# Patient Record
Sex: Male | Born: 1955 | Race: White | Hispanic: No | Marital: Married | Smoking: Former smoker
Health system: Southern US, Community
[De-identification: ages and names within clinical notes are randomized; demographics above are authoritative.]

## PROBLEM LIST (undated history)

## (undated) DIAGNOSIS — J449 Chronic obstructive pulmonary disease, unspecified: Secondary | ICD-10-CM

## (undated) DIAGNOSIS — M797 Fibromyalgia: Secondary | ICD-10-CM

## (undated) HISTORY — DX: Chronic obstructive pulmonary disease, unspecified: J44.9

## (undated) HISTORY — DX: Fibromyalgia: M79.7

---

## 1998-01-03 ENCOUNTER — Encounter: Admission: RE | Admit: 1998-01-03 | Discharge: 1998-01-03 | Payer: Self-pay | Admitting: *Deleted

## 2015-08-01 ENCOUNTER — Ambulatory Visit
Admission: RE | Admit: 2015-08-01 | Discharge: 2015-08-01 | Disposition: A | Discharge status: Home / Self Care | Payer: BLUE CROSS/BLUE SHIELD | Source: Ambulatory Visit | Attending: Otolaryngology | Admitting: Otolaryngology

## 2015-08-01 ENCOUNTER — Other Ambulatory Visit: Payer: Self-pay | Admitting: Otolaryngology

## 2015-08-01 DIAGNOSIS — J069 Acute upper respiratory infection, unspecified: Secondary | ICD-10-CM

## 2015-08-01 IMAGING — CR DG CHEST 2V
2 series · 2 of 2 positions shown · non-contrast
Comparison: None in PACs

CLINICAL DATA: Chest skin chest chin and productive cough for the
past week ; also shortness of breath and pleuritic right-sided chest
pain for the past 3 days. Current smoker.

EXAM:
CHEST  2 VIEW

[w chest pa]
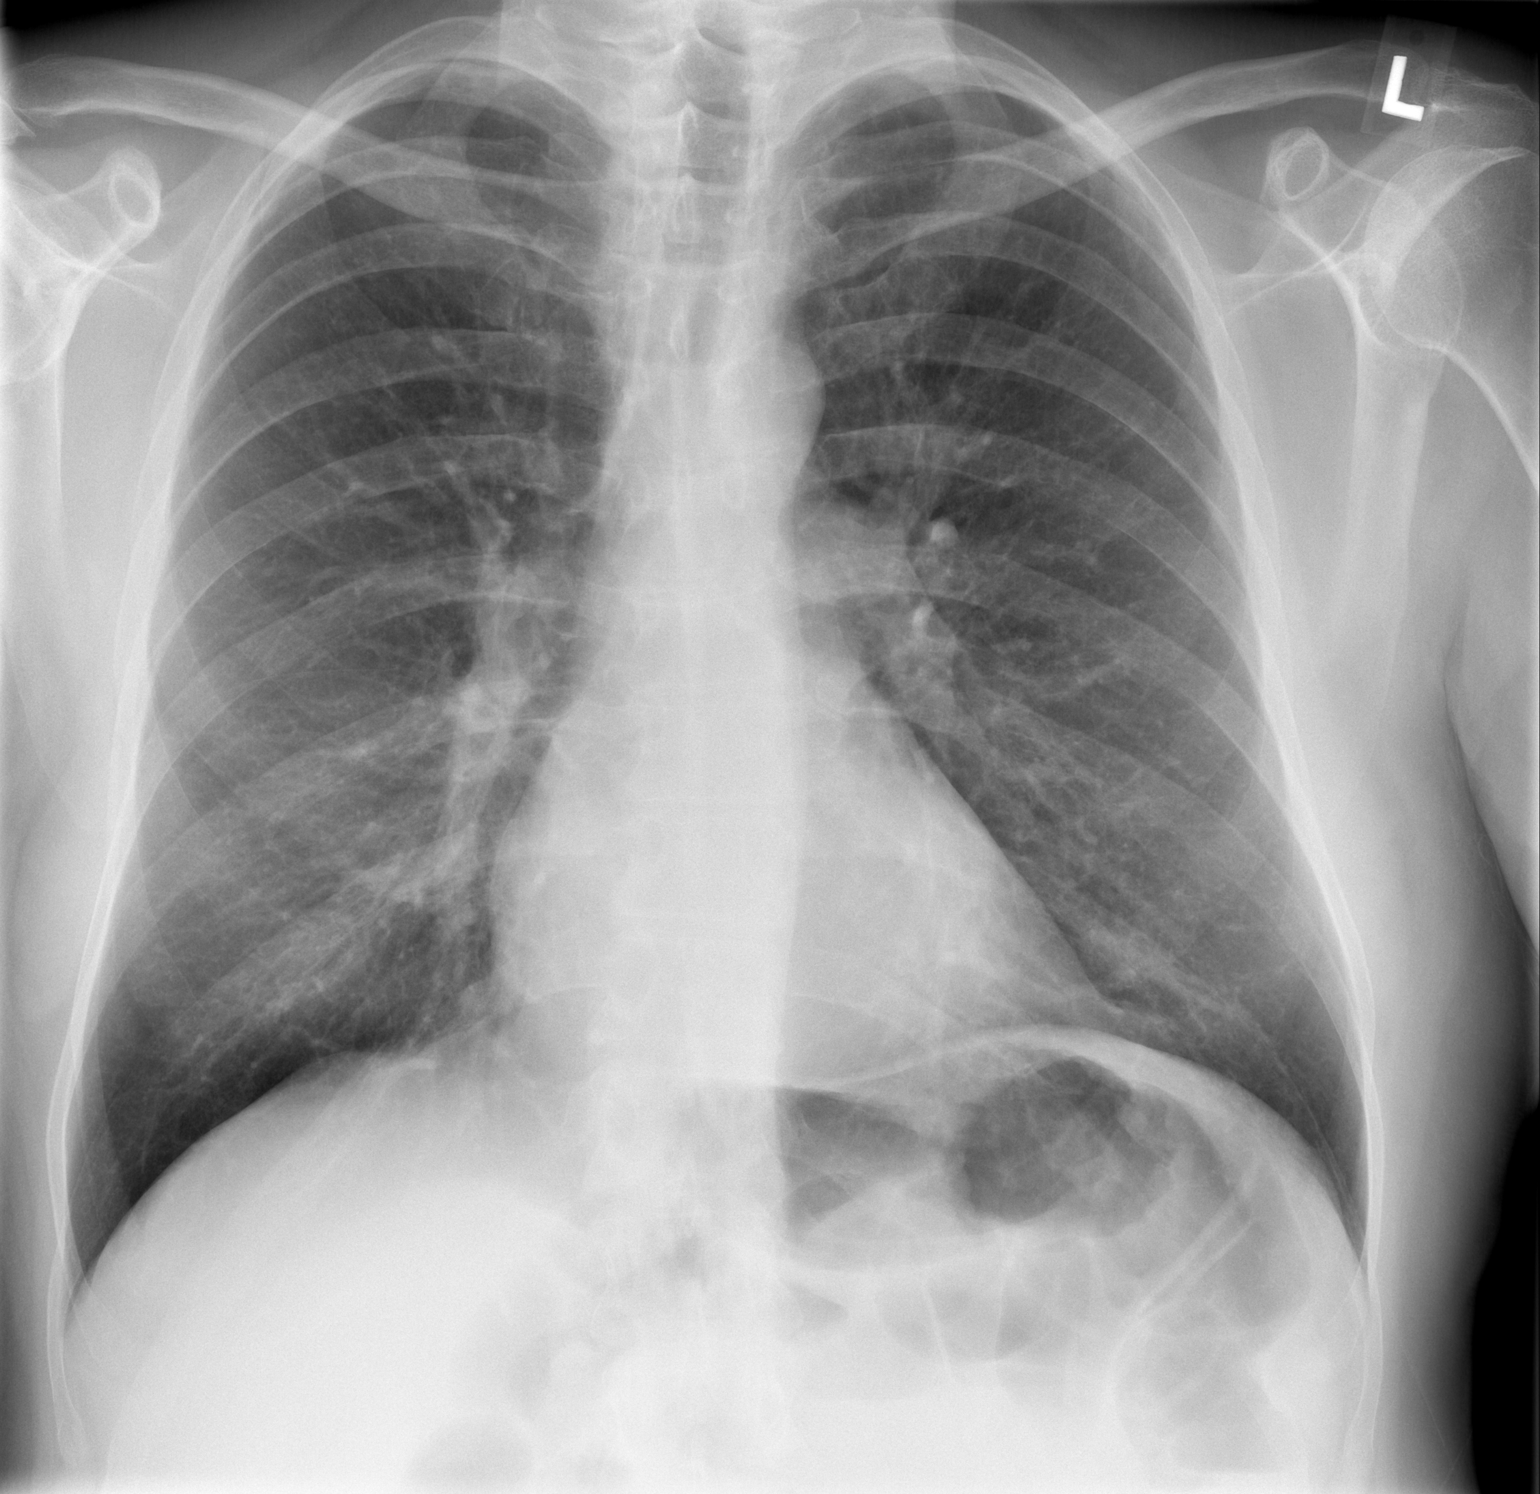

[w chest lat]
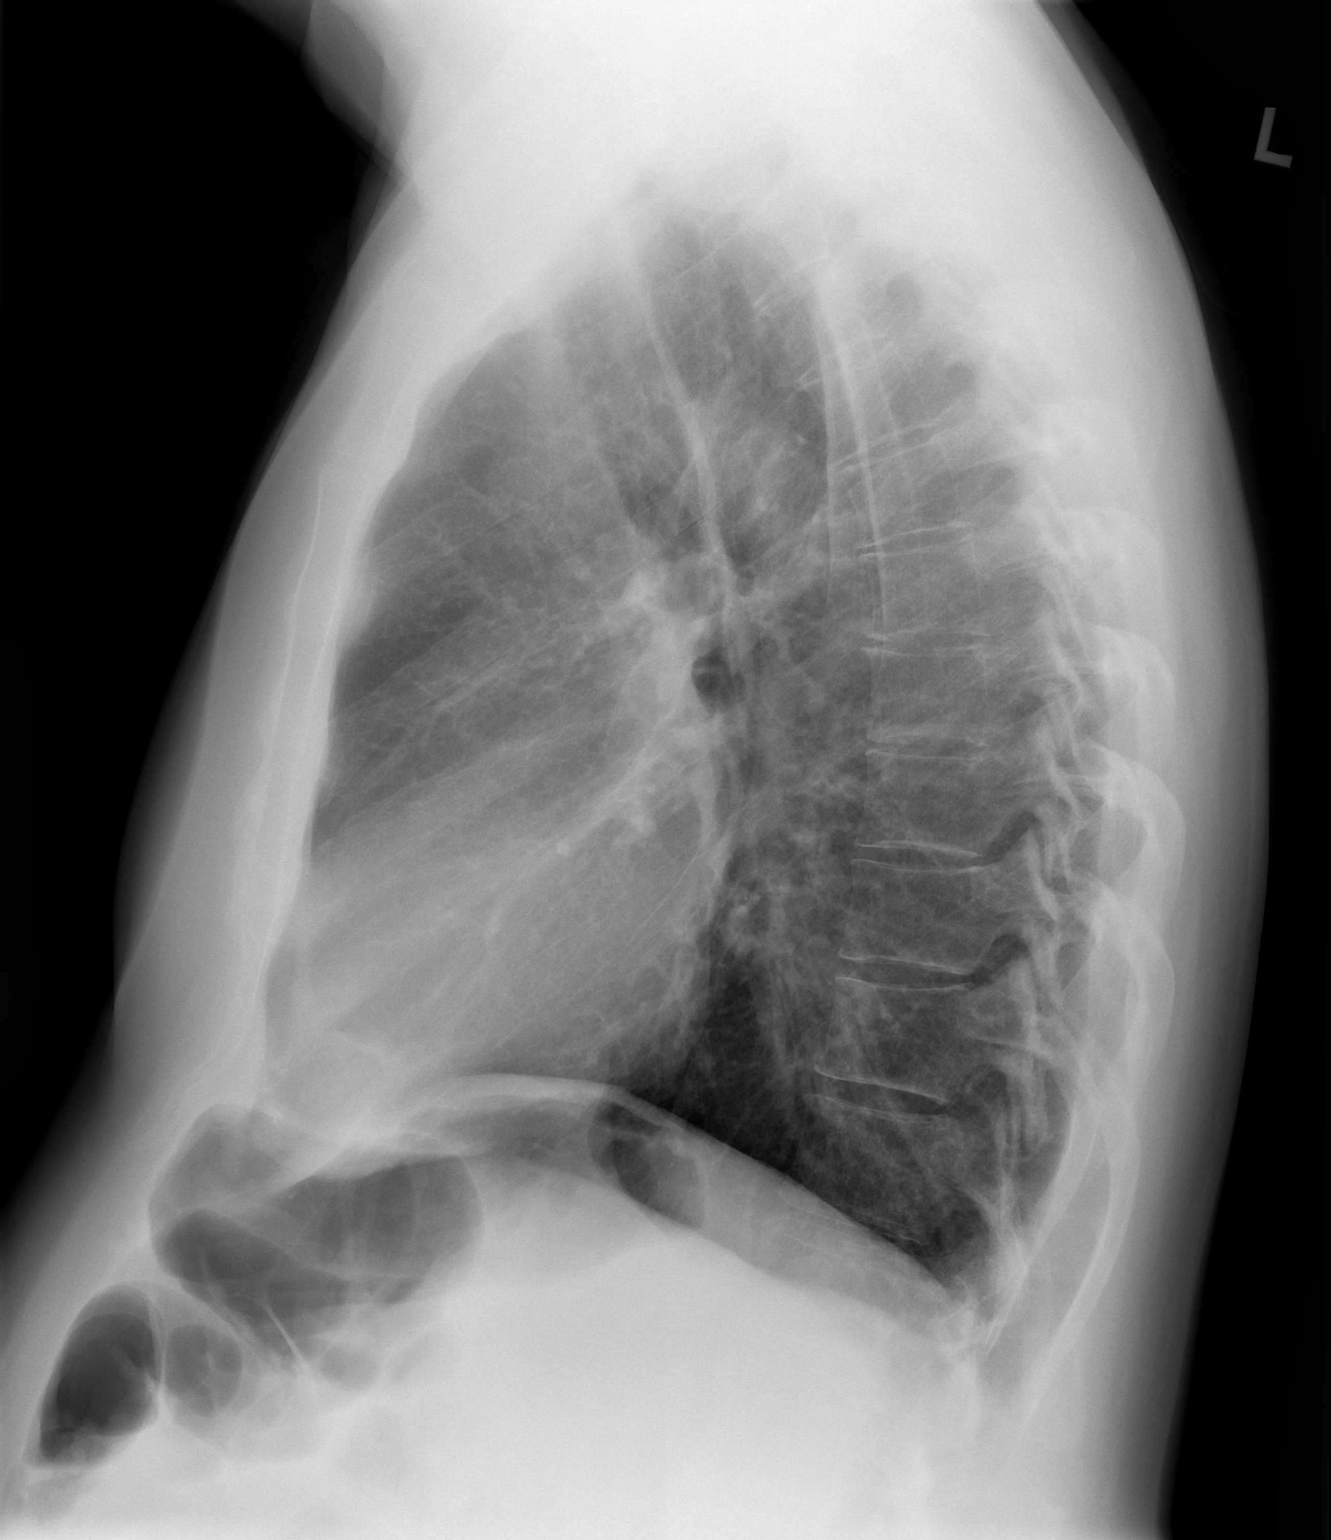

[2 of 2 positions shown; findings below may reference images not displayed]

FINDINGS: The lungs are well-expanded. There are coarse infrahilar lung
markings on the right and lingular density on the left. The heart
and pulmonary vascularity are normal. The mediastinum is normal in
width. There is no pleural effusion. The bony thorax exhibits no
acute abnormality.
IMPRESSION: Lingular and right infrahilar atelectasis or early pneumonia.
Probable underlying COPD.

Followup PA and lateral chest X-ray is recommended in 3-4 weeks
following trial of antibiotic therapy to ensure resolution and
exclude underlying malignancy.

## 2015-08-02 ENCOUNTER — Ambulatory Visit (INDEPENDENT_AMBULATORY_CARE_PROVIDER_SITE_OTHER): Payer: BLUE CROSS/BLUE SHIELD | Admitting: Internal Medicine

## 2015-08-02 ENCOUNTER — Encounter: Payer: Self-pay | Admitting: Internal Medicine

## 2015-08-02 VITALS — BP 114/80 | HR 104 | Ht 73.0 in | Wt 214.4 lb

## 2015-08-02 DIAGNOSIS — R058 Other specified cough: Secondary | ICD-10-CM

## 2015-08-02 DIAGNOSIS — R05 Cough: Secondary | ICD-10-CM

## 2015-08-02 DIAGNOSIS — J189 Pneumonia, unspecified organism: Secondary | ICD-10-CM

## 2015-08-02 DIAGNOSIS — J45901 Unspecified asthma with (acute) exacerbation: Secondary | ICD-10-CM

## 2015-08-02 DIAGNOSIS — F17210 Nicotine dependence, cigarettes, uncomplicated: Secondary | ICD-10-CM

## 2015-08-02 DIAGNOSIS — Z720 Tobacco use: Secondary | ICD-10-CM | POA: Diagnosis not present

## 2015-08-02 MED ORDER — MOMETASONE FURO-FORMOTEROL FUM 100-5 MCG/ACT IN AERO: INHALATION_SPRAY | RESPIRATORY_TRACT | Status: DC

## 2015-08-02 NOTE — Patient Instructions (Addendum)
Finish doxycycline as you plan   Dulera 100 Take 2 puffs first thing in am and then another 2 puffs about 12 hours later.   Work on inhaler technique:  relax and gently blow all the way out then take a nice smooth deep breath back in, triggering the inhaler at same time you start breathing in.  Hold for up to 5 seconds if you can. Blow out thru nose. Rinse and gargle with water when done   For cough > mucinex dm 1200 every 12 hours and rest your voice  The key is to stop smoking completely before smoking completely stops you!   If cough not better > Try prilosec otc 20mg   Take 30-60 min before first meal of the day and Pepcid ac (famotidine) 20 mg one @  bedtime until cough is completely gone for at least a week without the need for cough suppression   GERD (REFLUX)  is an extremely common cause of respiratory symptoms just like yours , many times with no obvious heartburn at all.    It can be treated with medication, but also with lifestyle changes including elevation of the head of your bed (ideally with 6 inch  bed blocks),  Smoking cessation, avoidance of late meals, excessive alcohol, and avoid fatty foods, chocolate, peppermint, colas, red wine, and acidic juices such as orange juice.  NO MINT OR MENTHOL PRODUCTS SO NO COUGH DROPS  USE SUGARLESS CANDY INSTEAD (Jolley ranchers or Stover's or Life Savers) or even ice chips will also do - the key is to swallow to prevent all throat clearing. NO OIL BASED VITAMINS - use powdered substitutes.   Please schedule a follow up office visit in 2  weeks, sooner if needed

## 2015-08-02 NOTE — Progress Notes (Signed)
Subjective:    Patient ID: Erik Chan, male    DOB: 1955/12/17  MRN: [id number]  HPI  63 yowm active smoker and basically healthy until around 2000 with persistent pattern since then of pnds/ sore throats s sob then around 2010 with more voice use noted onset of freqent intermittent "bronchitis" but then acutely worse since 07/23/15 bad cough rx zpak with slt yellow and second dose given by Dr  Haroldine Laws then seen 08/01/15 changed to doxy and referred to pulmonary clinic 08/02/2015 by Dr Haroldine Laws with ? pna/copd  08/02/2015 1st [facility] office visit/ Daylee Delahoz   Chief Complaint  Patient presents with  . Pulmonary Consult    Referred by Dr Hermelinda Medicus. Pt states he was dxed with PNA 08/01/15.  He c/o SOB for the past wk. He also c/o cough and chest congestion- mucinex has helped. Cough is occ prod with yellow to clear sputum.   symptoms of "constant daytime drainage" and throat irritation aggravated by voice use x years but never dx as copd or needed inhalers or really limited by breathing then acutely ill since 07/23/14 hacking cough/ worse hoarseness/ min yellow mucus production not much better with zpak  No obvious other patterns in day to day or daytime variabilty or assoc cp or chest tightness, subjective wheeze overt hb symptoms. No unusual exp hx or h/o childhood pna/ asthma or knowledge of premature birth.  Sleeping ok without nocturnal  or early am exacerbation  of respiratory  c/o's or need for noct saba. Also denies any obvious fluctuation of symptoms with weather or environmental changes or other aggravating or alleviating factors except as outlined above   Current Medications, Allergies, Complete Past Medical History, Past Surgical History, Family History, and Social History were reviewed in [employer] record.           Review of Systems  Constitutional: Negative for fever, chills, activity change, appetite change and unexpected weight change.    HENT: Positive for congestion and sneezing. Negative for dental problem, postnasal drip, rhinorrhea, sore throat, trouble swallowing and voice change.   Eyes: Negative for visual disturbance.  Respiratory: Positive for cough and shortness of breath. Negative for choking.   Cardiovascular: Negative for chest pain and leg swelling.  Gastrointestinal: Negative for nausea, vomiting and abdominal pain.  Genitourinary: Negative for difficulty urinating.  Musculoskeletal: Negative for arthralgias.  Skin: Negative for rash.  Psychiatric/Behavioral: Negative for behavioral problems and confusion.       Objective:   Physical Exam  amb wm nad very talkative and likes to use medical terms  Wt Readings from Last 3 Encounters:  08/02/15 214 lb 6.4 oz (97.251 kg)    Vital signs reviewed   HEENT: nl dentition, turbinates, and oropharynx. Nl external ear canals without cough reflex   NECK :  without JVD/Nodes/TM/ nl carotid upstrokes bilaterally   LUNGS: no acc muscle use,  Nl contour chest with late exp bilateral rhonchi/coughing fits  CV:  RRR  no s3 or murmur or increase in P2, no edema   ABD:  soft and nontender with nl inspiratory excursion in the supine position. No bruits or organomegaly, bowel sounds nl  MS:  Nl gait/ ext warm without deformities, calf tenderness, cyanosis or clubbing No obvious joint restrictions   SKIN: warm and dry without lesions    NEURO:  alert, approp, nl sensorium with  no motor deficits      I personally reviewed images and agree with radiology impression as follows:  CXR: 08/01/15 Lingular and right infrahilar atelectasis or early pneumonia. Probable underlying COPD.      Assessment & Plan:

## 2015-08-03 DIAGNOSIS — R058 Other specified cough: Secondary | ICD-10-CM | POA: Insufficient documentation

## 2015-08-03 DIAGNOSIS — J45901 Unspecified asthma with (acute) exacerbation: Secondary | ICD-10-CM | POA: Insufficient documentation

## 2015-08-03 DIAGNOSIS — J189 Pneumonia, unspecified organism: Secondary | ICD-10-CM | POA: Insufficient documentation

## 2015-08-03 DIAGNOSIS — R05 Cough: Secondary | ICD-10-CM | POA: Insufficient documentation

## 2015-08-03 DIAGNOSIS — F17210 Nicotine dependence, cigarettes, uncomplicated: Secondary | ICD-10-CM | POA: Insufficient documentation

## 2015-08-03 NOTE — Assessment & Plan Note (Signed)
Active smoker with recurrent "bronchitis" now with exp rhonchi on exam but really very little at this point to support sign copd > rec complete the doxy/ add low dose ics = dulera 100 2bid and regroup in 2 weeks  - The proper method of use, as well as anticipated side effects, of a metered-dose inhaler are discussed and demonstrated to the patient. Improved effectiveness after extensive coaching during this visit to a level of approximately 75 % from a baseline of 50 % \  Total time devoted to counseling  = 35/93m review case with pt/ discussion of options/alternatives/ personally creating written instructions  in presence of pt  then going over those specific  Instructions directly with the pt including how to use all of the meds but in particular covering each new medication in detail and the difference between the maintenance/automatic meds and the prns using an action plan format for the latter.

## 2015-08-03 NOTE — Assessment & Plan Note (Signed)

## 2015-08-03 NOTE — Assessment & Plan Note (Signed)
Onset 07/23/15  With RML/lingular linear densities only   This is not classic pna/ doxy should be fine with f/u cxr planned  Reviewed anatomic issues which limit rapid resolution of infiltrates in this setting

## 2015-08-03 NOTE — Assessment & Plan Note (Signed)
Classic Upper airway cough syndrome, so named because it's frequently impossible to sort out how much is  CR/sinusitis with freq throat clearing (which can be related to primary GERD)   vs  causing  secondary (" extra esophageal")  GERD from wide swings in gastric pressure that occur with throat clearing, often  promoting self use of mint and menthol lozenges that reduce the lower esophageal sphincter tone and exacerbate the problem further in a cyclical fashion.   These are the same pts (now being labeled as having "irritable larynx syndrome" by some cough centers) who not infrequently have a history of having failed to tolerate ace inhibitors,  dry powder inhalers or biphosphonates or report having atypical reflux symptoms that don't respond to standard doses of PPI , and are easily confused as having aecopd or asthma flares by even experienced allergists/ pulmonologists.  Add gerd rx/ acid suppression/ reviewed cyclical cough mechanics and need to suppress throat clearing/ rest voice

## 2015-08-16 ENCOUNTER — Ambulatory Visit: Payer: BLUE CROSS/BLUE SHIELD | Admitting: Internal Medicine

## 2016-03-07 ENCOUNTER — Telehealth: Payer: Self-pay | Admitting: Internal Medicine

## 2016-03-07 NOTE — Telephone Encounter (Signed)
Spoke with the pt  He states needing referral to rheum for fibromyalgia  I advised to call his PCP  He states that he does not have one  I advised he can try and call [place] rhe[facility] and see if they can make the appt himself  He verbalized understanding  Nothing further needed

## 2017-12-11 ENCOUNTER — Ambulatory Visit
Admission: RE | Admit: 2017-12-11 | Discharge: 2017-12-11 | Disposition: A | Discharge status: Home / Self Care | Payer: BLUE CROSS/BLUE SHIELD | Source: Ambulatory Visit | Attending: Family Medicine | Admitting: Family Medicine

## 2017-12-11 ENCOUNTER — Other Ambulatory Visit: Payer: Self-pay | Admitting: Family Medicine

## 2017-12-11 DIAGNOSIS — J209 Acute bronchitis, unspecified: Secondary | ICD-10-CM

## 2017-12-11 IMAGING — CR DG CHEST 2V
2 series · 2 of 2 positions shown · non-contrast
Comparison: [DATE]

CLINICAL DATA: Cough

EXAM:
CHEST - 2 VIEW

[w chest pa]
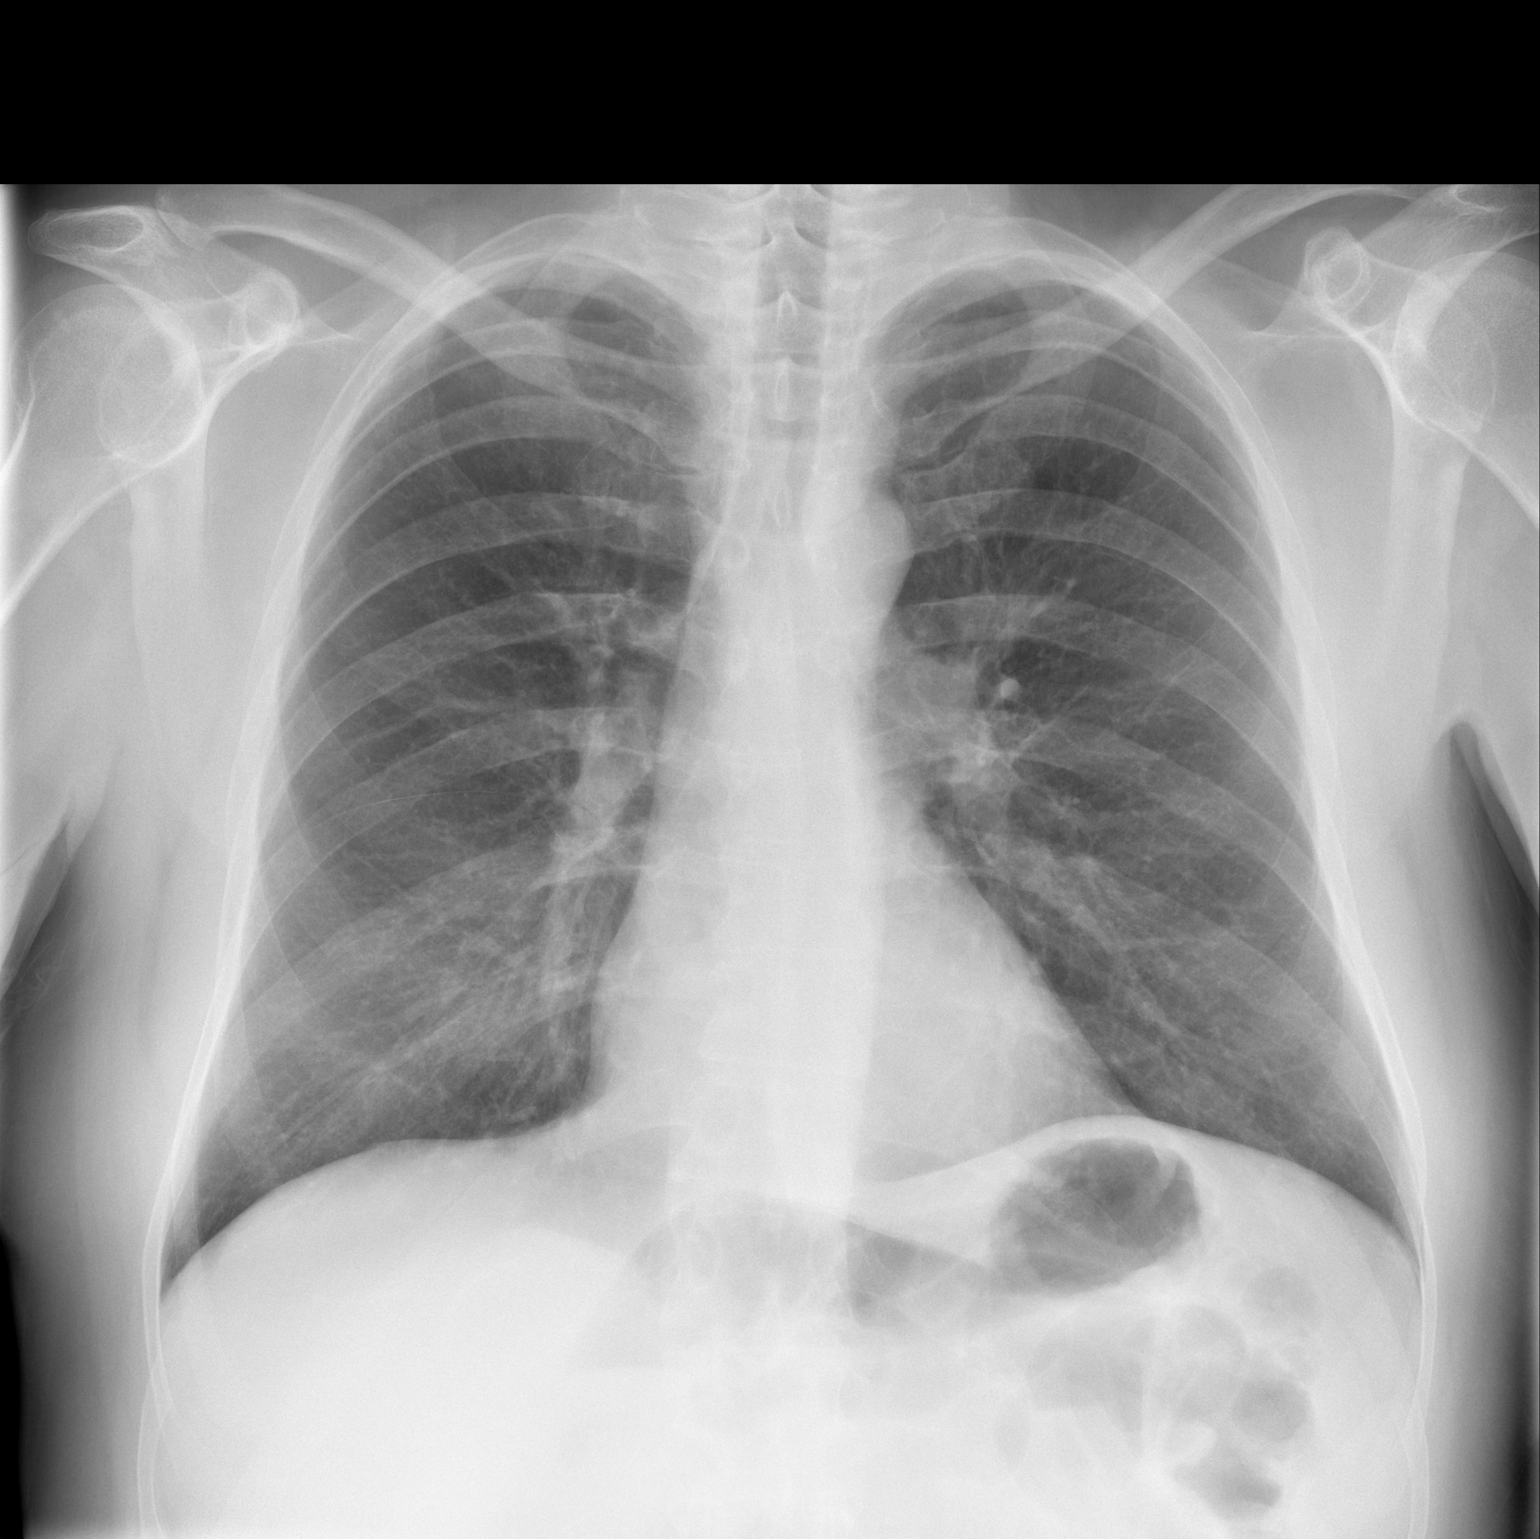

[w chest lat]
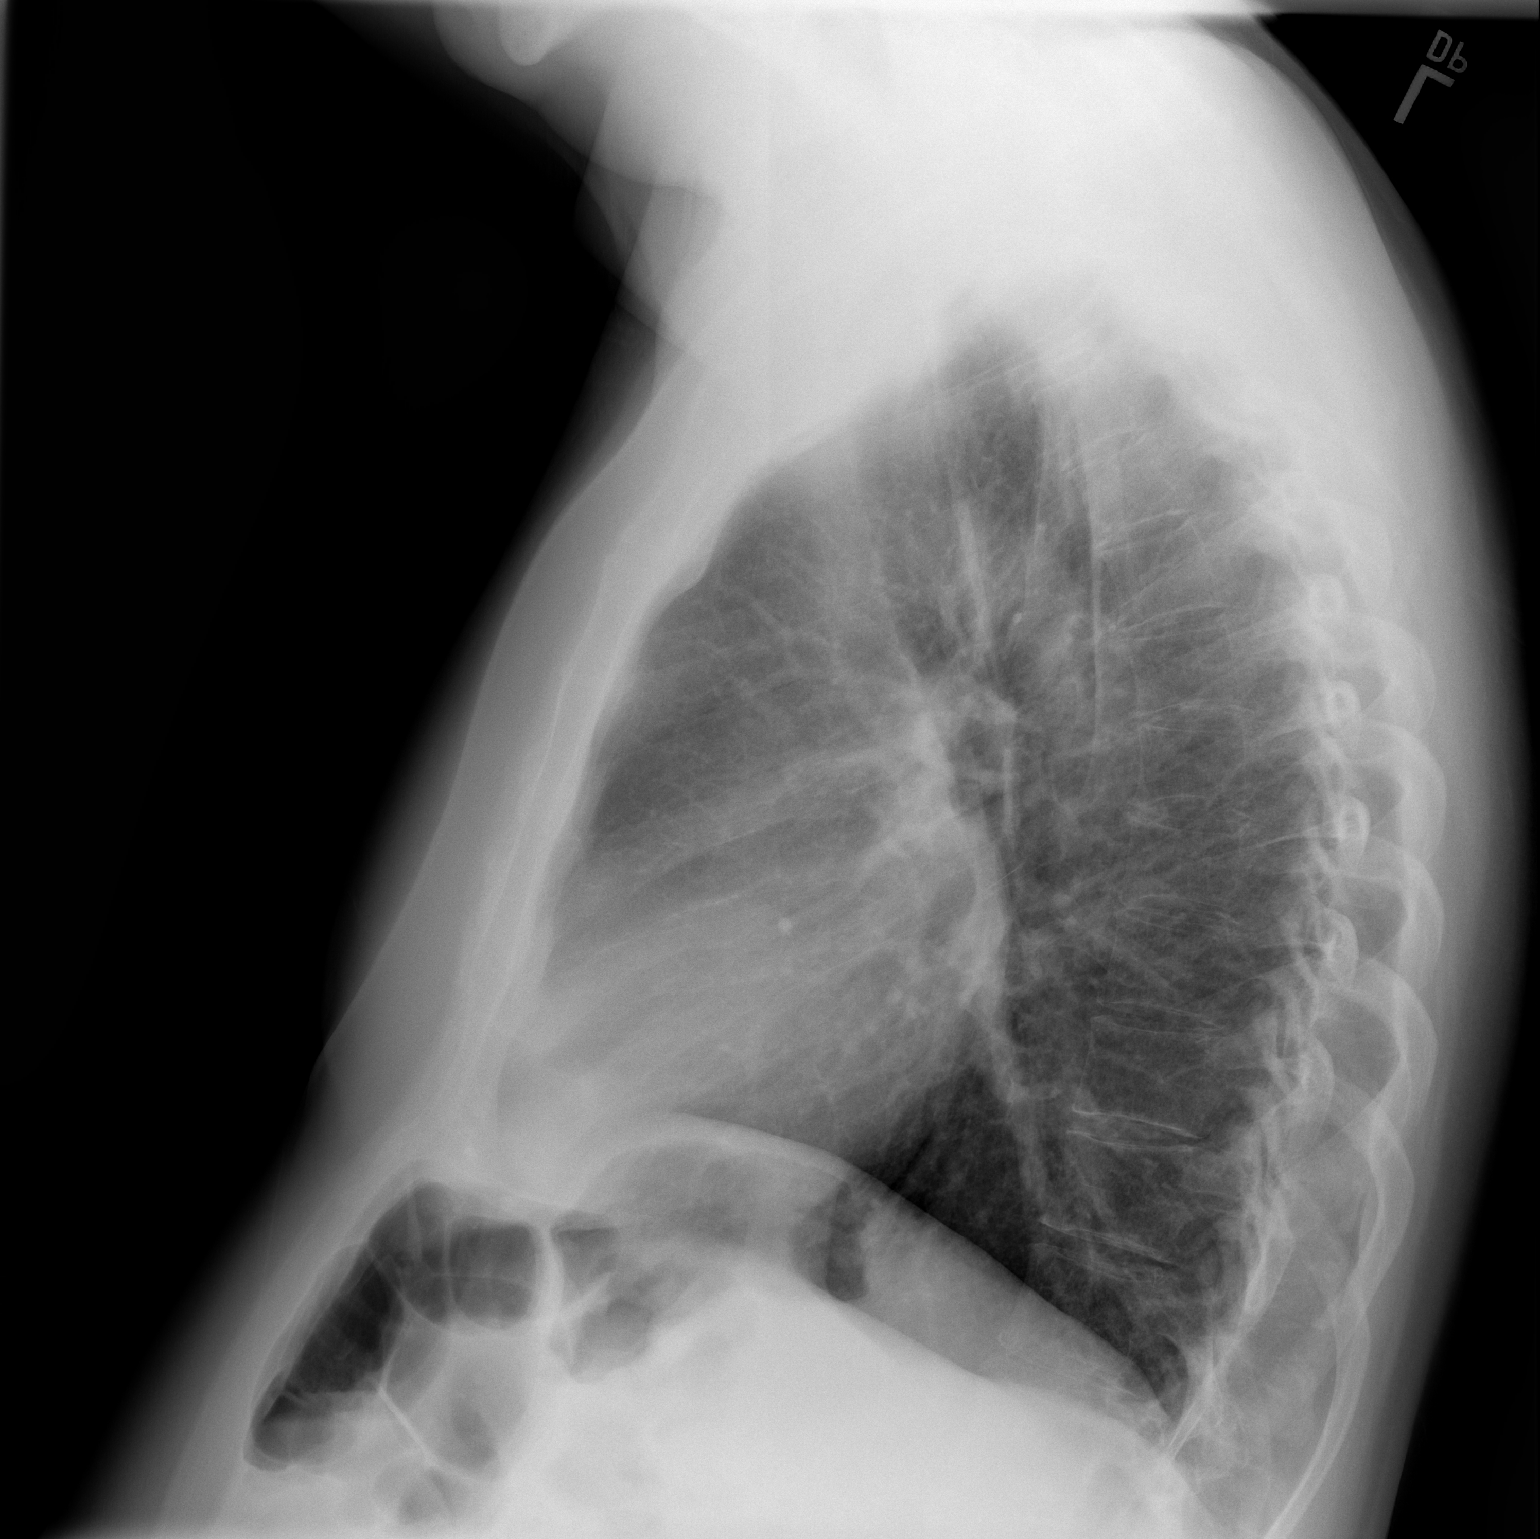

[2 of 2 positions shown; findings below may reference images not displayed]

FINDINGS: There is no edema or consolidation. The heart size and pulmonary
vascularity are normal. No adenopathy. No bone lesions.
IMPRESSION: No edema or consolidation.

## 2021-01-11 DIAGNOSIS — R0602 Shortness of breath: Secondary | ICD-10-CM | POA: Diagnosis not present

## 2021-01-11 DIAGNOSIS — Z8639 Personal history of other endocrine, nutritional and metabolic disease: Secondary | ICD-10-CM | POA: Diagnosis not present

## 2021-01-11 DIAGNOSIS — J449 Chronic obstructive pulmonary disease, unspecified: Secondary | ICD-10-CM | POA: Diagnosis not present

## 2021-01-11 DIAGNOSIS — R059 Cough, unspecified: Secondary | ICD-10-CM | POA: Diagnosis not present

## 2021-01-28 DIAGNOSIS — R609 Edema, unspecified: Secondary | ICD-10-CM | POA: Diagnosis not present

## 2021-01-28 DIAGNOSIS — R0602 Shortness of breath: Secondary | ICD-10-CM | POA: Diagnosis not present

## 2021-01-28 DIAGNOSIS — Z87891 Personal history of nicotine dependence: Secondary | ICD-10-CM | POA: Diagnosis not present

## 2021-01-29 ENCOUNTER — Other Ambulatory Visit: Payer: Self-pay | Admitting: Family Medicine

## 2021-01-29 DIAGNOSIS — R7989 Other specified abnormal findings of blood chemistry: Secondary | ICD-10-CM

## 2021-01-30 ENCOUNTER — Other Ambulatory Visit (HOSPITAL_COMMUNITY): Payer: Self-pay | Admitting: Family Medicine

## 2021-01-30 ENCOUNTER — Ambulatory Visit
Admission: RE | Admit: 2021-01-30 | Discharge: 2021-01-30 | Disposition: A | Discharge status: Home / Self Care | Payer: BLUE CROSS/BLUE SHIELD | Source: Ambulatory Visit | Attending: Family Medicine | Admitting: Family Medicine

## 2021-01-30 ENCOUNTER — Other Ambulatory Visit: Payer: Self-pay

## 2021-01-30 DIAGNOSIS — N5089 Other specified disorders of the male genital organs: Secondary | ICD-10-CM | POA: Diagnosis not present

## 2021-01-30 DIAGNOSIS — R7989 Other specified abnormal findings of blood chemistry: Secondary | ICD-10-CM

## 2021-01-30 DIAGNOSIS — R000 Tachycardia, unspecified: Secondary | ICD-10-CM | POA: Diagnosis not present

## 2021-01-30 DIAGNOSIS — R601 Generalized edema: Secondary | ICD-10-CM | POA: Diagnosis not present

## 2021-01-30 DIAGNOSIS — K8020 Calculus of gallbladder without cholecystitis without obstruction: Secondary | ICD-10-CM | POA: Diagnosis not present

## 2021-01-30 IMAGING — US US ABDOMEN COMPLETE
1 series · 13 of 25 positions shown · non-contrast
Comparison: None.

CLINICAL DATA: Elevated LFTs.  Edema, possible ascites.

EXAM:
ABDOMEN ULTRASOUND COMPLETE

[Series 1: us abdomen complete · 0.23mm/px · 13 of 96 slices shown]
[im 1/96]
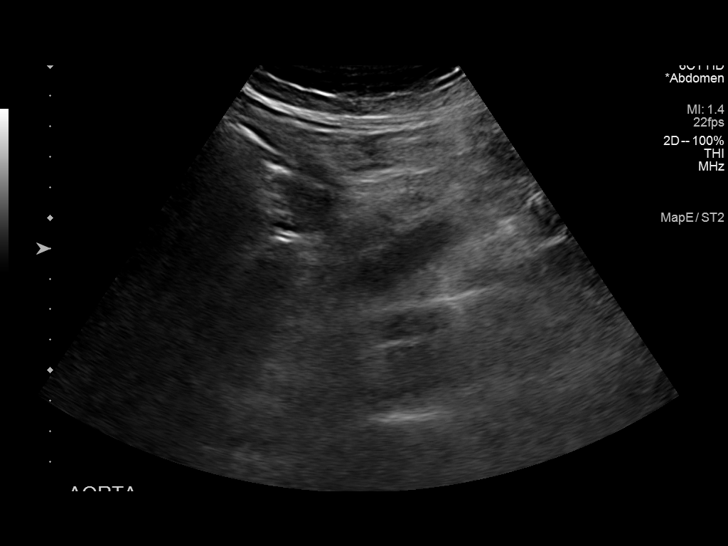
[im 8/96]
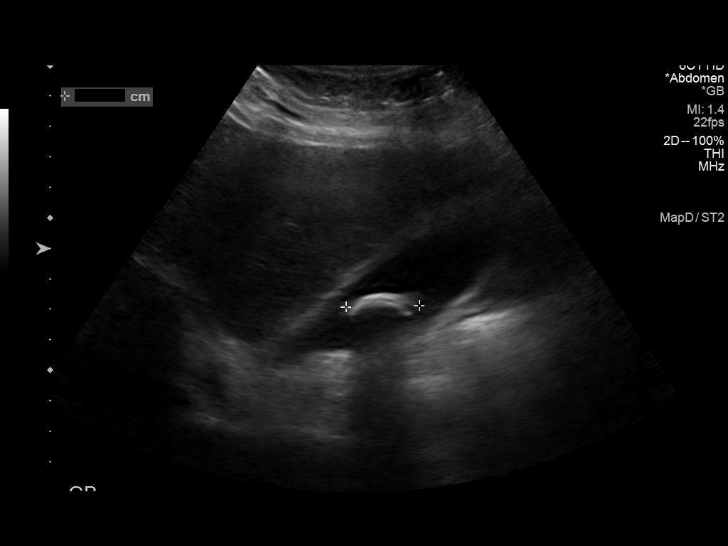
[im 16/96]
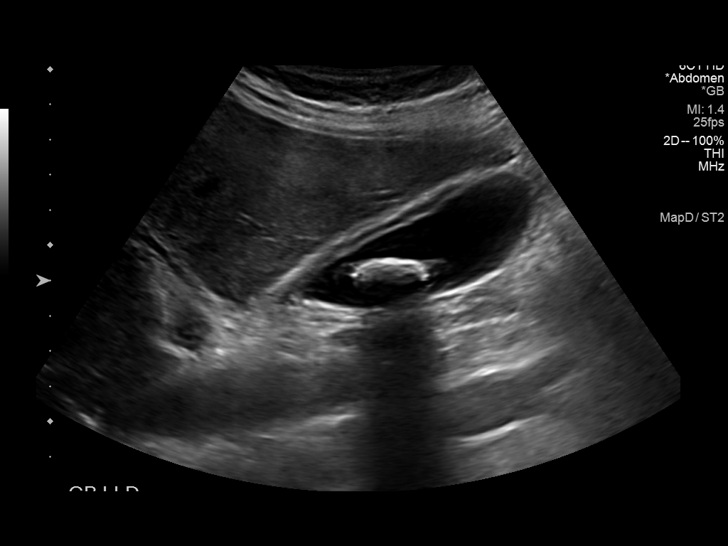
[im 24/96]
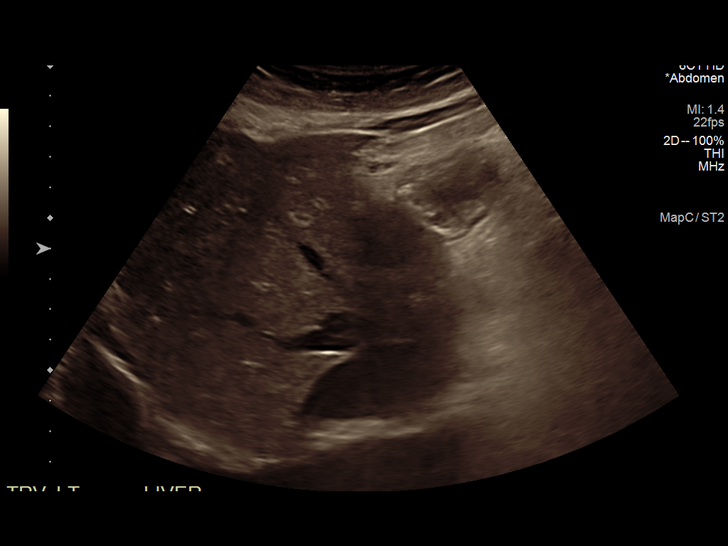
[im 32/96]
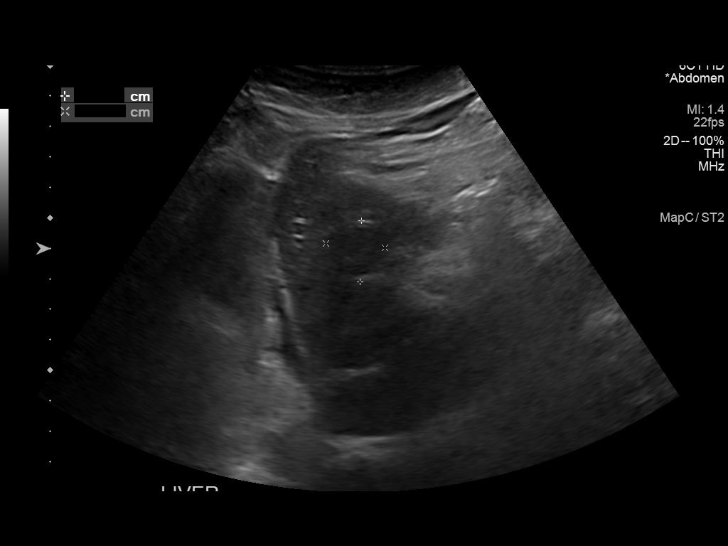
[im 40/96]
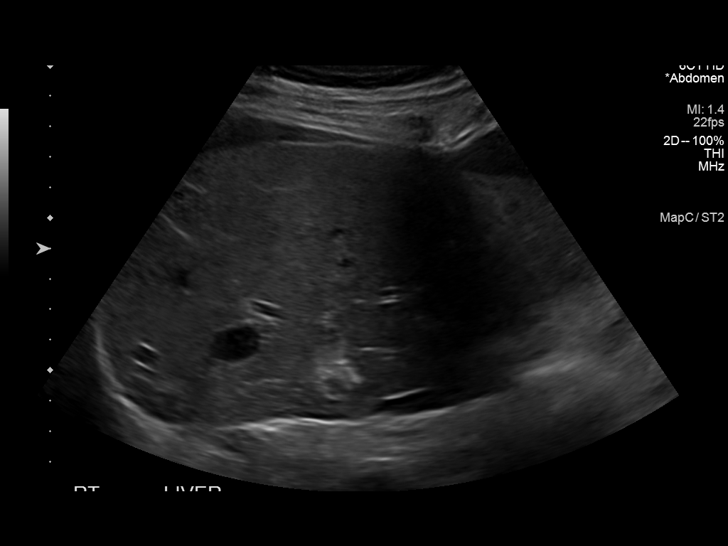
[im 48/96]
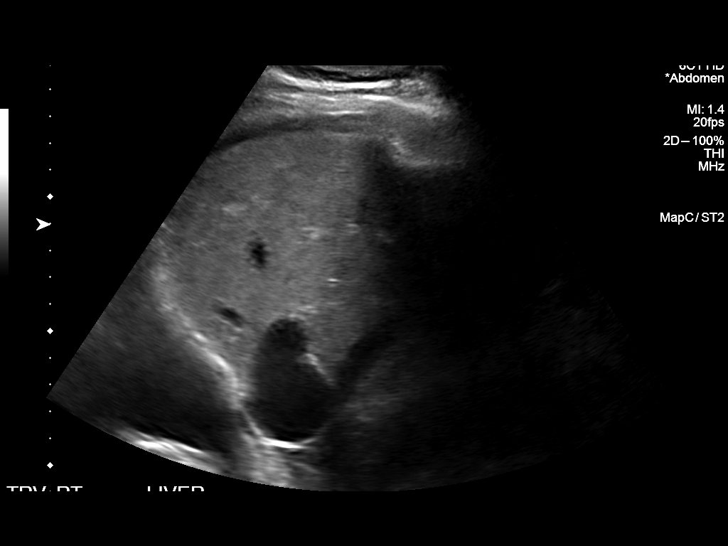
[im 56/96]
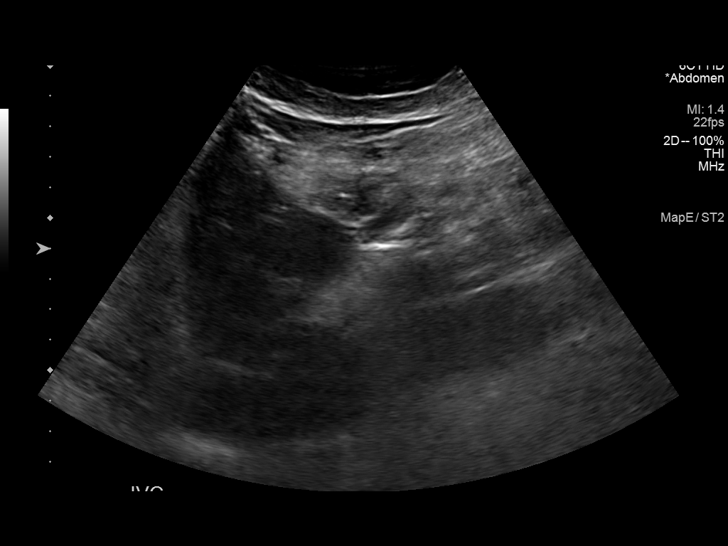
[im 64/96]
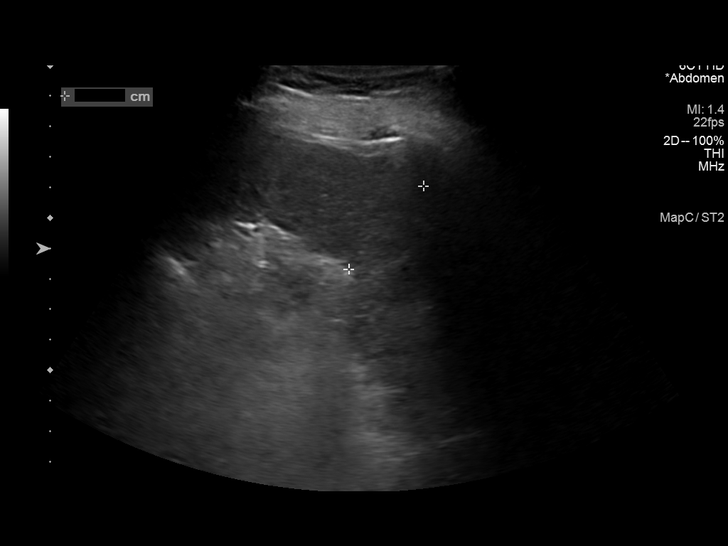
[im 72/96]
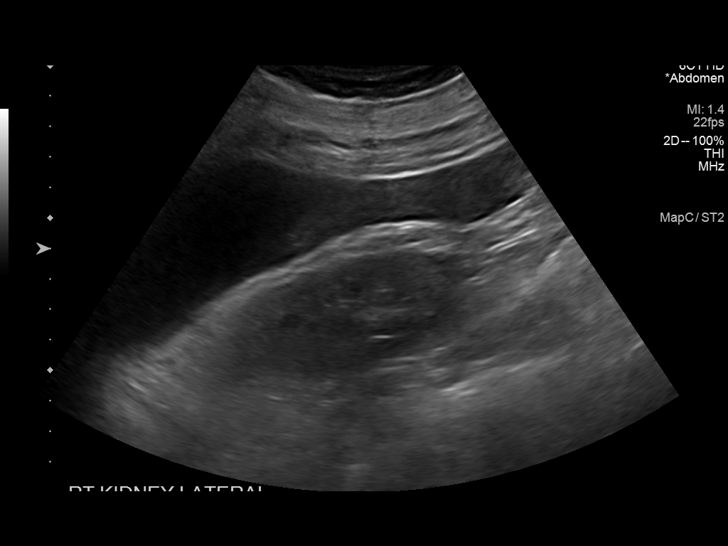
[im 80/96]
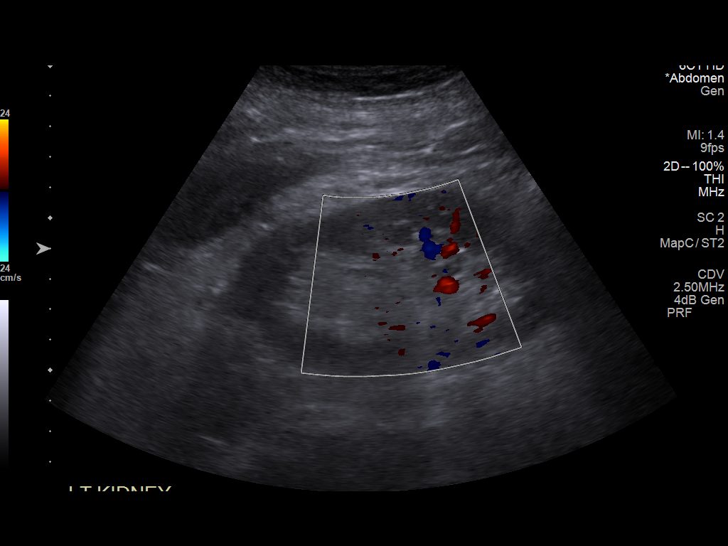
[im 88/96]
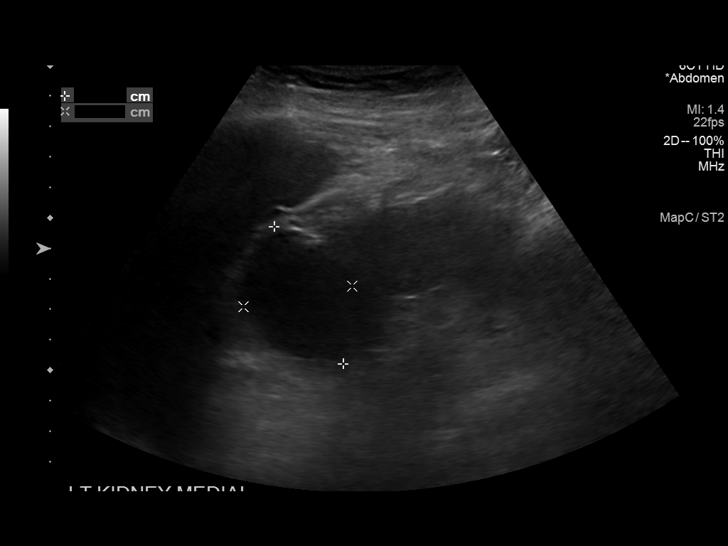
[im 96/96]
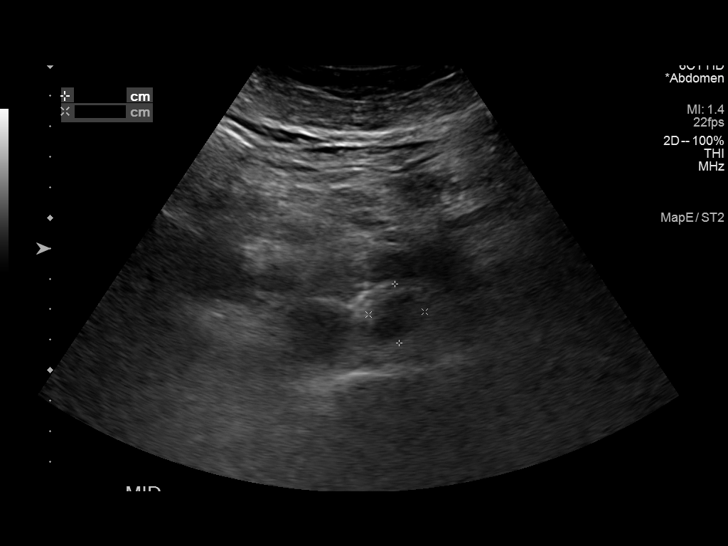

[13 of 25 positions shown; findings below may reference images not displayed]

FINDINGS: Gallbladder: 2.4 cm shadowing gallstone. Focal wall thickening
measuring up to 5 mm, no pericholecystic fluid, sonographic Murphy's
sign was not negative.

Common bile duct: Diameter: 4

Liver: Hypoechoic area measuring 2.0 x 1.9 cm. Within normal limits
in parenchymal echogenicity. Portal vein is patent on color Doppler
imaging with normal direction of blood flow towards the liver.

IVC: No abnormality visualized.

Pancreas: Visualized portion unremarkable.

Spleen: Size and appearance within normal limits.

Right Kidney: Length: 11.9. Echogenicity within normal limits. No
mass or hydronephrosis visualized.

Left Kidney: Length: 11.7. Echogenicity within normal limits.
Anechoic avascular cystic structure in the upper pole measuring up
to 5 cm. No mass or hydronephrosis visualized.

Abdominal aorta: No aneurysm visualized.

Other findings: Small amount perihepatic ascites.
IMPRESSION: 1. Hypoechoic structure in the left hepatic lobe measuring 2.0 x
cm, it may be focal fatty sparing or hepatic mass. Follow-up
examination with MR would be helpful.

2. Cholelithiasis with gallbladder wall thickening, nonspecific and
may represent chronic cholecystitis.

3.  Small ascites.

## 2021-02-01 ENCOUNTER — Other Ambulatory Visit: Payer: Self-pay

## 2021-02-01 ENCOUNTER — Encounter: Payer: Self-pay | Admitting: Cardiovascular Disease

## 2021-02-01 ENCOUNTER — Ambulatory Visit: Payer: Medicare Other | Admitting: Cardiovascular Disease

## 2021-02-01 VITALS — BP 110/60 | HR 99 | Ht 74.0 in | Wt 219.4 lb

## 2021-02-01 DIAGNOSIS — I5043 Acute on chronic combined systolic (congestive) and diastolic (congestive) heart failure: Secondary | ICD-10-CM | POA: Diagnosis not present

## 2021-02-01 DIAGNOSIS — R7989 Other specified abnormal findings of blood chemistry: Secondary | ICD-10-CM

## 2021-02-01 DIAGNOSIS — R600 Localized edema: Secondary | ICD-10-CM | POA: Diagnosis not present

## 2021-02-01 DIAGNOSIS — R0602 Shortness of breath: Secondary | ICD-10-CM | POA: Diagnosis not present

## 2021-02-01 DIAGNOSIS — K8020 Calculus of gallbladder without cholecystitis without obstruction: Secondary | ICD-10-CM

## 2021-02-01 DIAGNOSIS — R188 Other ascites: Secondary | ICD-10-CM

## 2021-02-01 DIAGNOSIS — Z720 Tobacco use: Secondary | ICD-10-CM

## 2021-02-01 MED ORDER — FUROSEMIDE 40 MG PO TABS: ORAL_TABLET | ORAL | 1 refills | Status: DC

## 2021-02-01 MED ORDER — METOPROLOL SUCCINATE ER 25 MG PO TB24: ORAL_TABLET | ORAL | 1 refills | Status: DC

## 2021-02-01 NOTE — Patient Instructions (Signed)
Medication Instructions:  Tomorrow- take 1.5 tablet (60 mg) in the AM, and 1 tablet (40 mg) in the PM. Then after that take 1 tablet (40 mg) in the AM, and 1 tablet (40 mg) in the PM.   Start Metoprolol Succinate 25 mg (take 0.5 tablet- 12.5 mg for the first 4 days, then increase to 1 tablet- 25 mg daily)   *If you need a refill on your cardiac medications before your next appointment, please call your pharmacy*   Lab Work: BNP, CMET, AMALAYSE, LIPID today  If you have labs (blood work) drawn today and your tests are completely normal, you will receive your results only by: MyChart Message (if you have MyChart) OR A paper copy in the mail If you have any lab test that is abnormal or we need to change your treatment, we will call you to review the results.   Testing/Procedures: Echocardiogram (STAT) - Your physician has requested that you have an echocardiogram. Echocardiography is a painless test that uses sound waves to create images of your heart. It provides your doctor with information about the size and shape of your heart and how well your hearts chambers and valves are working. This procedure takes approximately one hour. There are no restrictions for this procedure. This will be performed at either our [facility] location - [address], [address]  -or- [facility] location [facility] 2nd floor.   Chest xray - Your physician has requested that you have a chest xray, is a fast and painless imaging test that uses certain electromagnetic waves to create pictures of the structures in and around your chest. This test can help diagnose and monitor conditions such as pneumonia and other lung issues his will be done at [facility] [address], [place]. If you should need to call them their phone number is [phone].    Follow-Up: At [facility], you and your health needs are our priority.  As part of our continuing mission to provide you with  exceptional heart care, we have created designated Provider Care Teams.  These Care Teams include your primary Cardiologist (physician) and Advanced Practice Providers (APPs -  Physician Assistants and Nurse Practitioners) who all work together to provide you with the care you need, when you need it.  We recommend signing up for the patient portal called "MyChart".  Sign up information is provided on this After Visit Summary.  MyChart is used to connect with patients for Virtual Visits (Telemedicine).  Patients are able to view lab/test results, encounter notes, upcoming appointments, etc.  Non-urgent messages can be sent to your provider as well.   To learn more about what you can do with MyChart, go to [email or web address].    Your next appointment:   3 week(s)  The format for your next appointment:   In Person  Provider:   Marjie Skiff, PA-C or Azalee Course, PA-C    Then, None will plan to see you again in 6 week(s).    Other Instructions How to Use Compression Stockings Compression stockings are elastic socks that help increase blood flow (circulation) to the legs, decrease swelling in the legs, and reduce the chance of developing blood clots in the lower legs. Compression stockings squeeze or apply pressure to the legs. The stockings are graduated, meaning the highest amount of pressure occurs at the toes and it decreases going toward the upper part of the leg. This helps ensure proper circulation through the veins. Compression stockings are  often used by people who: Are recovering from surgery. The stockings help prevent blood clots after surgery. Have poor circulation or swelling in their legs because of a medical condition, such as chronic venous insufficiency, venous stasis, or lymphedema. Have a history of getting blood clots in their legs. Have bulging (varicose) veins. Sit or stay in bed for long periods of time (immobilization). Stand for long periods of time and experience leg  pain or fatigue. Follow instructions from your health care provider about how and when to wear your compression stockings. What are the risks? Generally, compression stockings are safe to wear. However, problems may occur for some people, such as: The stockings being ineffective at increasing the circulation to the legs, decreasing swelling in the legs, or reducing the chance of developing blood clots in the lower legs. Skin complications, including breaks in the skin, open wounds, blisters, or dermatitis. How to wear compression stockings Before you put on your compression stockings: Make sure that they are the correct size and degree of compression. If you do not know your size or required grade of compression, ask your health care provider and follow the manufacturer's instructions that come with the stockings. Be sure they are the appropriate length for your medical needs. Compression stockings come in different lengths, including knee high, thigh high, and even up to the waist. Make sure that the stockings are clean, dry, and in good condition. Check the stockings for rips and tears. Do not put them on if they are ripped or torn. Put your stockings on first thing in the morning, before you get out of bed. Keep them on for as long as your health care provider advises. Most people are told to remove their compression stockings at the end of the day before bed. When you are wearing your stockings: Keep them as smooth as possible. Do not allow them to bunch up. It is especially important to prevent the stockings from bunching up around your toes or behind your knees. Make sure that the toe holes are underneath the toes and the heel patches are positioned at the heels. Do not roll the stockings downward and leave them rolled down. This can decrease blood flow to your legs. Change the stockings right away if they become wet or dirty or if they have a bad smell. If you have chronic leg wounds, make  sure the wounds are properly covered or dressed before putting on your compression stockings. When you take off your stockings, check your legs and feet for: Open sores. Red spots or other areas of discoloration. Swelling. General tips Do not stop wearing compression stockings. Talk to your health care provider if your stockings feel too tight. Wash your stockings often with mild detergent in cold or warm water. Also wash them whenever they get dirty or have a bad smell. Do not use bleach. Air-dry your stockings or dry them in a clothes dryer on low heat. It may be helpful to have two pairs so that you have a pair to wear while the other is being washed. Replace your stockings every 3-6 months. If skin moisturizing is part of your treatment plan, apply lotion or cream at night so that your skin will be dry when you put on the stockings in the morning. It is harder to put the stockings on when you have lotion on your legs or feet. Wear nonskid shoes or slip-resistant socks when walking while wearing compression stockings. If you have difficulty putting on or taking  off the compression stockings, ask your health care provider about devices that may help make this easier. Contact a health care provider and remove your stockings if: You have a prickling or tingling feeling in your feet or legs. You have new open sores, red spots, or other skin changes on your feet or legs. You have swelling or pain that gets worse. Get help right away if: You have shortness of breath or chest pain. Your heartbeat is fast or irregular. You have new swelling, pain, or warmth in your leg. You have numbness or tingling in your lower legs that does not get better after you take the stockings off. Your toes or feet are unusually cold or turn a bluish color. You feel light-headed or dizzy. These symptoms may represent a serious problem that is an emergency. Do not wait to see if the symptoms will go away. Get medical  help right away. Call your local emergency services (911 in the [place] Do not drive yourself to the hospital. Summary Compression stockings are elastic socks that are worn to treat a variety of symptoms and medical conditions such as venous insufficiency, venous stasis, or lymphedema. Compression stockings help increase blood flow (circulation) to the legs, decrease swelling in the legs, and reduce the chance of developing blood clots in the lower legs. Follow instructions from your health care provider about how and when to wear your compression stockings. Do not stop wearing your compression stockings without talking to your health care provider first. This information is not intended to replace advice given to you by your health care provider. Make sure you discuss any questions you have with your health care provider. Document Revised: 07/19/2020 Document Reviewed: 07/19/2020 Elsevier Patient Education  [employer].

## 2021-02-01 NOTE — Progress Notes (Incomplete)
Cardiology Office Note    Date:  02/01/2021   ID:  Erik Chan, DOB 1955-03-16, MRN [id number]  PCP:  Kathyrn Lass, MD  Cardiologist:  Shelva Majestic, MD   No chief complaint on file.   History of Present Illness:  Erik Chan is a 65 y.o. male ***    No past medical history on file.  *** The histories are not reviewed yet. Please review them in the "History" navigator section and refresh this [place].  Current Medications: Outpatient Medications Prior to Visit  Medication Sig Dispense Refill   furosemide (LASIX) 40 MG tablet Take 40 mg by mouth daily.     albuterol (VENTOLIN HFA) 108 (90 Base) MCG/ACT inhaler Inhale into the lungs.     doxycycline (VIBRA-TABS) 100 MG tablet Take 100 mg by mouth 2 (two) times daily. (Patient not taking: Reported on 02/01/2021)     guaiFENesin (MUCINEX) 600 MG 12 hr tablet Take 600 mg by mouth 2 (two) times daily as needed. (Patient not taking: Reported on 02/01/2021)     Lactobacillus (LACTINEX PO) Take 1 tablet by mouth daily. (Patient not taking: Reported on 02/01/2021)     mometasone-formoterol (DULERA) 100-5 MCG/ACT AERO Take 2 puffs first thing in am and then another 2 puffs about 12 hours later. (Patient not taking: Reported on 02/01/2021)     No facility-administered medications prior to visit.     Allergies:   Shellfish-derived products   Social History   Socioeconomic History   Marital status: Married    Spouse name: Not on file   Number of children: Not on file   Years of education: Not on file   Highest education level: Not on file  Occupational History   Occupation: Banker   Tobacco Use   Smoking status: Every Day    Packs/day: 1.00    Years: 30.00    Pack years: 30.00    Types: Pipe, Cigarettes   Smokeless tobacco: Never  Substance and Sexual Activity   Alcohol use: No    Alcohol/week: 0.0 standard drinks   Drug use: No   Sexual activity: Not on file  Other Topics Concern   Not on file  Social  History Narrative   Not on file   Social Determinants of Health   Financial Resource Strain: Not on file  Food Insecurity: Not on file  Transportation Needs: Not on file  Physical Activity: Not on file  Stress: Not on file  Social Connections: Not on file     Family History:  The patient's ***family history includes Allergies in his mother; Asthma in his mother.   ROS General: Negative; No fevers, chills, or night sweats;  HEENT: Negative; No changes in vision or hearing, sinus congestion, difficulty swallowing Pulmonary: Negative; No cough, wheezing, shortness of breath, hemoptysis Cardiovascular: Negative; No chest pain, presyncope, syncope, palpitations GI: Negative; No nausea, vomiting, diarrhea, or abdominal pain GU: Negative; No dysuria, hematuria, or difficulty voiding Musculoskeletal: Negative; no myalgias, joint pain, or weakness Hematologic/Oncology: Negative; no easy bruising, bleeding Endocrine: Negative; no heat/cold intolerance; no diabetes Neuro: Negative; no changes in balance, headaches Skin: Negative; No rashes or skin lesions Psychiatric: Negative; No behavioral problems, depression Sleep: Negative; No snoring, daytime sleepiness, hypersomnolence, bruxism, restless legs, hypnogognic hallucinations, no cataplexy Other comprehensive 14 point system review is negative.   PHYSICAL EXAM:   VS:  BP 110/60    Pulse 99    Ht 6\' 2"  (1.88 m)    Wt 219 lb 6.4 oz (99.5 kg)  SpO2 97%    BMI 28.17 kg/m    Wt Readings from Last 3 Encounters:  02/01/21 219 lb 6.4 oz (99.5 kg)  08/02/15 214 lb 6.4 oz (97.3 kg)    General: Alert, oriented, no distress.  Skin: normal turgor, no rashes, warm and dry HEENT: Normocephalic, atraumatic. Pupils equal round and reactive to light; sclera anicteric; extraocular muscles intact; Fundi ** Nose without nasal septal hypertrophy Mouth/Parynx benign; Mallinpatti scale Neck: No JVD, no carotid bruits; normal carotid upstroke Lungs:  clear to ausculatation and percussion; no wheezing or rales Chest wall: without tenderness to palpitation Heart: PMI not displaced, RRR, s1 s2 normal, 1/6 systolic murmur, no diastolic murmur, no rubs, gallops, thrills, or heaves Abdomen: soft, nontender; no hepatosplenomehaly, BS+; abdominal aorta nontender and not dilated by palpation. Back: no CVA tenderness Pulses 2+ Musculoskeletal: full range of motion, normal strength, no joint deformities Extremities: no clubbing cyanosis or edema, Homan's sign negative  Neurologic: grossly nonfocal; Cranial nerves grossly wnl Psychologic: Normal mood and affect   Studies/Labs Reviewed:   EKG:  EKG is*** ordered today.  The ekg ordered today demonstrates ***  Recent Labs: No flowsheet data found.   No flowsheet data found.  No flowsheet data found. No results found for: MCV No results found for: TSH No results found for: HGBA1C   BNP No results found for: BNP  ProBNP No results found for: PROBNP   Lipid Panel  No results found for: CHOL, TRIG, HDL, CHOLHDL, VLDL, LDLCALC, LDLDIRECT, LABVLDL   RADIOLOGY: US Abdomen Complete  Result Date: 01/30/2021 CLINICAL DATA:  Elevated LFTs.  Edema, possible ascites. EXAM: ABDOMEN ULTRASOUND COMPLETE COMPARISON:  None. FINDINGS: Gallbladder: 2.4 cm shadowing gallstone. Focal wall thickening measuring up to 5 mm, no pericholecystic fluid, sonographic Murphy's sign was not negative. Common bile duct: Diameter: 4 Liver: Hypoechoic area measuring 2.0 x 1.9 cm. Within normal limits in parenchymal echogenicity. Portal vein is patent on color Doppler imaging with normal direction of blood flow towards the liver. IVC: No abnormality visualized. Pancreas: Visualized portion unremarkable. Spleen: Size and appearance within normal limits. Right Kidney: Length: 11.9. Echogenicity within normal limits. No mass or hydronephrosis visualized. Left Kidney: Length: 11.7. Echogenicity within normal limits. Anechoic  avascular cystic structure in the upper pole measuring up to 5 cm. No mass or hydronephrosis visualized. Abdominal aorta: No aneurysm visualized. Other findings: Small amount perihepatic ascites. IMPRESSION: 1. Hypoechoic structure in the left hepatic lobe measuring 2.0 x 1.9 cm, it may be focal fatty sparing or hepatic mass. Follow-up examination with MR would be helpful. 2. Cholelithiasis with gallbladder wall thickening, nonspecific and may represent chronic cholecystitis. 3.  Small ascites. Electronically Signed   By: Larose Hires D.O.   On: 01/30/2021 09:48     Additional studies/ records that were reviewed today include:  ***    ASSESSMENT:    No diagnosis found.   PLAN:  ***   Medication Adjustments/Labs and Tests Ordered: Current medicines are reviewed at length with the patient today.  Concerns regarding medicines are outlined above.  Medication changes, Labs and Tests ordered today are listed in the Patient Instructions below. There are no Patient Instructions on file for this visit.   Signed, Nicki Guadalajara, MD  02/01/2021 1:37 PM    [facility] [address], [place], [place]  [address] Phone: [phone]

## 2021-02-01 NOTE — Progress Notes (Signed)
Cardiology Office Note    Date:  02/04/2021   ID:  Erik Chan, DOB 03-Dec-1955, MRN [id number]  PCP:  Kathyrn Lass, MD  Cardiologist:  Shelva Majestic, MD   Urgent referral by Kathyrn Lass, MD of [facility] for new onset CHF.   History of Present Illness:  Erik Chan is a 65 y.o. male who is followed by Dr. Kathyrn Lass.  Erik Chan has a history of COPD and is recently developed significant swelling over the past 2 to 4 weeks associated with increasing shortness of breath.  Erik Chan had recently had negative testing for COVID RSV and flu.  Erik Chan was recently started on Lasix 40 mg following laboratory which showed a BNP at 2000.  On January 21, 2021 hemoglobin A1c was 6.2 and on December 19 hemoglobin was 14.2, creatinine 1.01, potassium 4.1, TSH 1.17 and ALT was increased to 291.  Erik Chan had undergone an abdominal ultrasound on January 30, 2021 which showed a 2.4 cm shadowing gallstone with focal wall thickening measuring up to 5 mm.  In the liver was a hypoechoic structure in the left hepatic lobe measuring 2.0 x 1.9 cm for which future evaluation was recommended.  There was evidence for small amount of perihepatic ascites.  Our office was contacted and Erik Chan was added onto my schedule to be seen today for initial evaluation.  Patient denies any history of known coronary artery disease chest tightness.  Erik Chan had smoked cigarettes for over 45 years but Erik Chan quit in November 2022.  Erik Chan admits to allergies to shellfish.  Erik Chan denied any other prior medical history with the exception of "some COPD before I quit smoking."   Current Medications: Outpatient Medications Prior to Visit  Medication Sig Dispense Refill   furosemide (LASIX) 40 MG tablet Take 40 mg by mouth daily.     albuterol (VENTOLIN HFA) 108 (90 Base) MCG/ACT inhaler Inhale into the lungs.     doxycycline (VIBRA-TABS) 100 MG tablet Take 100 mg by mouth 2 (two) times daily. (Patient not taking: Reported on 02/01/2021)     guaiFENesin (MUCINEX) 600 MG 12 hr  tablet Take 600 mg by mouth 2 (two) times daily as needed. (Patient not taking: Reported on 02/01/2021)     Lactobacillus (LACTINEX PO) Take 1 tablet by mouth daily. (Patient not taking: Reported on 02/01/2021)     mometasone-formoterol (DULERA) 100-5 MCG/ACT AERO Take 2 puffs first thing in am and then another 2 puffs about 12 hours later. (Patient not taking: Reported on 02/01/2021)     No facility-administered medications prior to visit.     Allergies:   Shellfish-derived products   Social History   Socioeconomic History   Marital status: Married    Spouse name: Not on file   Number of children: Not on file   Years of education: Not on file   Highest education level: Not on file  Occupational History   Occupation: Banker   Tobacco Use   Smoking status: Every Day    Packs/day: 1.00    Years: 30.00    Pack years: 30.00    Types: Pipe, Cigarettes   Smokeless tobacco: Never  Substance and Sexual Activity   Alcohol use: No    Alcohol/week: 0.0 standard drinks   Drug use: No   Sexual activity: Not on file  Other Topics Concern   Not on file  Social History Narrative   Not on file   Social Determinants of Health   Financial Resource Strain: Not on file  Food Insecurity:  Not on file  Transportation Needs: Not on file  Physical Activity: Not on file  Stress: Not on file  Social Connections: Not on file    Erik Chan was born in [place].  Additional social history is notable that Erik Chan is married for 20 years.  Erik Chan has 1 child and 2 grandchildren.  Erik Chan completed 2 years of college and is retired from [name].  Erik Chan had been walking up to about a month ago of 2-3 blocks but over the past month has not been walking.  Erik Chan had smoked for 45 years, starting at age 12 and quit in November 2022  Family History:  The patient's family history includes Allergies in his mother; Asthma in his mother.  His mother is living at age 15.  Father died at age 68 and had intestinal cancer  and COVID complications.  Erik Chan has no siblings.  ROS General: Negative; No fevers, chills, or night sweats;  HEENT: Negative; No changes in vision or hearing, sinus congestion, difficulty swallowing Pulmonary: COPD Cardiovascular: See HPI GI: Negative; No nausea, vomiting, diarrhea, or abdominal pain GU: Negative; No dysuria, hematuria, or difficulty voiding Musculoskeletal: Negative; no myalgias, joint pain, or weakness Hematologic/Oncology: Negative; no easy bruising, bleeding Endocrine: Negative; no heat/cold intolerance; no diabetes Neuro: Negative; no changes in balance, headaches Skin: Negative; No rashes or skin lesions Psychiatric: Negative; No behavioral problems, depression Sleep: Negative; No snoring, daytime sleepiness, hypersomnolence, bruxism, restless legs, hypnogognic hallucinations, no cataplexy Other comprehensive 14 point system review is negative.   PHYSICAL EXAM:   VS:  BP 110/60    Pulse 99    Ht _0  (1.88 m)    Wt 219 lb 6.4 oz (99.5 kg)    SpO2 97%    BMI 28.17 kg/m     Repeat blood pressure by me was 108/60 supine, and 102/60 sitting.  Heart rate approximately 100  Wt Readings from Last 3 Encounters:  02/01/21 219 lb 6.4 oz (99.5 kg)  08/02/15 214 lb 6.4 oz (97.3 kg)    General: Alert, oriented, no distress.  Skin: normal turgor, no rashes, warm and dry HEENT: Normocephalic, atraumatic. Pupils equal round and reactive to light; sclera anicteric; extraocular muscles intact;  Nose without nasal septal hypertrophy Mouth/Parynx benign; Mallinpatti scale 3 Neck: No JVD, no carotid bruits; normal carotid upstroke Lungs: clear to ausculatation and percussion; no wheezing or rales Chest wall: without tenderness to palpitation Heart: PMI not displaced, RRR, s1 s2 normal, 1/6 systolic murmur, no diastolic murmur, no rubs, gallops, thrills, or heaves Abdomen: Mild abdominal swelling.  Nontender right upper quadrant.  Soft, nontender; no hepatosplenomehaly, BS+;  abdominal aorta nontender and not dilated by palpation. Back: no CVA tenderness Pulses 2+ Musculoskeletal: full range of motion, normal strength, no joint deformities Extremities: 2+ tense bilateral lower extremity edema to his knees ; no clubbing cyanosis, Homan's sign negative  Neurologic: grossly nonfocal; Cranial nerves grossly wnl Psychologic: Normal mood and affect   Studies/Labs Reviewed:   ECG (independently read by me): Sinus rhythm at 99 bpm with occasional PACs and isolated PVC.  Nonspecific T wave abnormality.  Recent Labs: BMP Latest Ref Rng & Units 02/01/2021  Glucose 70 - 99 mg/dL 104(H)  BUN 8 - 27 mg/dL 13  Creatinine 0.76 - 1.27 mg/dL 1.09  BUN/Creat Ratio 10 - 24 12  Sodium 134 - 144 mmol/L 135  Potassium 3.5 - 5.2 mmol/L 3.8  Chloride 96 - 106 mmol/L 91(L)  CO2 20 - 29 mmol/L 26  Calcium 8.6 - 10.2 mg/dL 8.9     Hepatic Function Latest Ref Rng & Units 02/01/2021  Total Protein 6.0 - 8.5 g/dL 6.6  Albumin 3.8 - 4.8 g/dL 4.0  AST 0 - 40 IU/L 55(H)  ALT 0 - 44 IU/L 145(H)  Alk Phosphatase 44 - 121 IU/L 141(H)  Total Bilirubin 0.0 - 1.2 mg/dL 2.0(H)    No flowsheet data found. No results found for: MCV No results found for: TSH No results found for: HGBA1C   BNP    Component Value Date/Time   BNP 2,411.9 (H) 02/01/2021 1522    ProBNP No results found for: PROBNP   Lipid Panel     Component Value Date/Time   CHOL 133 02/01/2021 1655   TRIG 115 02/01/2021 1655   HDL 35 (L) 02/01/2021 1655   CHOLHDL 3.8 02/01/2021 1655   LDLCALC 77 02/01/2021 1655   LABVLDL 21 02/01/2021 1655     RADIOLOGY: US Abdomen Complete  Result Date: 01/30/2021 CLINICAL DATA:  Elevated LFTs.  Edema, possible ascites. EXAM: ABDOMEN ULTRASOUND COMPLETE COMPARISON:  None. FINDINGS: Gallbladder: 2.4 cm shadowing gallstone. Focal wall thickening measuring up to 5 mm, no pericholecystic fluid, sonographic Murphy's sign was not negative. Common bile duct: Diameter: 4  Liver: Hypoechoic area measuring 2.0 x 1.9 cm. Within normal limits in parenchymal echogenicity. Portal vein is patent on color Doppler imaging with normal direction of blood flow towards the liver. IVC: No abnormality visualized. Pancreas: Visualized portion unremarkable. Spleen: Size and appearance within normal limits. Right Kidney: Length: 11.9. Echogenicity within normal limits. No mass or hydronephrosis visualized. Left Kidney: Length: 11.7. Echogenicity within normal limits. Anechoic avascular cystic structure in the upper pole measuring up to 5 cm. No mass or hydronephrosis visualized. Abdominal aorta: No aneurysm visualized. Other findings: Small amount perihepatic ascites. IMPRESSION: 1. Hypoechoic structure in the left hepatic lobe measuring 2.0 x 1.9 cm, it may be focal fatty sparing or hepatic mass. Follow-up examination with MR would be helpful. 2. Cholelithiasis with gallbladder wall thickening, nonspecific and may represent chronic cholecystitis. 3.  Small ascites. Electronically Signed   By: Keane Police D.O.   On: 01/30/2021 09:48     Additional studies/ records that were reviewed today include:  Abdominal ultrasound was reviewed.    ASSESSMENT:    1. Shortness of breath   2. Bilateral lower extremity edema   3. Acute on chronic combined systolic and diastolic CHF (congestive heart failure) ([place])   4. LFT elevation   5. Gallstones   6. Other ascites   7. Tobacco abuse: 45 years, quit November 2022     PLAN:  Erik Chan is a 65 year old gentleman who started smoking at age 61 and recently quit smoking last month.  Erik Chan denies any history of chest pain or palpitations.  Over the past 2 to 4 weeks Erik Chan has noticed lower extremity swelling and increasing shortness of breath.  Erik Chan apparently had negative test for COVID, RSV and flu.  His swelling has persisted.  Erik Chan had an abdominal ultrasound which showed cholelithiasis with gallbladder wall thickening which was nonspecific and may  represent chronic cholecystitis.  Erik Chan also was found to have an hypoechoic structure in the left hepatic lobe which may be focal fatty sparing or hepatic mass and further evaluation was recommended.  There was mild perihepatic ascites.  Presently, his blood pressure is stable but low without significant orthostatic change.  His resting pulse is around 100 bpm.  Erik Chan was recently found to have  ALT elevation at 291.  Presently I am recommending follow-up laboratory with a comprehensive metabolic panel, BN P, amylase, fasting lipid studies and I am scheduling him to undergo a PA and lateral chest x-ray as well as 2D echo Doppler evaluation.  I have recommended for now Erik Chan increase Lasix and take 60 mg in the morning and 40 mg in the evening tomorrow and then 40 mg twice a day.  I am initiating metoprolol succinate at 12.5 mg for 4 days and we will then increase this to 25 mg daily.  I have recommended support stockings to assist with his lower extremity edema.  I have recommended that Erik Chan be reevaluated in 3 weeks by one of our APP's or pharmacists and then I see him in 6 weeks for reevaluation.   Medication Adjustments/Labs and Tests Ordered: Current medicines are reviewed at length with the patient today.  Concerns regarding medicines are outlined above.  Medication changes, Labs and Tests ordered today are listed in the Patient Instructions below. Patient Instructions  Medication Instructions:  Tomorrow- take 1.5 tablet (60 mg) in the AM, and 1 tablet (40 mg) in the PM. Then after that take 1 tablet (40 mg) in the AM, and 1 tablet (40 mg) in the PM.   Start Metoprolol Succinate 25 mg (take 0.5 tablet- 12.5 mg for the first 4 days, then increase to 1 tablet- 25 mg daily)   *If you need a refill on your cardiac medications before your next appointment, please call your pharmacy*   Lab Work: BNP, CMET, AMALAYSE, LIPID today  If you have labs (blood work) drawn today and your tests are completely normal,  you will receive your results only by: [place] (if you have MyChart) OR A paper copy in the mail If you have any lab test that is abnormal or we need to change your treatment, we will call you to review the results.   Testing/Procedures: Echocardiogram (STAT) - Your physician has requested that you have an echocardiogram. Echocardiography is a painless test that uses sound waves to create images of your heart. It provides your doctor with information about the size and shape of your heart and how well your hearts chambers and valves are working. This procedure takes approximately one hour. There are no restrictions for this procedure. This will be performed at either our [facility] location - [address] [place] location [facility] 2nd floor.   Chest xray - Your physician has requested that you have a chest xray, is a fast and painless imaging test that uses certain electromagnetic waves to create pictures of the structures in and around your chest. This test can help diagnose and monitor conditions such as pneumonia and other lung issues his will be done at [facility], [place]. If you should need to call them their phone number is [phone].    Follow-Up: At [facility], you and your health needs are our priority.  As part of our continuing mission to provide you with exceptional heart care, we have created designated Provider Care Teams.  These Care Teams include your primary Cardiologist (physician) and Advanced Practice Providers (APPs -  Physician Assistants and Nurse Practitioners) who all work together to provide you with the care you need, when you need it.  We recommend signing up for the patient portal called "MyChart".  Sign up information is provided on this After Visit Summary.  MyChart is used to connect  with patients for Virtual Visits (Telemedicine).  Patients are able to view lab/test results, encounter  notes, upcoming appointments, etc.  Non-urgent messages can be sent to your provider as well.   To learn more about what you can do with MyChart, go to [email or web address].    Your next appointment:   3 week(s)  The format for your next appointment:   In Person  Provider:   Sande Rives, PA-C or Almyra Deforest, PA-C    Then, None will plan to see you again in 6 week(s).    Other Instructions How to Use Compression Stockings Compression stockings are elastic socks that help increase blood flow (circulation) to the legs, decrease swelling in the legs, and reduce the chance of developing blood clots in the lower legs. Compression stockings squeeze or apply pressure to the legs. The stockings are graduated, meaning the highest amount of pressure occurs at the toes and it decreases going toward the upper part of the leg. This helps ensure proper circulation through the veins. Compression stockings are often used by people who: Are recovering from surgery. The stockings help prevent blood clots after surgery. Have poor circulation or swelling in their legs because of a medical condition, such as chronic venous insufficiency, venous stasis, or lymphedema. Have a history of getting blood clots in their legs. Have bulging (varicose) veins. Sit or stay in bed for long periods of time (immobilization). Stand for long periods of time and experience leg pain or fatigue. Follow instructions from your health care provider about how and when to wear your compression stockings. What are the risks? Generally, compression stockings are safe to wear. However, problems may occur for some people, such as: The stockings being ineffective at increasing the circulation to the legs, decreasing swelling in the legs, or reducing the chance of developing blood clots in the lower legs. Skin complications, including breaks in the skin, open wounds, blisters, or dermatitis. How to wear compression stockings Before you  put on your compression stockings: Make sure that they are the correct size and degree of compression. If you do not know your size or required grade of compression, ask your health care provider and follow the manufacturer's instructions that come with the stockings. Be sure they are the appropriate length for your medical needs. Compression stockings come in different lengths, including knee high, thigh high, and even up to the waist. Make sure that the stockings are clean, dry, and in good condition. Check the stockings for rips and tears. Do not put them on if they are ripped or torn. Put your stockings on first thing in the morning, before you get out of bed. Keep them on for as long as your health care provider advises. Most people are told to remove their compression stockings at the end of the day before bed. When you are wearing your stockings: Keep them as smooth as possible. Do not allow them to bunch up. It is especially important to prevent the stockings from bunching up around your toes or behind your knees. Make sure that the toe holes are underneath the toes and the heel patches are positioned at the heels. Do not roll the stockings downward and leave them rolled down. This can decrease blood flow to your legs. Change the stockings right away if they become wet or dirty or if they have a bad smell. If you have chronic leg wounds, make sure the wounds are properly covered or dressed before putting on your compression stockings. When you  take off your stockings, check your legs and feet for: Open sores. Red spots or other areas of discoloration. Swelling. General tips Do not stop wearing compression stockings. Talk to your health care provider if your stockings feel too tight. Wash your stockings often with mild detergent in cold or warm water. Also wash them whenever they get dirty or have a bad smell. Do not use bleach. Air-dry your stockings or dry them in a clothes dryer on low  heat. It may be helpful to have two pairs so that you have a pair to wear while the other is being washed. Replace your stockings every 3-6 months. If skin moisturizing is part of your treatment plan, apply lotion or cream at night so that your skin will be dry when you put on the stockings in the morning. It is harder to put the stockings on when you have lotion on your legs or feet. Wear nonskid shoes or slip-resistant socks when walking while wearing compression stockings. If you have difficulty putting on or taking off the compression stockings, ask your health care provider about devices that may help make this easier. Contact a health care provider and remove your stockings if: You have a prickling or tingling feeling in your feet or legs. You have new open sores, red spots, or other skin changes on your feet or legs. You have swelling or pain that gets worse. Get help right away if: You have shortness of breath or chest pain. Your heartbeat is fast or irregular. You have new swelling, pain, or warmth in your leg. You have numbness or tingling in your lower legs that does not get better after you take the stockings off. Your toes or feet are unusually cold or turn a bluish color. You feel light-headed or dizzy. These symptoms may represent a serious problem that is an emergency. Do not wait to see if the symptoms will go away. Get medical help right away. Call your local emergency services (911 in the [place] Do not drive yourself to the hospital. Summary Compression stockings are elastic socks that are worn to treat a variety of symptoms and medical conditions such as venous insufficiency, venous stasis, or lymphedema. Compression stockings help increase blood flow (circulation) to the legs, decrease swelling in the legs, and reduce the chance of developing blood clots in the lower legs. Follow instructions from your health care provider about how and when to wear your compression  stockings. Do not stop wearing your compression stockings without talking to your health care provider first. This information is not intended to replace advice given to you by your health care provider. Make sure you discuss any questions you have with your health care provider. Document Revised: 07/19/2020 Document Reviewed: 07/19/2020 Elsevier Patient Education  2022 [place], Shelva Majestic, MD  02/04/2021 4:13 PM    [facility] [address], [place], [place], [place]  [address] Phone: [phone]

## 2021-02-02 LAB — COMPREHENSIVE METABOLIC PANEL
ALT: 145 IU/L — ABNORMAL HIGH (ref 0–44)
AST: 55 IU/L — ABNORMAL HIGH (ref 0–40)
Albumin/Globulin Ratio: 1.5 (ref 1.2–2.2)
Albumin: 4 g/dL (ref 3.8–4.8)
Alkaline Phosphatase: 141 IU/L — ABNORMAL HIGH (ref 44–121)
BUN/Creatinine Ratio: 12 (ref 10–24)
BUN: 13 mg/dL (ref 8–27)
Bilirubin Total: 2 mg/dL — ABNORMAL HIGH (ref 0.0–1.2)
CO2: 26 mmol/L (ref 20–29)
Calcium: 8.9 mg/dL (ref 8.6–10.2)
Chloride: 91 mmol/L — ABNORMAL LOW (ref 96–106)
Creatinine, Ser: 1.09 mg/dL (ref 0.76–1.27)
Globulin, Total: 2.6 g/dL (ref 1.5–4.5)
Glucose: 104 mg/dL — ABNORMAL HIGH (ref 70–99)
Potassium: 3.8 mmol/L (ref 3.5–5.2)
Sodium: 135 mmol/L (ref 134–144)
Total Protein: 6.6 g/dL (ref 6.0–8.5)
eGFR: 75 mL/min/{1.73_m2} (ref >59)

## 2021-02-02 LAB — LIPID PANEL
Chol/HDL Ratio: 3.8 ratio (ref 0.0–5.0)
Cholesterol, Total: 133 mg/dL (ref 100–199)
HDL: 35 mg/dL — ABNORMAL LOW (ref >39)
LDL Chol Calc (NIH): 77 mg/dL (ref 0–99)
Triglycerides: 115 mg/dL (ref 0–149)
VLDL Cholesterol Cal: 21 mg/dL (ref 5–40)

## 2021-02-02 LAB — AMYLASE: Amylase: 45 U/L (ref 31–110)

## 2021-02-02 LAB — BRAIN NATRIURETIC PEPTIDE: BNP: 2411.9 pg/mL — ABNORMAL HIGH (ref 0.0–100.0)

## 2021-02-04 ENCOUNTER — Encounter: Payer: Self-pay | Admitting: Cardiovascular Disease

## 2021-02-05 ENCOUNTER — Other Ambulatory Visit: Payer: Self-pay

## 2021-02-05 ENCOUNTER — Other Ambulatory Visit: Payer: Self-pay | Admitting: Family Medicine

## 2021-02-05 ENCOUNTER — Telehealth: Payer: Self-pay | Admitting: Cardiovascular Disease

## 2021-02-05 DIAGNOSIS — R9389 Abnormal findings on diagnostic imaging of other specified body structures: Secondary | ICD-10-CM

## 2021-02-05 MED ORDER — LOSARTAN POTASSIUM 25 MG PO TABS: 25.0000 mg | ORAL_TABLET | Freq: Every day | ORAL | 3 refills | Status: DC

## 2021-02-05 NOTE — Telephone Encounter (Signed)
Returned call to pt he states that he had abdominal US with Dr Hyacinth Meeker and would like to have Dr Tresa Endo "take a look at it and let him know what he thinks" informed pt that Dr Tresa Endo is out of town and it would probably not be this week. Verbalized understanding.

## 2021-02-05 NOTE — Telephone Encounter (Signed)
Patient states he was supposed to have a test on his liver, but has not heard anything. He says he thinks it may be an MRI. He also says if the office needs to contact his PCP Erik Chan to get this done that is fine. Phone: [phone] if he does not answer try: [phone]

## 2021-02-06 ENCOUNTER — Telehealth: Payer: Self-pay | Admitting: Cardiovascular Disease

## 2021-02-06 ENCOUNTER — Ambulatory Visit
Admission: RE | Admit: 2021-02-06 | Discharge: 2021-02-06 | Disposition: A | Discharge status: Home / Self Care | Payer: Medicare Other | Source: Ambulatory Visit | Attending: Cardiovascular Disease | Admitting: Cardiovascular Disease

## 2021-02-06 ENCOUNTER — Other Ambulatory Visit (HOSPITAL_COMMUNITY): Payer: Medicare Other

## 2021-02-06 DIAGNOSIS — R0602 Shortness of breath: Secondary | ICD-10-CM | POA: Diagnosis not present

## 2021-02-06 IMAGING — CR DG CHEST 2V
2 series · 2 of 2 positions shown · non-contrast
Comparison: [DATE]

CLINICAL DATA: Shortness of breath congestion

EXAM:
CHEST - 2 VIEW

[w chest pa]
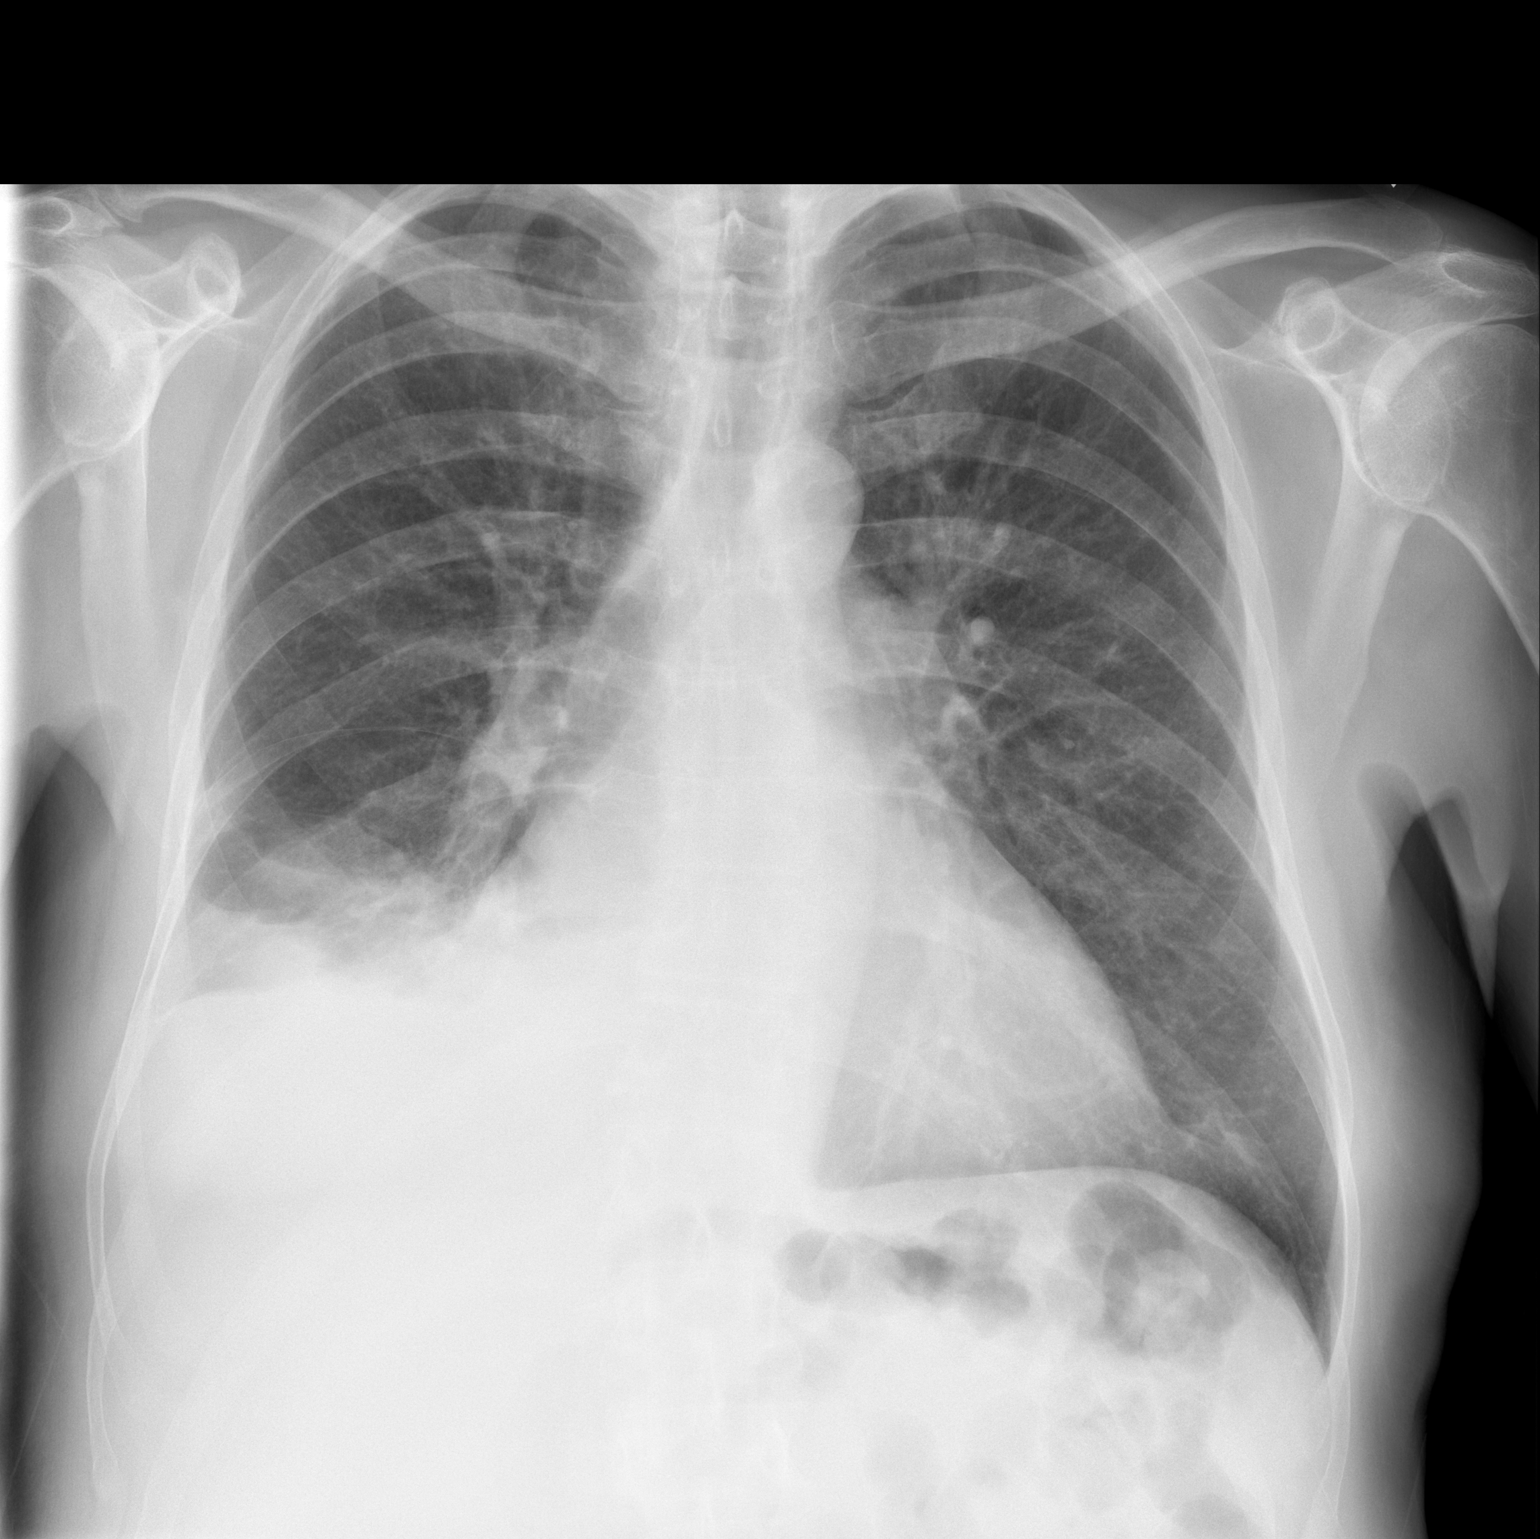

[w chest lat]
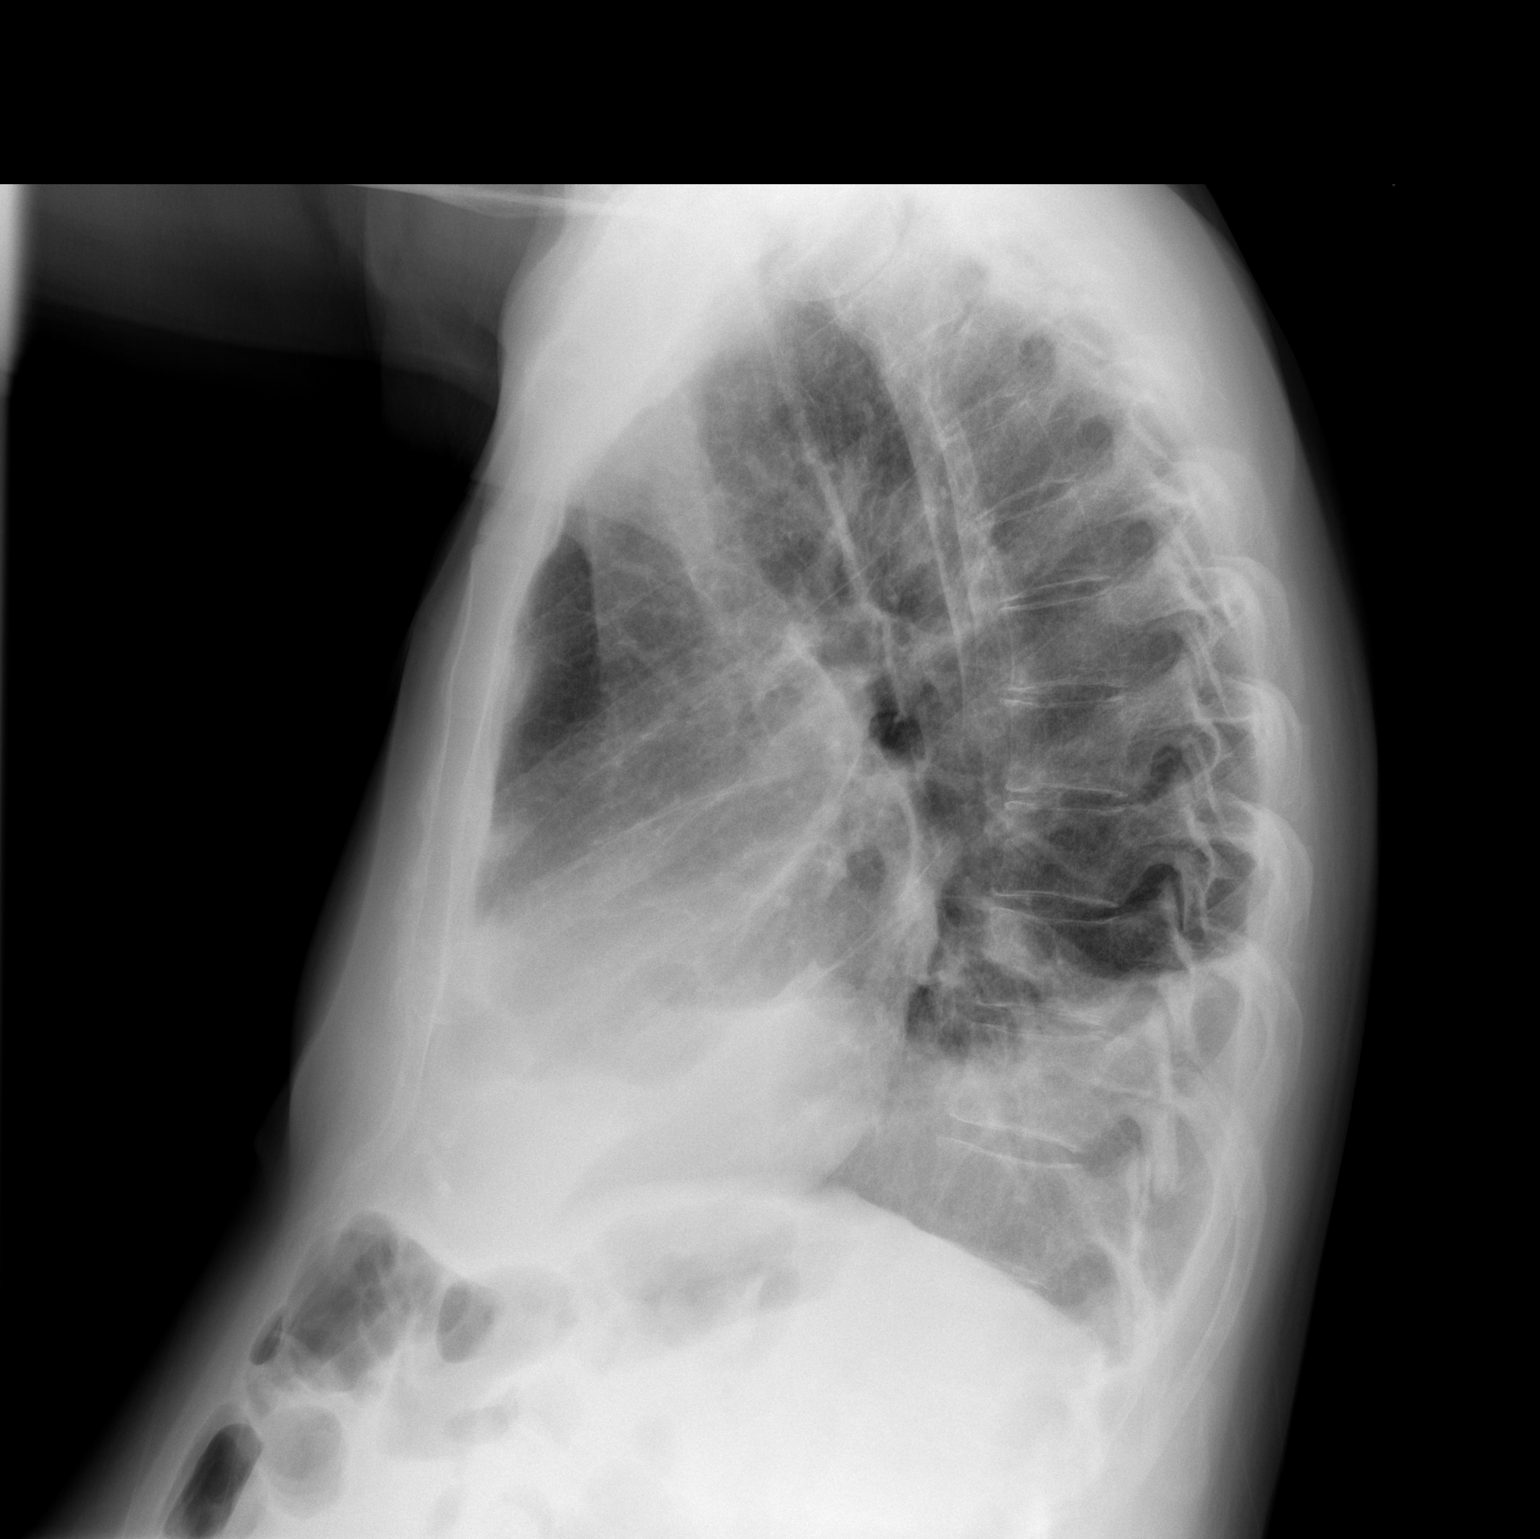

[2 of 2 positions shown; findings below may reference images not displayed]

FINDINGS: The left lung is clear. There is a moderate right-sided pleural
effusion. Airspace disease at the right middle lobe and right base.
Normal cardiac size. No pneumothorax.
IMPRESSION: Moderate right pleural effusion with atelectasis or pneumonia at the
right middle lobe and right base. Radiographic follow-up to
resolution is recommended.

## 2021-02-06 NOTE — Telephone Encounter (Signed)
Spoke with [name] she states that she has a critical to report for pt. Discussed with Dr Rennis Golden. He states that he does not think that pt has pneumonia he thinks that pt has pleural effusion. Make sure pt does not have fever and inform pt that if SOB increases he should go to ER may need thoracentesis to draw out the fluid there. Returned call to pt he will go to the ER  if SOB increases or fever. Forwarded to Pcp as requested by pt.

## 2021-02-06 NOTE — Telephone Encounter (Signed)
[name] from [facility] calling with a call report.

## 2021-02-13 ENCOUNTER — Ambulatory Visit (INDEPENDENT_AMBULATORY_CARE_PROVIDER_SITE_OTHER): Payer: Medicare Other

## 2021-02-13 ENCOUNTER — Emergency Department (HOSPITAL_BASED_OUTPATIENT_CLINIC_OR_DEPARTMENT_OTHER): Payer: Medicare Other | Admitting: Radiology

## 2021-02-13 ENCOUNTER — Inpatient Hospital Stay (HOSPITAL_BASED_OUTPATIENT_CLINIC_OR_DEPARTMENT_OTHER)
Admission: EM | Admit: 2021-02-13 | Discharge: 2021-02-26 | DRG: 287 | Disposition: A | Discharge status: Home Health | Payer: Medicare Other | Source: Ambulatory Visit | Attending: Internal Medicine | Admitting: Internal Medicine

## 2021-02-13 ENCOUNTER — Ambulatory Visit (INDEPENDENT_AMBULATORY_CARE_PROVIDER_SITE_OTHER): Payer: Medicare Other | Admitting: Internal Medicine

## 2021-02-13 ENCOUNTER — Encounter (HOSPITAL_BASED_OUTPATIENT_CLINIC_OR_DEPARTMENT_OTHER): Payer: Self-pay

## 2021-02-13 ENCOUNTER — Other Ambulatory Visit: Payer: Self-pay

## 2021-02-13 ENCOUNTER — Encounter (HOSPITAL_BASED_OUTPATIENT_CLINIC_OR_DEPARTMENT_OTHER): Payer: Self-pay | Admitting: Internal Medicine

## 2021-02-13 VITALS — BP 130/72 | HR 86 | Ht 74.0 in | Wt 219.0 lb

## 2021-02-13 DIAGNOSIS — R600 Localized edema: Secondary | ICD-10-CM | POA: Diagnosis not present

## 2021-02-13 DIAGNOSIS — I428 Other cardiomyopathies: Secondary | ICD-10-CM | POA: Diagnosis not present

## 2021-02-13 DIAGNOSIS — R0602 Shortness of breath: Secondary | ICD-10-CM | POA: Diagnosis not present

## 2021-02-13 DIAGNOSIS — J449 Chronic obstructive pulmonary disease, unspecified: Secondary | ICD-10-CM

## 2021-02-13 DIAGNOSIS — I5022 Chronic systolic (congestive) heart failure: Secondary | ICD-10-CM

## 2021-02-13 DIAGNOSIS — E876 Hypokalemia: Secondary | ICD-10-CM | POA: Diagnosis present

## 2021-02-13 DIAGNOSIS — I472 Ventricular tachycardia, unspecified: Secondary | ICD-10-CM | POA: Diagnosis not present

## 2021-02-13 DIAGNOSIS — Z825 Family history of asthma and other chronic lower respiratory diseases: Secondary | ICD-10-CM | POA: Diagnosis not present

## 2021-02-13 DIAGNOSIS — R531 Weakness: Secondary | ICD-10-CM | POA: Diagnosis not present

## 2021-02-13 DIAGNOSIS — I110 Hypertensive heart disease with heart failure: Secondary | ICD-10-CM | POA: Diagnosis not present

## 2021-02-13 DIAGNOSIS — Z20822 Contact with and (suspected) exposure to covid-19: Secondary | ICD-10-CM | POA: Diagnosis not present

## 2021-02-13 DIAGNOSIS — Z87891 Personal history of nicotine dependence: Secondary | ICD-10-CM

## 2021-02-13 DIAGNOSIS — I5043 Acute on chronic combined systolic (congestive) and diastolic (congestive) heart failure: Secondary | ICD-10-CM | POA: Diagnosis not present

## 2021-02-13 DIAGNOSIS — I5023 Acute on chronic systolic (congestive) heart failure: Principal | ICD-10-CM | POA: Diagnosis present

## 2021-02-13 DIAGNOSIS — I509 Heart failure, unspecified: Secondary | ICD-10-CM | POA: Diagnosis not present

## 2021-02-13 DIAGNOSIS — Z7984 Long term (current) use of oral hypoglycemic drugs: Secondary | ICD-10-CM

## 2021-02-13 DIAGNOSIS — Z9889 Other specified postprocedural states: Secondary | ICD-10-CM

## 2021-02-13 DIAGNOSIS — Z91013 Allergy to seafood: Secondary | ICD-10-CM

## 2021-02-13 DIAGNOSIS — M797 Fibromyalgia: Secondary | ICD-10-CM | POA: Diagnosis not present

## 2021-02-13 DIAGNOSIS — J90 Pleural effusion, not elsewhere classified: Secondary | ICD-10-CM

## 2021-02-13 DIAGNOSIS — R188 Other ascites: Secondary | ICD-10-CM

## 2021-02-13 DIAGNOSIS — I5030 Unspecified diastolic (congestive) heart failure: Secondary | ICD-10-CM

## 2021-02-13 DIAGNOSIS — I5021 Acute systolic (congestive) heart failure: Secondary | ICD-10-CM

## 2021-02-13 DIAGNOSIS — I2510 Atherosclerotic heart disease of native coronary artery without angina pectoris: Secondary | ICD-10-CM | POA: Diagnosis present

## 2021-02-13 DIAGNOSIS — J439 Emphysema, unspecified: Secondary | ICD-10-CM | POA: Diagnosis present

## 2021-02-13 DIAGNOSIS — R911 Solitary pulmonary nodule: Secondary | ICD-10-CM | POA: Diagnosis not present

## 2021-02-13 DIAGNOSIS — R091 Pleurisy: Secondary | ICD-10-CM | POA: Diagnosis not present

## 2021-02-13 DIAGNOSIS — J918 Pleural effusion in other conditions classified elsewhere: Secondary | ICD-10-CM | POA: Diagnosis not present

## 2021-02-13 DIAGNOSIS — Z79899 Other long term (current) drug therapy: Secondary | ICD-10-CM

## 2021-02-13 DIAGNOSIS — Z95828 Presence of other vascular implants and grafts: Secondary | ICD-10-CM

## 2021-02-13 DIAGNOSIS — E119 Type 2 diabetes mellitus without complications: Secondary | ICD-10-CM | POA: Diagnosis present

## 2021-02-13 DIAGNOSIS — I5082 Biventricular heart failure: Secondary | ICD-10-CM | POA: Diagnosis not present

## 2021-02-13 DIAGNOSIS — I3139 Other pericardial effusion (noninflammatory): Secondary | ICD-10-CM | POA: Diagnosis not present

## 2021-02-13 DIAGNOSIS — I493 Ventricular premature depolarization: Secondary | ICD-10-CM | POA: Diagnosis present

## 2021-02-13 DIAGNOSIS — I517 Cardiomegaly: Secondary | ICD-10-CM | POA: Diagnosis not present

## 2021-02-13 DIAGNOSIS — J9811 Atelectasis: Secondary | ICD-10-CM | POA: Diagnosis not present

## 2021-02-13 DIAGNOSIS — E871 Hypo-osmolality and hyponatremia: Secondary | ICD-10-CM | POA: Diagnosis present

## 2021-02-13 DIAGNOSIS — R918 Other nonspecific abnormal finding of lung field: Secondary | ICD-10-CM | POA: Diagnosis not present

## 2021-02-13 HISTORY — DX: Heart failure, unspecified: I50.9

## 2021-02-13 LAB — CBC
HCT: 40.4 % (ref 39.0–52.0)
Hemoglobin: 13.1 g/dL (ref 13.0–17.0)
MCH: 29 pg (ref 26.0–34.0)
MCHC: 32.4 g/dL (ref 30.0–36.0)
MCV: 89.4 fL (ref 80.0–100.0)
Platelets: 133 10*3/uL — ABNORMAL LOW (ref 150–400)
RBC: 4.52 MIL/uL (ref 4.22–5.81)
RDW: 16 % — ABNORMAL HIGH (ref 11.5–15.5)
WBC: 7.9 10*3/uL (ref 4.0–10.5)
nRBC: 0 % (ref 0.0–0.2)

## 2021-02-13 LAB — ECHOCARDIOGRAM COMPLETE
AR max vel: 1.56 cm2
AV Area VTI: 1.71 cm2
AV Area mean vel: 1.43 cm2
AV Mean grad: 2 mmHg
AV Peak grad: 4.2 mmHg
AV Vena cont: 0.29 cm
Ao pk vel: 1.03 m/s
Area-P 1/2: 3.39 cm2
MV M vel: 3.72 m/s
MV Peak grad: 55.4 mmHg
P 1/2 time: 447 msec
Radius: 1.1 cm
S' Lateral: 6.12 cm

## 2021-02-13 LAB — RESP PANEL BY RT-PCR (FLU A&B, COVID) ARPGX2
Influenza A by PCR: NEGATIVE
Influenza B by PCR: NEGATIVE
SARS Coronavirus 2 by RT PCR: NEGATIVE

## 2021-02-13 LAB — BASIC METABOLIC PANEL
Anion gap: 10 (ref 5–15)
BUN: 18 mg/dL (ref 8–23)
CO2: 30 mmol/L (ref 22–32)
Calcium: 9 mg/dL (ref 8.9–10.3)
Chloride: 94 mmol/L — ABNORMAL LOW (ref 98–111)
Creatinine, Ser: 0.93 mg/dL (ref 0.61–1.24)
GFR, Estimated: >60 mL/min (ref >60)
Glucose, Bld: 104 mg/dL — ABNORMAL HIGH (ref 70–99)
Potassium: 3.5 mmol/L (ref 3.5–5.1)
Sodium: 134 mmol/L — ABNORMAL LOW (ref 135–145)

## 2021-02-13 LAB — BRAIN NATRIURETIC PEPTIDE: B Natriuretic Peptide: 1831.1 pg/mL — ABNORMAL HIGH (ref 0.0–100.0)

## 2021-02-13 IMAGING — DX DG CHEST 2V
2 series · 2 of 2 positions shown · non-contrast
Comparison: [DATE].

CLINICAL DATA: CHF

EXAM:
CHEST - 2 VIEW

[chest pa]
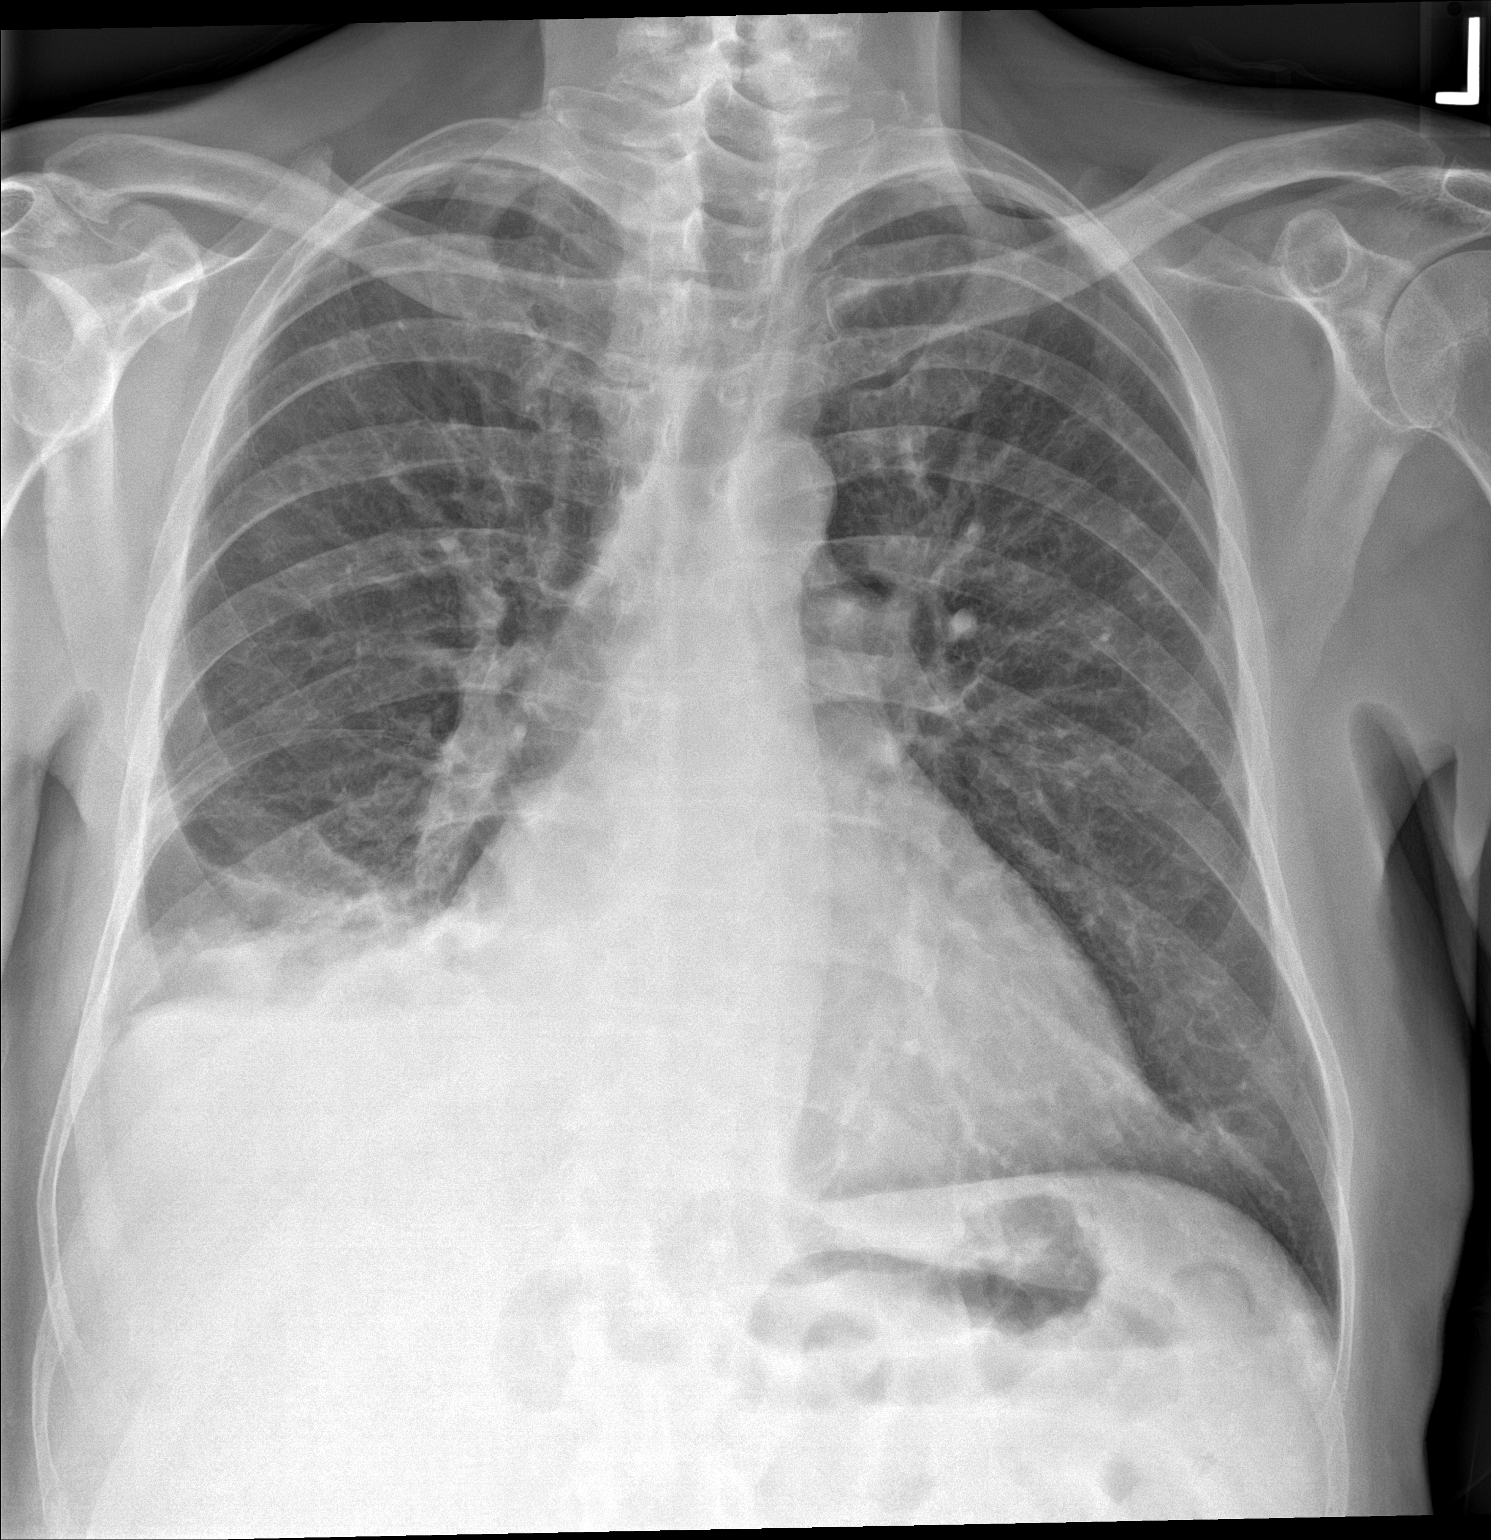

[chest lat]
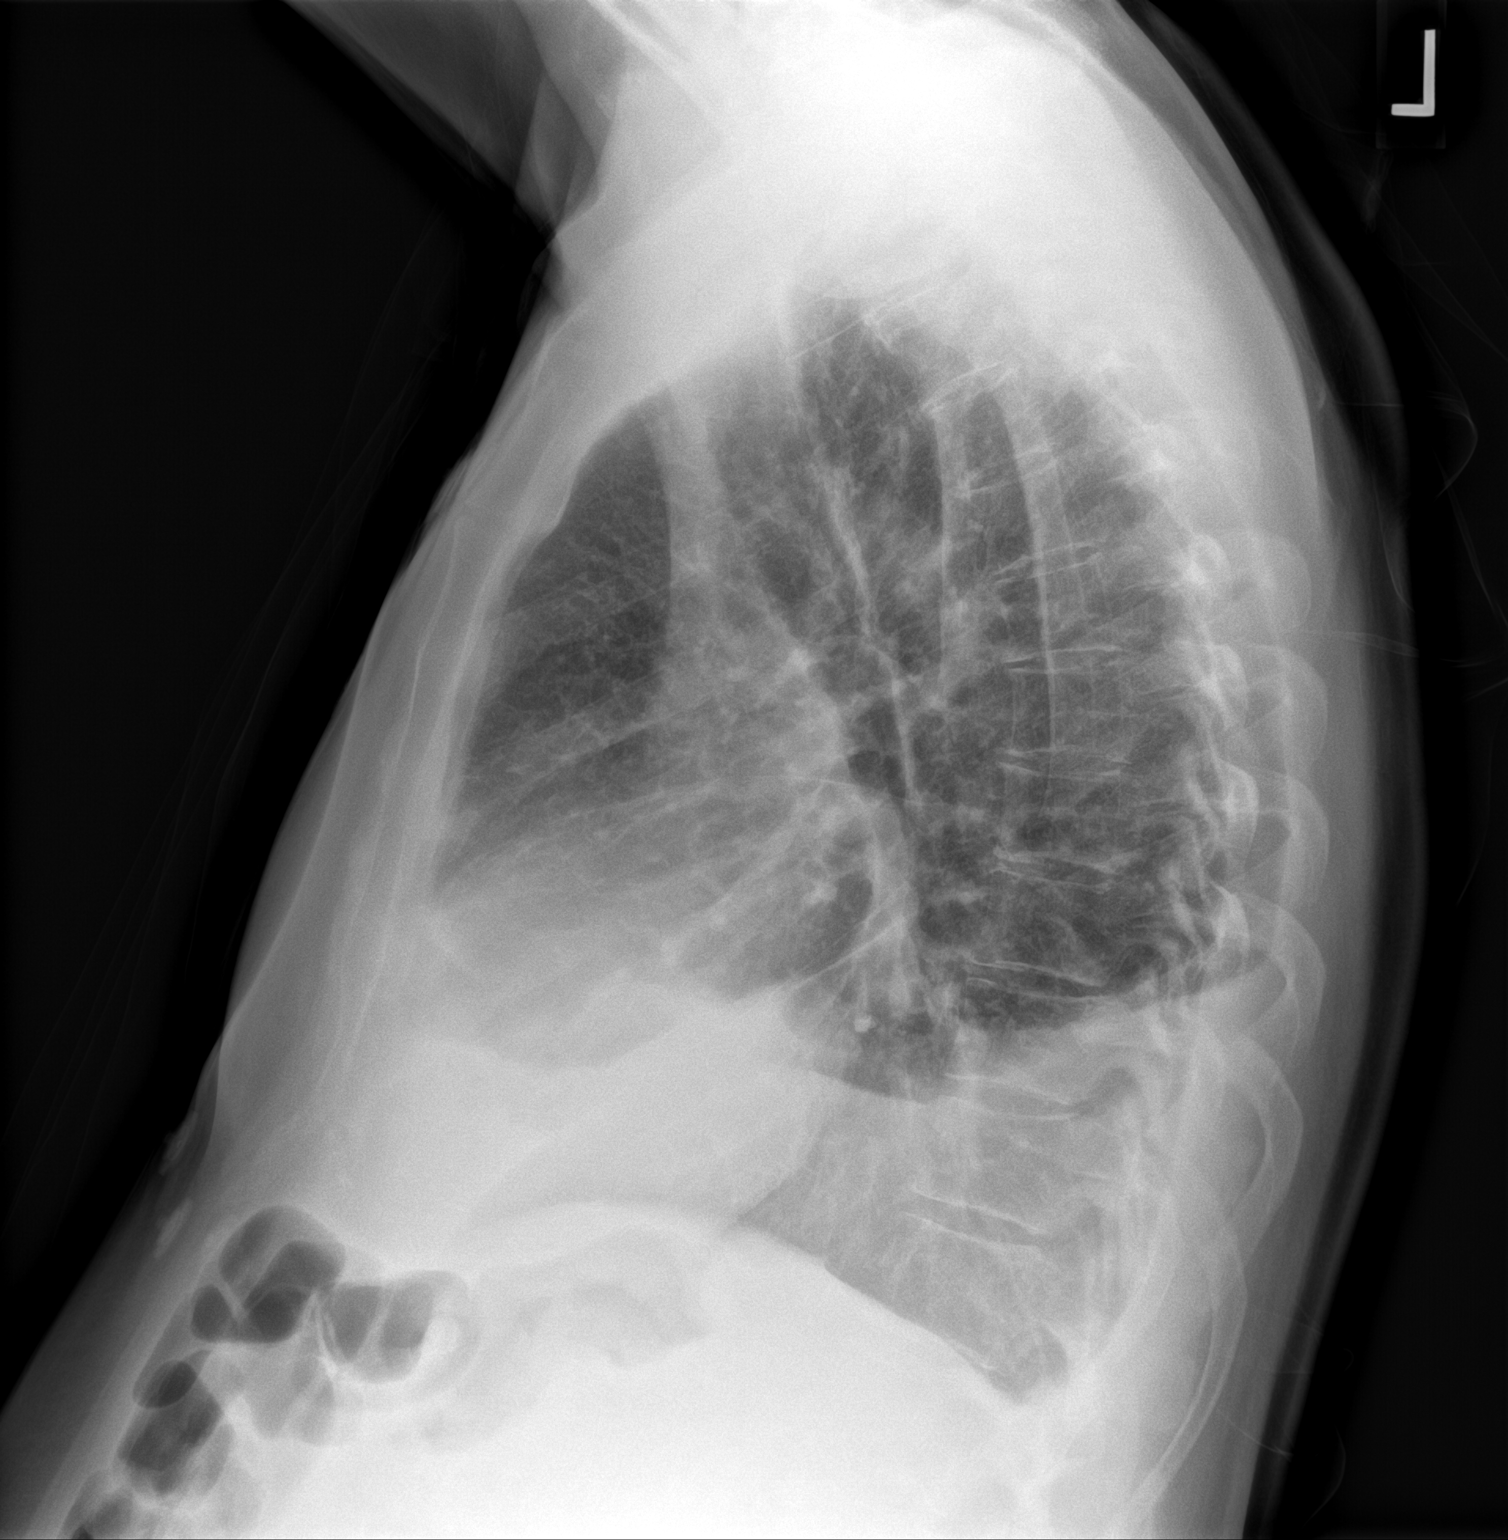

[2 of 2 positions shown; findings below may reference images not displayed]

FINDINGS: Similar versus slightly increased moderate right pleural effusion.
Similar overlying right basilar opacities. No visible pneumothorax.
Similar cardiomediastinal silhouette.
IMPRESSION: 1. Similar versus slightly increased moderate right pleural
effusion.
2. Similar overlying right basilar opacities, which could represent
atelectasis and/or pneumonia.

## 2021-02-13 MED ORDER — PERFLUTREN LIPID MICROSPHERE
1.0000 mL | INTRAVENOUS | Status: AC | PRN
  Administered 2021-02-13: 2 mL via INTRAVENOUS

## 2021-02-13 MED ORDER — FUROSEMIDE 10 MG/ML IJ SOLN
80.0000 mg | Freq: Once | INTRAMUSCULAR | Status: AC
Start: 2021-02-13 — End: 2021-02-13
  Administered 2021-02-13: 80 mg via INTRAVENOUS
  Filled 2021-02-13: qty 8

## 2021-02-13 NOTE — ED Provider Notes (Signed)
MEDC[facility] EMERGENCY DEPARTMENT Provider Note   CSN: [id number] Arrival date & time: 02/13/21 1628  History Chief Complaint  Patient presents with   Leg Swelling    Erik Chan is a 66 y.o. male with history of COPD was recently referred from PCP to Cardiology for leg swelling, DOE and SOB. Outpatient xray showed pleural effusion and abd US showed mild ascites. He was back to the Cardiology clinic today for an echo and was noted to have EF of <20%. He was sent from the Cardiology clinic to the ED for admission. He reports swelling of his legs up to his groin and lower abdomen. He has been on Lasix with minimal improvement.    Home Medications Prior to Admission medications   Medication Sig Start Date End Date Taking? Authorizing Provider  albuterol (VENTOLIN HFA) 108 (90 Base) MCG/ACT inhaler Inhale into the lungs. 01/28/21   [provider]  furosemide (LASIX) 40 MG tablet Take 1 tablet (40 mg) in the AM, and 1 tablet (40 mg) in the PM 02/01/21   Lennette Bihari, MD  losartan (COZAAR) 25 MG tablet Take 1 tablet (25 mg total) by mouth daily. 02/05/21 05/06/21  Lennette Bihari, MD  metoprolol succinate (TOPROL XL) 25 MG 24 hr tablet Take 0.5 tablet (12.5 mg) for 4 days, then increase to 1 tablet (25 mg) daily 02/01/21   Lennette Bihari, MD     Allergies    Shellfish-derived products   Review of Systems   Review of Systems Please see HPI for pertinent positives and negatives  Physical Exam BP (!) 150/119 (BP Location: Left Arm)    Pulse 99    Temp 98.6 F (37 C)    Resp 17    Ht 6\' 2"  (1.88 m)    Wt 102.7 kg    SpO2 98%    BMI 29.06 kg/m   Physical Exam Vitals and nursing note reviewed.  Constitutional:      Appearance: Normal appearance.  HENT:     Head: Normocephalic and atraumatic.     Nose: Nose normal.     Mouth/Throat:     Mouth: Mucous membranes are moist.  Eyes:     Extraocular Movements: Extraocular movements intact.      Conjunctiva/sclera: Conjunctivae normal.  Cardiovascular:     Rate and Rhythm: Normal rate.  Pulmonary:     Effort: Pulmonary effort is normal.     Comments: Diminished breath sounds in R lower lung field Abdominal:     General: Abdomen is flat.     Palpations: Abdomen is soft.     Tenderness: There is no abdominal tenderness.  Musculoskeletal:        General: No swelling. Normal range of motion.     Cervical back: Neck supple.     Right lower leg: Edema present.     Left lower leg: Edema present.     Comments: BLE edema to the umbilicus, including scrotal edema  Skin:    General: Skin is warm and dry.  Neurological:     General: No focal deficit present.     Mental Status: He is alert.  Psychiatric:        Mood and Affect: Mood normal.    ED Results / Procedures / Treatments   EKG EKG Interpretation  Date/Time:  Wednesday February 13 2021 16:48:49 EST Ventricular Rate:  88 PR Interval:  182 QRS Duration: 98 QT Interval:  402 QTC Calculation: 486 R Axis:   99  Text Interpretation: Sinus rhythm with Premature atrial complexes Rightward axis Cannot rule out Anterior infarct Abnormal ECG No old tracing to compare Confirmed by Susy Frizzle [phone]) on 02/13/2021 5:15:39 PM  Procedures Procedures  Medications Ordered in the ED Medications  furosemide (LASIX) injection 80 mg (80 mg Intravenous Given 02/13/21 2209)    Initial Impression and Plan  Patient with new diagnosis of acute CHF with low EF in Cards clinic today, sent here for evaluation. Labs done in triage show normal CBC and BMP. BNP is elevated. CXR shows pleural effusion. Will page Cardiology for admission. Lasix ordered to begin diuresis.   ED Course   Clinical Course as of 02/13/21 2213  Wed Feb 13, 2021  2212 Spoke with Dr. Burt Knack, Cardiology, who will accept for admission.  [CS]    Clinical Course User Index [CS] Pollyann Savoy, MD     MDM Rules/Calculators/A&P Medical Decision Making Problems  Addressed: Acute systolic congestive heart failure [facility]): acute illness or injury that poses a threat to life or bodily functions  Amount and/or Complexity of Data Reviewed External Data Reviewed: radiology and notes.    Details: Cardiology Labs:  Decision-making details documented in ED Course. Radiology: ordered and independent interpretation performed. Decision-making details documented in ED Course. ECG/medicine tests: ordered and independent interpretation performed.  Risk Decision regarding hospitalization.    Final Clinical Impression(s) / ED Diagnoses Final diagnoses:  Acute systolic congestive heart failure [facility])    Rx / DC Orders ED Discharge Orders     None        Pollyann Savoy, MD 02/13/21 2213

## 2021-02-13 NOTE — ED Triage Notes (Signed)
Pt had and echocardiogram today and following the results was told to go to the [facility] ED for admission. Pt's paperwork stated decompensated HF with an LVEF less than 20%. Pt reports ShOB with exertional dyspnea. Denies CP.

## 2021-02-13 NOTE — Progress Notes (Signed)
OFFICE NOTE  Chief Complaint:  Shortness of breath  Primary Care Physician: Erik Hazel, MD  HPI:  Erik Chan is a 66 y.o. male patient of Erik Chan with a past medial history significant for tobacco abuse and COPD, recently seen by Dr. Tresa Chan for several weeks of shortness of breath.  He had been tested for COVID-19, RSV and flu all of which were negative and started on Lasix.  Laboratory work showed a high BNP of 2000.  In early December he had an abdominal ultrasound which showed a small amount of perihepatic ascites.  He was seen by Dr. Tresa Chan and thought to have heart failure.  He recommended increasing his Lasix to 60 mg in the morning and 40 mg in the evening and starting metoprolol.  An echocardiogram was ordered.  Erik Chan reports since starting the increased dose of Lasix his breathing is improved somewhat although minimally.  He is still persistently swollen, has difficulty laying flat, has to sleep upright in a recliner at an angle and gets short of breath with minimal exertion.  An echocardiogram was performed today at the [facility] office and I was asked to see him as the doctor of the day.  Brief review of the echo demonstrates LVEF less than 20% with probable global hypokinesis.  Definity study was performed which showed trabeculation and false tendon a at the apex but no definitive thrombus.  PMHx:  Past Medical History:  Diagnosis Date   COPD (chronic obstructive pulmonary disease) (HCC)     No past surgical history on file.  FAMHx:  Family History  Problem Relation Age of Onset   Asthma Mother    Allergies Mother     SOCHx:   reports that he has quit smoking. His smoking use included pipe and cigarettes. He has a 30.00 pack-year smoking history. He has never used smokeless tobacco. He reports that he does not drink alcohol and does not use drugs.  ALLERGIES:  Allergies  Allergen Reactions   Shellfish-Derived Products Hives    ROS: Pertinent items  noted in HPI and remainder of comprehensive ROS otherwise negative.  HOME MEDS: Current Outpatient Medications on File Prior to Visit  Medication Sig Dispense Refill   albuterol (VENTOLIN HFA) 108 (90 Base) MCG/ACT inhaler Inhale into the lungs.     furosemide (LASIX) 40 MG tablet Take 1 tablet (40 mg) in the AM, and 1 tablet (40 mg) in the PM 180 tablet 1   losartan (COZAAR) 25 MG tablet Take 1 tablet (25 mg total) by mouth daily. 90 tablet 3   metoprolol succinate (TOPROL XL) 25 MG 24 hr tablet Take 0.5 tablet (12.5 mg) for 4 days, then increase to 1 tablet (25 mg) daily 90 tablet 1   Current Facility-Administered Medications on File Prior to Visit  Medication Dose Route Frequency Provider Last Rate Last Admin   perflutren lipid microspheres (DEFINITY) IV suspension  1-10 mL Intravenous PRN Erik Bihari, MD   2 mL at 02/13/21 1552    LABS/IMAGING: No results found for this or any previous visit (from the past 48 hour(s)). No results found.  LIPID PANEL:    Component Value Date/Time   CHOL 133 02/01/2021 1655   TRIG 115 02/01/2021 1655   HDL 35 (L) 02/01/2021 1655   CHOLHDL 3.8 02/01/2021 1655   LDLCALC 77 02/01/2021 1655     WEIGHTS: Wt Readings from Last 3 Encounters:  02/13/21 219 lb (99.3 kg)  02/01/21 219 lb 6.4 oz (99.5  kg)  08/02/15 214 lb 6.4 oz (97.3 kg)    VITALS: BP 130/72    Pulse 86    Ht 6\' 2"  (1.88 m)    Wt 219 lb (99.3 kg)    SpO2 97%    BMI 28.12 kg/m   EXAM: General appearance: alert, no distress, and pale Neck: JVD - several cm above sternal notch, no carotid bruit, supple, symmetrical, trachea midline, and thyroid not enlarged, symmetric, no tenderness/mass/nodules Lungs: diminished breath sounds bilaterally, dullness to percussion RLL and RML, and rales bibasilar Heart: regular rate and rhythm, S1, S2 normal, S3 present, and diastolic murmur: early diastolic 2/6, blowing at apex Abdomen: Protuberant, mild fluid wave, no rebound or  guarding Extremities: edema 2+ bilateral pitting lower extremity Pulses: 2+ and symmetric Skin: Pale, cool, dry, acrocyanosis Neurologic: Grossly normal Psych: Pleasant  EKG: N/A  ASSESSMENT: Acute systolic congestive heart failure NYHA class IV symptoms, LVEF less than 20% Right pleural effusion Ascites, elevated LFT's COPD - recent tobacco cessation  PLAN: 1.   Mr. has acute systolic congestive heart failure and is found to have a very low EF less than 20% with NYHA class IV symptoms at rest.  I was contacted with an outpatient chest x-ray which showed a moderate right pleural effusion on December 28.  He also had recent ultrasound of the abdomen which showed some ascites.  Today on exam he has dullness to percussion of the right midlung.  He is short of breath and has difficulty laying flat.  Despite diuresis, he has not improved significantly and is significantly volume overloaded.  I think he would benefit from hospitalization for IV diuresis and possible right thoracentesis.  He will need additional work-up for the etiology of his heart failure and likely cardiac catheterization.  We attempted to directly admit him to [facility] hospital however there were no beds available and therefore I would recommend he get more urgent care in the [facility] emergency department where he can be diuresed and ultimately transferred to [facility] when a bed becomes available to the cardiology service.  I have notified the hospital cardiologists of his pending admission.  [name], MD, [facility], FACP  [place]   [facility]  Medical Director of the Advanced Lipid Disorders &  Cardiovascular Risk Reduction Clinic Diplomate of the American Board of Clinical Lipidology Attending Cardiologist  Direct Dial: [phone]   Fax: [phone]  Website:  [email or web address] Donita Newland 02/13/2021, 4:32 PM

## 2021-02-14 ENCOUNTER — Inpatient Hospital Stay: Payer: Self-pay

## 2021-02-14 DIAGNOSIS — J90 Pleural effusion, not elsewhere classified: Secondary | ICD-10-CM | POA: Diagnosis not present

## 2021-02-14 DIAGNOSIS — I5082 Biventricular heart failure: Secondary | ICD-10-CM | POA: Diagnosis present

## 2021-02-14 DIAGNOSIS — J439 Emphysema, unspecified: Secondary | ICD-10-CM | POA: Diagnosis present

## 2021-02-14 DIAGNOSIS — I509 Heart failure, unspecified: Secondary | ICD-10-CM | POA: Diagnosis present

## 2021-02-14 DIAGNOSIS — I428 Other cardiomyopathies: Secondary | ICD-10-CM | POA: Diagnosis present

## 2021-02-14 DIAGNOSIS — I5021 Acute systolic (congestive) heart failure: Secondary | ICD-10-CM | POA: Diagnosis not present

## 2021-02-14 DIAGNOSIS — Z87891 Personal history of nicotine dependence: Secondary | ICD-10-CM | POA: Diagnosis not present

## 2021-02-14 DIAGNOSIS — I5022 Chronic systolic (congestive) heart failure: Secondary | ICD-10-CM

## 2021-02-14 DIAGNOSIS — E871 Hypo-osmolality and hyponatremia: Secondary | ICD-10-CM | POA: Diagnosis present

## 2021-02-14 DIAGNOSIS — Z91013 Allergy to seafood: Secondary | ICD-10-CM | POA: Diagnosis not present

## 2021-02-14 DIAGNOSIS — Z79899 Other long term (current) drug therapy: Secondary | ICD-10-CM | POA: Diagnosis not present

## 2021-02-14 DIAGNOSIS — Z20822 Contact with and (suspected) exposure to covid-19: Secondary | ICD-10-CM | POA: Diagnosis present

## 2021-02-14 DIAGNOSIS — I2510 Atherosclerotic heart disease of native coronary artery without angina pectoris: Secondary | ICD-10-CM | POA: Diagnosis present

## 2021-02-14 DIAGNOSIS — I472 Ventricular tachycardia, unspecified: Secondary | ICD-10-CM | POA: Diagnosis present

## 2021-02-14 DIAGNOSIS — R188 Other ascites: Secondary | ICD-10-CM | POA: Diagnosis present

## 2021-02-14 DIAGNOSIS — Z9889 Other specified postprocedural states: Secondary | ICD-10-CM | POA: Diagnosis not present

## 2021-02-14 DIAGNOSIS — J918 Pleural effusion in other conditions classified elsewhere: Secondary | ICD-10-CM | POA: Diagnosis present

## 2021-02-14 DIAGNOSIS — I3139 Other pericardial effusion (noninflammatory): Secondary | ICD-10-CM | POA: Diagnosis not present

## 2021-02-14 DIAGNOSIS — I5043 Acute on chronic combined systolic (congestive) and diastolic (congestive) heart failure: Secondary | ICD-10-CM | POA: Diagnosis not present

## 2021-02-14 DIAGNOSIS — E876 Hypokalemia: Secondary | ICD-10-CM | POA: Diagnosis present

## 2021-02-14 DIAGNOSIS — I493 Ventricular premature depolarization: Secondary | ICD-10-CM | POA: Diagnosis present

## 2021-02-14 DIAGNOSIS — M797 Fibromyalgia: Secondary | ICD-10-CM | POA: Diagnosis present

## 2021-02-14 DIAGNOSIS — I5023 Acute on chronic systolic (congestive) heart failure: Secondary | ICD-10-CM | POA: Diagnosis present

## 2021-02-14 DIAGNOSIS — Z825 Family history of asthma and other chronic lower respiratory diseases: Secondary | ICD-10-CM | POA: Diagnosis not present

## 2021-02-14 DIAGNOSIS — E119 Type 2 diabetes mellitus without complications: Secondary | ICD-10-CM | POA: Diagnosis present

## 2021-02-14 DIAGNOSIS — Z7984 Long term (current) use of oral hypoglycemic drugs: Secondary | ICD-10-CM | POA: Diagnosis not present

## 2021-02-14 LAB — COOXEMETRY PANEL
Carboxyhemoglobin: 1.1 % (ref 0.5–1.5)
Methemoglobin: 0.8 % (ref 0.0–1.5)
O2 Saturation: 51.5 %
Total hemoglobin: 13 g/dL (ref 12.0–16.0)

## 2021-02-14 LAB — BASIC METABOLIC PANEL
Anion gap: 8 (ref 5–15)
BUN: 13 mg/dL (ref 8–23)
CO2: 27 mmol/L (ref 22–32)
Calcium: 8 mg/dL — ABNORMAL LOW (ref 8.9–10.3)
Chloride: 97 mmol/L — ABNORMAL LOW (ref 98–111)
Creatinine, Ser: 0.96 mg/dL (ref 0.61–1.24)
GFR, Estimated: >60 mL/min (ref >60)
Glucose, Bld: 181 mg/dL — ABNORMAL HIGH (ref 70–99)
Potassium: 3 mmol/L — ABNORMAL LOW (ref 3.5–5.1)
Sodium: 132 mmol/L — ABNORMAL LOW (ref 135–145)

## 2021-02-14 LAB — TROPONIN I (HIGH SENSITIVITY)
Troponin I (High Sensitivity): 28 ng/L — ABNORMAL HIGH (ref <18)
Troponin I (High Sensitivity): 25 ng/L — ABNORMAL HIGH (ref <18)

## 2021-02-14 LAB — HIV ANTIBODY (ROUTINE TESTING W REFLEX): HIV Screen 4th Generation wRfx: NONREACTIVE

## 2021-02-14 MED ORDER — AMIODARONE HCL IN DEXTROSE 360-4.14 MG/200ML-% IV SOLN
60.0000 mg/h | INTRAVENOUS | Status: AC
  Administered 2021-02-14 (×2): 60 mg/h via INTRAVENOUS
  Filled 2021-02-14: qty 200

## 2021-02-14 MED ORDER — SODIUM CHLORIDE 0.9% FLUSH: 10.0000 mL | INTRAVENOUS | Status: DC | PRN

## 2021-02-14 MED ORDER — POTASSIUM CHLORIDE CRYS ER 20 MEQ PO TBCR: 60.0000 meq | EXTENDED_RELEASE_TABLET | Freq: Once | ORAL | Status: DC

## 2021-02-14 MED ORDER — PNEUMOCOCCAL VAC POLYVALENT 25 MCG/0.5ML IJ INJ
0.5000 mL | INJECTION | INTRAMUSCULAR | Status: DC
  Filled 2021-02-14: qty 0.5

## 2021-02-14 MED ORDER — METOPROLOL SUCCINATE ER 25 MG PO TB24
25.0000 mg | ORAL_TABLET | Freq: Every day | ORAL | Status: DC
  Filled 2021-02-14: qty 1

## 2021-02-14 MED ORDER — FUROSEMIDE 10 MG/ML IJ SOLN: 80.0000 mg | Freq: Every day | INTRAMUSCULAR | Status: DC

## 2021-02-14 MED ORDER — ONDANSETRON HCL 4 MG/2ML IJ SOLN: 4.0000 mg | Freq: Four times a day (QID) | INTRAMUSCULAR | Status: DC | PRN

## 2021-02-14 MED ORDER — POTASSIUM CHLORIDE CRYS ER 20 MEQ PO TBCR: 40.0000 meq | EXTENDED_RELEASE_TABLET | Freq: Once | ORAL | Status: DC

## 2021-02-14 MED ORDER — POTASSIUM CHLORIDE CRYS ER 10 MEQ PO TBCR
20.0000 meq | EXTENDED_RELEASE_TABLET | Freq: Two times a day (BID) | ORAL | Status: DC
  Administered 2021-02-15 – 2021-02-17 (×6): 20 meq via ORAL
  Filled 2021-02-14 (×9): qty 2

## 2021-02-14 MED ORDER — SODIUM CHLORIDE 0.9 % IV SOLN: 250.0000 mL | INTRAVENOUS | Status: DC | PRN

## 2021-02-14 MED ORDER — SPIRONOLACTONE 12.5 MG HALF TABLET
12.5000 mg | ORAL_TABLET | Freq: Every day | ORAL | Status: DC
  Administered 2021-02-15 – 2021-02-17 (×3): 12.5 mg via ORAL
  Filled 2021-02-14 (×3): qty 1

## 2021-02-14 MED ORDER — AMIODARONE HCL 200 MG PO TABS
200.0000 mg | ORAL_TABLET | Freq: Two times a day (BID) | ORAL | Status: DC
  Administered 2021-02-14: 200 mg via ORAL
  Filled 2021-02-14: qty 1

## 2021-02-14 MED ORDER — ALBUTEROL SULFATE (2.5 MG/3ML) 0.083% IN NEBU: 3.0000 mL | INHALATION_SOLUTION | Freq: Four times a day (QID) | RESPIRATORY_TRACT | Status: DC | PRN

## 2021-02-14 MED ORDER — FUROSEMIDE 10 MG/ML IJ SOLN
80.0000 mg | Freq: Two times a day (BID) | INTRAMUSCULAR | Status: DC
  Administered 2021-02-14 – 2021-02-16 (×5): 80 mg via INTRAVENOUS
  Filled 2021-02-14 (×5): qty 8

## 2021-02-14 MED ORDER — MILRINONE LACTATE IN DEXTROSE 20-5 MG/100ML-% IV SOLN
0.2500 ug/kg/min | INTRAVENOUS | Status: DC
Start: 2021-02-14 — End: 2021-02-22
  Administered 2021-02-14 – 2021-02-15 (×2): 0.125 ug/kg/min via INTRAVENOUS
  Administered 2021-02-16 – 2021-02-22 (×12): 0.25 ug/kg/min via INTRAVENOUS
  Filled 2021-02-14 (×15): qty 100

## 2021-02-14 MED ORDER — ACETAMINOPHEN 325 MG PO TABS: 650.0000 mg | ORAL_TABLET | ORAL | Status: DC | PRN

## 2021-02-14 MED ORDER — SODIUM CHLORIDE 0.9% FLUSH
10.0000 mL | Freq: Two times a day (BID) | INTRAVENOUS | Status: DC
  Administered 2021-02-14 – 2021-02-15 (×3): 10 mL

## 2021-02-14 MED ORDER — CHLORHEXIDINE GLUCONATE CLOTH 2 % EX PADS
6.0000 | MEDICATED_PAD | Freq: Every day | CUTANEOUS | Status: DC
  Administered 2021-02-14 – 2021-02-26 (×13): 6 via TOPICAL

## 2021-02-14 MED ORDER — SODIUM CHLORIDE 0.9% FLUSH
3.0000 mL | Freq: Two times a day (BID) | INTRAVENOUS | Status: DC
  Administered 2021-02-14 (×2): 3 mL via INTRAVENOUS

## 2021-02-14 MED ORDER — SODIUM CHLORIDE 0.9% FLUSH: 3.0000 mL | INTRAVENOUS | Status: DC | PRN

## 2021-02-14 MED ORDER — ENOXAPARIN SODIUM 40 MG/0.4ML IJ SOSY
40.0000 mg | PREFILLED_SYRINGE | INTRAMUSCULAR | Status: DC
  Administered 2021-02-14 – 2021-02-19 (×6): 40 mg via SUBCUTANEOUS
  Filled 2021-02-14 (×6): qty 0.4

## 2021-02-14 MED ORDER — POTASSIUM CHLORIDE CRYS ER 20 MEQ PO TBCR
60.0000 meq | EXTENDED_RELEASE_TABLET | ORAL | Status: AC
  Administered 2021-02-14 (×2): 60 meq via ORAL
  Filled 2021-02-14 (×2): qty 3

## 2021-02-14 MED ORDER — AMIODARONE HCL IN DEXTROSE 360-4.14 MG/200ML-% IV SOLN
30.0000 mg/h | INTRAVENOUS | Status: DC
  Administered 2021-02-14 – 2021-02-24 (×21): 30 mg/h via INTRAVENOUS
  Filled 2021-02-14 (×21): qty 200

## 2021-02-14 MED ORDER — LOSARTAN POTASSIUM 25 MG PO TABS
25.0000 mg | ORAL_TABLET | Freq: Every day | ORAL | Status: DC
  Filled 2021-02-14: qty 1

## 2021-02-14 NOTE — Progress Notes (Signed)
Orthopedic Tech Progress Note Patient Details:  Erik Chan 08-21-1955 [id number]  Fresh unna boots applied bilaterally.  Ortho Devices Type of Ortho Device: Radio broadcast assistant Ortho Device/Splint Location: BLE Ortho Device/Splint Interventions: Ordered, Application   Post Interventions Patient Tolerated: Well Instructions Provided: Care of device  Erik Chan [name] 02/14/2021, 6:03 PM

## 2021-02-14 NOTE — TOC Initial Note (Signed)
Transition of Care [facility]) - Initial/Assessment Note    Patient Details  Name: Erik Chan MRN: [id number] Date of Birth: 1955/06/25  Transition of Care [facility]) CM/SW Contact:    Lockie Pares, RN Phone Number: 02/14/2021, 2:48 PM  Clinical Narrative:                  66 yo male admitted with shob and leg swelling x 3 months as a direct admit from office. ECHO revealed EF<20%, patient is obtaining a PICC line today to look at CVP and co-ox, may need milrenone, inotopic agents. Will need F/U at HF clinic. PT ordered to assess patient for recommendations. If he does start/need inotriic support IV at home will need [facility] RN and set up with infusion services.   CM to follow for needs, recommendations, and transitions. Expected Discharge Plan: Home w Home Health Services Barriers to Discharge: Continued Medical Work up   Patient Goals and CMS Choice        Expected Discharge Plan and Services Expected Discharge Plan: Home w Home Health Services   Discharge Planning Services: CM Consult, HF Clinic   Living arrangements for the past 2 months: Apartment                                      Prior Living Arrangements/Services Living arrangements for the past 2 months: Apartment Lives with:: Spouse Patient language and need for interpreter reviewed:: Yes        Need for Family Participation in Patient Care: Yes (Comment) Care giver support system in place?: Yes (comment)   Criminal Activity/Legal Involvement Pertinent to Current Situation/Hospitalization: No - Comment as needed  Activities of Daily Living Home Assistive Devices/Equipment: None ADL Screening (condition at time of admission) Patient's cognitive ability adequate to safely complete daily activities?: Yes Is the patient deaf or have difficulty hearing?: No Does the patient have difficulty seeing, even when wearing glasses/contacts?: No Does the patient have difficulty concentrating, remembering, or making  decisions?: No Patient able to express need for assistance with ADLs?: Yes Does the patient have difficulty dressing or bathing?: No Independently performs ADLs?: Yes (appropriate for developmental age) Does the patient have difficulty walking or climbing stairs?: No Weakness of Legs: Both Weakness of Arms/Hands: None  Permission Sought/Granted                  Emotional Assessment       Orientation: : Oriented to Self, Oriented to Place, Oriented to  Time, Oriented to Situation Alcohol / Substance Use: Not Applicable (prev smoker quit 5 weeks ago) Psych Involvement: No (comment)  Admission diagnosis:  Acute CHF (HCC) [I50.9] Acute systolic congestive heart failure (HCC) [I50.21] Heart failure (HCC) [I50.9] Patient Active Problem List   Diagnosis Date Noted   Heart failure (HCC) 02/14/2021   Acute CHF (HCC) 02/13/2021   Asthmatic bronchitis with acute exacerbation 08/03/2015   Upper airway cough syndrome 08/03/2015   CAP (community acquired pneumonia) 08/03/2015   Cigarette smoker 08/03/2015   PCP:  [name], MD Pharmacy:   [facility] [id number] - [address], [place] - [address] [place] [address] Phone: [phone] Fax: [phone]     Social Determinants of Health (SDOH) Interventions    Readmission Risk Interventions No flowsheet data found.

## 2021-02-14 NOTE — Progress Notes (Addendum)
Advanced Heart Failure Rounding Note  PCP-Cardiologist: Dr Tresa Endo   Subjective:   Recently established with Dr Tresa Endo. Has had worsening lower extremity edema despite increased diuretics at home. Echo set up.   Yesterday he saw Dr Rennis Golden in the office. Echo Ef < 20% , RV severely reduced. LA/RA severely dilated. He was sent for hospital admit with additional work up.   Admitted earlier and started on 80 IV lasix twice a day. BNP 1831. Creatinine 0.9   Denies chest pain. Having ongoing leg edema.   Objective:   Weight Range: 99.2 kg Body mass index is 28.07 kg/m.   Vital Signs:   Temp:  [97.3 F (36.3 C)-98.6 F (37 C)] 97.8 F (36.6 C) (01/05 0757) Pulse Rate:  [82-99] 87 (01/05 0757) Resp:  [15-30] 21 (01/05 0757) BP: (93-150)/(63-119) 102/72 (01/05 0757) SpO2:  [94 %-100 %] 94 % (01/05 0757) Weight:  [99.2 kg-102.7 kg] 99.2 kg (01/05 0150) Last BM Date: 02/13/21  Weight change: Filed Weights   02/13/21 1657 02/14/21 0150  Weight: 102.7 kg 99.2 kg    Intake/Output:   Intake/Output Summary (Last 24 hours) at 02/14/2021 0853 Last data filed at 02/14/2021 0752 Gross per 24 hour  Intake 60 ml  Output 575 ml  Net -515 ml      Physical Exam    General:  No resp difficulty HEENT: Normal Neck: Supple. JVP to jaw  Carotids 2+ bilat; no bruits. No lymphadenopathy or thyromegaly appreciated. Cor: PMI nondisplaced. Regular rate & rhythm. No rubs, gallops or murmurs. Lungs: Clear Abdomen: Soft, nontender, nondistended. No hepatosplenomegaly. No bruits or masses. Good bowel sounds. Extremities: No cyanosis, clubbing, rash, R and LLE 3+ edema Neuro: Alert & orientedx3, cranial nerves grossly intact. moves all 4 extremities w/o difficulty. Affect pleasant   Telemetry   SR with frequent PVCs+ NSVT  80-90s   EKG    SR 88 bpm  Labs    CBC Recent Labs    02/13/21 1709  WBC 7.9  HGB 13.1  HCT 40.4  MCV 89.4  PLT 133*   Basic Metabolic Panel Recent Labs     02/13/21 1709  NA 134*  K 3.5  CL 94*  CO2 30  GLUCOSE 104*  BUN 18  CREATININE 0.93  CALCIUM 9.0   Liver Function Tests No results for input(s): AST, ALT, ALKPHOS, BILITOT, PROT, ALBUMIN in the last 72 hours. No results for input(s): LIPASE, AMYLASE in the last 72 hours. Cardiac Enzymes No results for input(s): CKTOTAL, CKMB, CKMBINDEX, TROPONINI in the last 72 hours.  BNP: BNP (last 3 results) Recent Labs    02/01/21 1522 02/13/21 1709  BNP 2,411.9* 1,831.1*    ProBNP (last 3 results) No results for input(s): PROBNP in the last 8760 hours.   D-Dimer No results for input(s): DDIMER in the last 72 hours. Hemoglobin A1C No results for input(s): HGBA1C in the last 72 hours. Fasting Lipid Panel No results for input(s): CHOL, HDL, LDLCALC, TRIG, CHOLHDL, LDLDIRECT in the last 72 hours. Thyroid Function Tests No results for input(s): TSH, T4TOTAL, T3FREE, THYROIDAB in the last 72 hours.  Invalid input(s): FREET3  Other results:   Imaging    DG Chest 2 View  Result Date: 02/13/2021 CLINICAL DATA:  CHF EXAM: CHEST - 2 VIEW COMPARISON:  02/06/2021. FINDINGS: Similar versus slightly increased moderate right pleural effusion. Similar overlying right basilar opacities. No visible pneumothorax. Similar cardiomediastinal silhouette. IMPRESSION: 1. Similar versus slightly increased moderate right pleural effusion. 2. Similar overlying  right basilar opacities, which could represent atelectasis and/or pneumonia. Electronically Signed   By: Margaretha Sheffield M.D.   On: 02/13/2021 17:27   ECHOCARDIOGRAM COMPLETE  Result Date: 02/13/2021    ECHOCARDIOGRAM REPORT   Patient Name:   Erik Chan Date of Exam: 02/13/2021 Medical Rec #:  [id number]       Height:       74.0 in Accession #:    [id number]      Weight:       219.4 lb Date of Birth:  08/08/1955       BSA:          2.261 m Patient Age:    66 years        BP:           110/60 mmHg Patient Gender: M               HR:           92  bpm. Exam Location:  Outpatient Procedure: 2D Echo, Cardiac Doppler, Color Doppler and Intracardiac            Opacification Agent Indications:    R06.02 SOB; R60.0 Lower extremity edema  History:        Patient has no prior history of Echocardiogram examinations.                 COPD, Signs/Symptoms:Shortness of Breath and Edema; Risk                 Factors:Current Smoker. Patient denies chest pain but has had                 extreme SOB and leg swelling x 3 months.  Sonographer:    Salvadore Dom RVT, RDCS (AE), RDMS Referring Phys: [place]  1. Left ventricular ejection fraction, by estimation, is <20%. Left ventricular ejection fraction by 3D volume is 13 %. The left ventricle has severely decreased function. The left ventricle demonstrates global hypokinesis. The left ventricular internal  cavity size was moderately dilated. Left ventricular diastolic parameters are indeterminate. There is no evidence of LV thrombus on contrasted study.  2. Right ventricular systolic function is severely reduced. The right ventricular size is mildly enlarged. There is normal pulmonary artery systolic pressure. The estimated right ventricular systolic pressure is [id number] mmHg.  3. Left atrial size was severely dilated.  4. Right atrial size was severely dilated.  5. A small pericardial effusion is present. The pericardial effusion is localized near the right atrium.  6. The mitral valve is abnormal. Moderate to severe mitral valve regurgitation. Regurgitation volume 52 mL and ERO 0.51 cm2.  7. The aortic valve is tricuspid. Aortic valve regurgitation is mild. No aortic stenosis is present.  8. The inferior vena cava is dilated in size with <50% respiratory variability, suggesting right atrial pressure of 15 mmHg. Comparison(s): No prior Echocardiogram. CXR 02/06/2021 showed moderate right pleural effusion with clear left lung. FINDINGS  Left Ventricle: Left ventricular ejection fraction, by estimation, is  <20%. Left ventricular ejection fraction by 3D volume is 13 %. The left ventricle has severely decreased function. The left ventricle demonstrates global hypokinesis. Definity contrast agent was given IV to delineate the left ventricular endocardial borders. The left ventricular internal cavity size was moderately dilated. There is no left ventricular hypertrophy. Left ventricular diastolic parameters are indeterminate. Right Ventricle: The right ventricular size is mildly enlarged. No increase in right ventricular wall thickness. Right ventricular systolic function  is severely reduced. There is normal pulmonary artery systolic pressure. The tricuspid regurgitant velocity is 2.18 m/s, and with an assumed right atrial pressure of 15 mmHg, the estimated right ventricular systolic pressure is [id number] mmHg. Left Atrium: Left atrial size was severely dilated. Right Atrium: Right atrial size was severely dilated. Pericardium: A small pericardial effusion is present. The pericardial effusion is localized near the right atrium. Mitral Valve: The mitral valve is abnormal. Moderate to severe mitral valve regurgitation. Tricuspid Valve: The tricuspid valve is normal in structure. Tricuspid valve regurgitation is mild . No evidence of tricuspid stenosis. Aortic Valve: The aortic valve is tricuspid. Aortic valve regurgitation is mild. Aortic regurgitation PHT measures 447 msec. No aortic stenosis is present. Aortic valve mean gradient measures 2.0 mmHg. Aortic valve peak gradient measures 4.2 mmHg. Aortic  valve area, by VTI measures 1.71 cm. Pulmonic Valve: The pulmonic valve was grossly normal. Pulmonic valve regurgitation is mild. No evidence of pulmonic stenosis. Aorta: The aortic root, ascending aorta and aortic arch are all structurally normal, with no evidence of dilitation or obstruction. Venous: The inferior vena cava is dilated in size with less than 50% respiratory variability, suggesting right atrial pressure of 15  mmHg. IAS/Shunts: No atrial level shunt detected by color flow Doppler.  LEFT VENTRICLE PLAX 2D LVIDd:         6.53 cm LVIDs:         6.12 cm LV PW:         0.84 cm         3D Volume EF LV IVS:        0.48 cm         LV 3D EF:    Left LVOT diam:     2.30 cm                      ventricul LV SV:         28                           ar LV SV Index:   12                           ejection LVOT Area:     4.15 cm                     fraction                                             by 3D                                             volume is                                             13 %.                                 3D Volume EF:  3D EF:        13 %                                LV EDV:       196 ml                                LV ESV:       170 ml                                LV SV:        26 ml LEFT ATRIUM              Index        RIGHT ATRIUM           Index LA diam:        4.90 cm  2.17 cm/m   RA Area:     31.50 cm LA Vol (A2C):   155.0 ml 68.56 ml/m  RA Volume:   122.00 ml 53.97 ml/m LA Vol (A4C):   123.0 ml 54.41 ml/m LA Biplane Vol: 145.0 ml 64.14 ml/m  AORTIC VALVE                    PULMONIC VALVE AV Area (Vmax):    1.56 cm     PV Vmax:          0.59 m/s AV Area (Vmean):   1.43 cm     PV Peak grad:     1.4 mmHg AV Area (VTI):     1.71 cm     PR End Diast Vel: 8.41 msec AV Vmax:           103.00 cm/s AV Vmean:          73.200 cm/s AV VTI:            0.162 m AV Peak Grad:      4.2 mmHg AV Mean Grad:      2.0 mmHg LVOT Vmax:         38.60 cm/s LVOT Vmean:        25.200 cm/s LVOT VTI:          0.066 m LVOT/AV VTI ratio: 0.41 AI PHT:            447 msec AR Vena Contracta: 0.29 cm  AORTA Ao Root diam: 3.20 cm Ao Asc diam:  3.50 cm Ao Arch diam: 2.7 cm MITRAL VALVE                  TRICUSPID VALVE MV Area (PHT): 3.39 cm       TR Peak grad:   19.0 mmHg MV Decel Time: 224 msec       TR Vmax:        218.00 cm/s MR Peak grad:    55.4 mmHg MR Mean grad:    37.0 mmHg     SHUNTS MR Vmax:         372.00 cm/s  Systemic VTI:  0.07 m MR Vmean:        289.0 cm/s   Systemic Diam: 2.30 cm MR PISA:         7.60 cm MR PISA Eff ROA: 51 mm MR PISA Radius:  1.10 cm MV E  velocity: 67.10 cm/s Rudean Haskell MD Electronically signed by Rudean Haskell MD Signature Date/Time: 02/13/2021/4:56:18 PM    Final      Medications:     Scheduled Medications:  enoxaparin (LOVENOX) injection  40 mg Subcutaneous Q24H   furosemide  80 mg Intravenous BID   losartan  25 mg Oral Daily   metoprolol succinate  25 mg Oral Daily   [START ON 02/15/2021] pneumococcal 23 valent vaccine  0.5 mL Intramuscular Tomorrow-1000   sodium chloride flush  3 mL Intravenous Q12H    Infusions:  sodium chloride      PRN Medications: sodium chloride, acetaminophen, albuterol, ondansetron (ZOFRAN) IV, sodium chloride flush    Patient Profile   Erik Chan is a 66 y.o. male with tobacco abuse and COPD, recently seen by Dr. Claiborne Billings for several weeks of shortness of breath.   Assessment/Plan   Acute Biventricular HFrEF ECHO severely reduced EF < 20% and severely reduced RV.  ? PVC induced. ~ 20% PVC burden   -He has not had an ischemic work up.. H/O Tobacco abuse. Check  HS Trop. Will need ischemic work up after diuresed. Looks like he has high PVC burden. Marked volume overload. Continue lasix 80 mg twice a day.  - No room for GDMT with soft BP.  - Stop losartan and metoprolol.  - Place PICC for CVP and CO-OX. May need to add inotropes.  - Unna boots.   2. PVCs  High PVC burden 20%.  Given 200 mg amio then will load on amio drip. Marland Kitchen Hold off on drip with soft BP.   3. COPD Former heavy smoker. Quit smoking 5 week ago  Length of Stay: 0  Darrick Grinder, NP  02/14/2021, 8:53 AM  Advanced Heart Failure Team Pager [phone] (M-F; 7a - 5p)  Please contact [facility] for night-coverage after hours (5p -7a ) and weekends on [email or web address]  Patient seen and examined with the above-signed  Advanced Practice Provider and/or Housestaff. I personally reviewed laboratory data, imaging studies and relevant notes. I independently examined the patient and formulated the important aspects of the plan. I have edited the note to reflect any of my changes or salient points. I have personally discussed the plan with the patient and/or family.  Diuresing well. Feels a bite better. Denies orthopnea or PND. Still with frequent PVCs.   General:  Sitting up in bed No resp difficulty HEENT: normal Neck: supple.JVP to ear  Carotids 2+ bilat; no bruits. No lymphadenopathy or thryomegaly appreciated. Cor: PMI nondisplaced. Irregular rate & rhythm. No rubs, gallops or murmurs. Lungs: clear Abdomen: soft, nontender, nondistended. No hepatosplenomegaly. No bruits or masses. Good bowel sounds. Extremities: no cyanosis, clubbing, rash, 3+ edema Neuro: alert & orientedx3, cranial nerves grossly intact. moves all 4 extremities w/o difficulty. Affect pleasant  He is massively volume overloaded with severe biventricular dysfunction. 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 Place PICC. Follow co-ox and CVP. Sart amio for PVC suppression. R/L cath on Monday.   [name], MD  5:18 PM

## 2021-02-14 NOTE — Progress Notes (Signed)
Heart Failure Navigator Progress Note ° °Assessed for Heart & Vascular TOC clinic readiness.  °Patient does not meet criteria due to AHF rounding team consulted this admission.  ° °Navigator available for educational resources.  ° °Kaileena Obi, MSN, RN °Heart Failure Nurse Navigator °[phone] ° ° °

## 2021-02-14 NOTE — TOC Progression Note (Signed)
Transition of Care [facility]) - Progression Note    Patient Details  Name: Aldridge Krzyzanowski MRN: [id number] Date of Birth: 1955/09/07  Transition of Care [facility]) CM/SW Contact  Leone Haven, RN Phone Number: 02/14/2021, 9:23 AM  Clinical Narrative:     Transition of Care [facility]) Screening Note   Patient Details  Name: Brayn Eckstein Date of Birth: May 03, 1955   Transition of Care [facility]) CM/SW Contact:    Leone Haven, RN Phone Number: 02/14/2021, 9:23 AM    Transition of Care Department [facility]) has reviewed patient and no TOC needs have been identified at this time. We will continue to monitor patient advancement through interdisciplinary progression rounds. If new patient transition needs arise, please place a TOC consult.          Expected Discharge Plan and Services                                                 Social Determinants of Health (SDOH) Interventions    Readmission Risk Interventions No flowsheet data found.

## 2021-02-14 NOTE — Progress Notes (Signed)
Patient BP soft see flowsheet, NP on call notified  NP get in touch with Dr. Haroldine Laws. MD okay to give lasix and start Milrinone but notified MD if SBP <85.  Report given to night nurse.Will continue to monitor.

## 2021-02-14 NOTE — H&P (Signed)
Cardiology Admission History and Physical:   Patient ID: Erik Chan MRN: [id number]; DOB: 1955/05/05   Admission date: 02/13/2021  Primary Care Provider: Kathyrn Lass, MD [facility] Cardiologist: Shelva Majestic MD [place] Electrophysiologist:  None   Chief Complaint: Shortness of Breath  Patient Profile:   Erik Chan is a 66 y.o. male with tobacco abuse and COPD, recently seen by Dr. Claiborne Billings for several weeks of shortness of breath.   History of Present Illness:   Erik Chan was recently seen by Dr Claiborne Billings for shortness of breath. In early December he had an abdominal ultrasound given elevated LFTs and swelling, which showed a small amount of perihepatic ascites.  He was seen by Dr. Claiborne Billings and thought to have heart failure. He recommended increasing his Lasix to 60 mg in the morning and 40 mg in the evening and starting metoprolol.  An echocardiogram was ordered.  Erik Chan reports since starting the increased dose of Lasix his breathing is improved somewhat although minimally.  He is still persistently swollen, has difficulty laying flat, has to sleep upright in a recliner at an angle and gets short of breath with minimal exertion.  An echocardiogram was performed today at the [facility] office that demonstrated LVEF less than 20% with probable global hypokinesis.  Definity study was performed which showed trabeculation and false tendon at the apex but no definitive thrombus.  He has been tested for COVID-19, RSV and flu all of which were negative and started on Lasix.  Laboratory work showed a elevated BNP of 1,831. His other lab work is otherwise unremarkable. He received IV lasix 80mg  in the [facility] ED and already feels better after a few hours. He had CXR performed that shows a moderate pleural effusion.    Past Medical History:  Diagnosis Date   CHF (congestive heart failure) ([place]) 02/13/2021   LVEF less than 20%   COPD (chronic obstructive pulmonary disease)  (HCC)     No past surgical history on file.   Medications Prior to Admission: Prior to Admission medications   Medication Sig Start Date End Date Taking? Authorizing Provider  albuterol (VENTOLIN HFA) 108 (90 Base) MCG/ACT inhaler Inhale into the lungs. 01/28/21   [provider]  furosemide (LASIX) 40 MG tablet Take 1 tablet (40 mg) in the AM, and 1 tablet (40 mg) in the PM 02/01/21   Troy Sine, MD  losartan (COZAAR) 25 MG tablet Take 1 tablet (25 mg total) by mouth daily. 02/05/21 05/06/21  Troy Sine, MD  metoprolol succinate (TOPROL XL) 25 MG 24 hr tablet Take 0.5 tablet (12.5 mg) for 4 days, then increase to 1 tablet (25 mg) daily 02/01/21   Troy Sine, MD     Allergies:    Allergies  Allergen Reactions   Shellfish-Derived Products Hives    Social History:   Social History   Socioeconomic History   Marital status: Married    Spouse name: Not on file   Number of children: Not on file   Years of education: Not on file   Highest education level: Not on file  Occupational History   Occupation: Banker   Tobacco Use   Smoking status: Former    Packs/day: 1.00    Years: 30.00    Pack years: 30.00    Types: Pipe, Cigarettes   Smokeless tobacco: Never  Vaping Use   Vaping Use: Never used  Substance and Sexual Activity   Alcohol use: No    Alcohol/week: 0.0 standard  drinks   Drug use: No   Sexual activity: Not on file  Other Topics Concern   Not on file  Social History Narrative   Not on file   Social Determinants of Health   Financial Resource Strain: Not on file  Food Insecurity: Not on file  Transportation Needs: Not on file  Physical Activity: Not on file  Stress: Not on file  Social Connections: Not on file  Intimate Partner Violence: Not on file    Family History:   The patient's family history includes Allergies in his mother; Asthma in his mother.    ROS:  Review of Systems: [y] = yes, [ ]  = no      General: Weight gain [place]  ]; Weight loss [ ] ; Anorexia [ ] ; Fatigue [ ] ; Fever [ ] ; Chills [ ] ; Weakness [ ]    Cardiac: Chest pain/pressure [ ] ; Resting SOB [ y]; Exertional SOB [ y]; Orthopnea [ y]; Pedal Edema [ y]; Palpitations [ ] ; Syncope [ ] ; Presyncope [ ] ; Paroxysmal nocturnal dyspnea [ ]    Pulmonary: Cough [ ] ; Wheezing [ ] ; Hemoptysis [ ] ; Sputum [ ] ; Snoring [ ]    GI: Vomiting [ ] ; Dysphagia [ ] ; Melena [ ] ; Hematochezia [ ] ; Heartburn [ ] ; Abdominal pain [ ] ; Constipation [ ] ; Diarrhea [ ] ; BRBPR [ ]    GU: Hematuria [ ] ; Dysuria [ ] ; Nocturia [ ]  Vascular: Pain in legs with walking [ ] ; Pain in feet with lying flat [ ] ; Non-healing sores [ ] ; Stroke [ ] ; TIA [ ] ; Slurred speech [ ] ;   Neuro: Headaches [ ] ; Vertigo [ ] ; Seizures [ ] ; Paresthesias [ ] ;Blurred vision [ ] ; Diplopia [ ] ; Vision changes [ ]    Ortho/Skin: Arthritis [ ] ; Joint pain [ ] ; Muscle pain [ ] ; Joint swelling [ ] ; Back Pain [ ] ; Rash [ ]    Psych: Depression [ ] ; Anxiety [ ]    Heme: Bleeding problems [ ] ; Clotting disorders [ ] ; Anemia [ ]    Endocrine: Diabetes [ ] ; Thyroid dysfunction [ ]    Physical Exam/Data:   Vitals:   02/14/21 0015 02/14/21 0030 02/14/21 0045 02/14/21 0150  BP: 102/74 95/72 98/79  98/68  Pulse: 89 95 94 92  Resp: (!) 29 (!) 22 (!) 30 20  Temp:    98.1 F (36.7 C)  TempSrc:    Oral  SpO2: 95% 98% 98% 99%  Weight:    99.2 kg  Height:    6\' 2"  (1.88 m)    Intake/Output Summary (Last 24 hours) at 02/14/2021 0412 Last data filed at 02/14/2021 0410 Gross per 24 hour  Intake --  Output 450 ml  Net -450 ml   Last 3 Weights 02/14/2021 02/13/2021 02/13/2021  Weight (lbs) 218 lb 9.6 oz 226 lb 4.8 oz 219 lb  Weight (kg) 99.156 kg 102.65 kg 99.338 kg     Body mass index is 28.07 kg/m.  General:  Well nourished, well developed, in no acute distress HEENT: normal Lymph: no adenopathy Neck: JVD 2-3cm above the clavicle at 30 degrees Endocrine:  No thryomegaly Vascular: No carotid bruits; FA pulses 2+ bilaterally without  bruits  Cardiac:  normal S1, S2; RRR; no murmur appreciated Lungs:  clear to auscultation bilaterally, no wheezing, rhonchi or rales  Abd: soft, nontender, no hepatomegaly, distended Ext: 2+ LE edema bilaterally up to the tibial tuberosity Musculoskeletal:  No deformities, BUE and BLE strength normal and equal Skin: warm and dry  Neuro:  CNs  2-12 intact, no focal abnormalities noted Psych:  Normal affect   Telemetry: Reviewed showing sinus rhythm  Relevant CV Studies: None  Laboratory Data:  High Sensitivity Troponin:  No results for input(s): TROPONINIHS in the last 720 hours.    Chemistry Recent Labs  Lab 02/13/21 1709  NA 134*  K 3.5  CL 94*  CO2 30  GLUCOSE 104*  BUN 18  CREATININE 0.93  CALCIUM 9.0  GFRNONAA >60  ANIONGAP 10    No results for input(s): PROT, ALBUMIN, AST, ALT, ALKPHOS, BILITOT in the last 168 hours. Hematology Recent Labs  Lab 02/13/21 1709  WBC 7.9  RBC 4.52  HGB 13.1  HCT 40.4  MCV 89.4  MCH 29.0  MCHC 32.4  RDW 16.0*  PLT 133*   BNP Recent Labs  Lab 02/13/21 1709  BNP 1,831.1*    DDimer No results for input(s): DDIMER in the last 168 hours.  Radiology/Studies:  DG Chest 2 View  Result Date: 02/13/2021 CLINICAL DATA:  CHF EXAM: CHEST - 2 VIEW COMPARISON:  02/06/2021. FINDINGS: Similar versus slightly increased moderate right pleural effusion. Similar overlying right basilar opacities. No visible pneumothorax. Similar cardiomediastinal silhouette. IMPRESSION: 1. Similar versus slightly increased moderate right pleural effusion. 2. Similar overlying right basilar opacities, which could represent atelectasis and/or pneumonia. Electronically Signed   By: Feliberto Harts M.D.   On: 02/13/2021 17:27   ECHOCARDIOGRAM COMPLETE  Result Date: 02/13/2021    ECHOCARDIOGRAM REPORT   Patient Name:   Erik Chan Date of Exam: 02/13/2021 Medical Rec #:  [id number]       Height:       74.0 in Accession #:    [id number]      Weight:       219.4 lb  Date of Birth:  September 22, 1955       BSA:          2.261 m Patient Age:    65 years        BP:           110/60 mmHg Patient Gender: M               HR:           92 bpm. Exam Location:  Outpatient Procedure: 2D Echo, Cardiac Doppler, Color Doppler and Intracardiac            Opacification Agent Indications:    R06.02 SOB; R60.0 Lower extremity edema  History:        Patient has no prior history of Echocardiogram examinations.                 COPD, Signs/Symptoms:Shortness of Breath and Edema; Risk                 Factors:Current Smoker. Patient denies chest pain but has had                 extreme SOB and leg swelling x 3 months.  Sonographer:    Carlos American RVT, RDCS (AE), RDMS Referring Phys: [id number] THOMAS A KELLY IMPRESSIONS  1. Left ventricular ejection fraction, by estimation, is <20%. Left ventricular ejection fraction by 3D volume is 13 %. The left ventricle has severely decreased function. The left ventricle demonstrates global hypokinesis. The left ventricular internal  cavity size was moderately dilated. Left ventricular diastolic parameters are indeterminate. There is no evidence of LV thrombus on contrasted study.  2. Right ventricular systolic function is severely reduced. The right ventricular size is mildly  enlarged. There is normal pulmonary artery systolic pressure. The estimated right ventricular systolic pressure is [id number] mmHg.  3. Left atrial size was severely dilated.  4. Right atrial size was severely dilated.  5. A small pericardial effusion is present. The pericardial effusion is localized near the right atrium.  6. The mitral valve is abnormal. Moderate to severe mitral valve regurgitation. Regurgitation volume 52 mL and ERO 0.51 cm2.  7. The aortic valve is tricuspid. Aortic valve regurgitation is mild. No aortic stenosis is present.  8. The inferior vena cava is dilated in size with <50% respiratory variability, suggesting right atrial pressure of 15 mmHg. Comparison(s): No prior  Echocardiogram. CXR 02/06/2021 showed moderate right pleural effusion with clear left lung. FINDINGS  Left Ventricle: Left ventricular ejection fraction, by estimation, is <20%. Left ventricular ejection fraction by 3D volume is 13 %. The left ventricle has severely decreased function. The left ventricle demonstrates global hypokinesis. Definity contrast agent was given IV to delineate the left ventricular endocardial borders. The left ventricular internal cavity size was moderately dilated. There is no left ventricular hypertrophy. Left ventricular diastolic parameters are indeterminate. Right Ventricle: The right ventricular size is mildly enlarged. No increase in right ventricular wall thickness. Right ventricular systolic function is severely reduced. There is normal pulmonary artery systolic pressure. The tricuspid regurgitant velocity is 2.18 m/s, and with an assumed right atrial pressure of 15 mmHg, the estimated right ventricular systolic pressure is [id number] mmHg. Left Atrium: Left atrial size was severely dilated. Right Atrium: Right atrial size was severely dilated. Pericardium: A small pericardial effusion is present. The pericardial effusion is localized near the right atrium. Mitral Valve: The mitral valve is abnormal. Moderate to severe mitral valve regurgitation. Tricuspid Valve: The tricuspid valve is normal in structure. Tricuspid valve regurgitation is mild . No evidence of tricuspid stenosis. Aortic Valve: The aortic valve is tricuspid. Aortic valve regurgitation is mild. Aortic regurgitation PHT measures 447 msec. No aortic stenosis is present. Aortic valve mean gradient measures 2.0 mmHg. Aortic valve peak gradient measures 4.2 mmHg. Aortic  valve area, by VTI measures 1.71 cm. Pulmonic Valve: The pulmonic valve was grossly normal. Pulmonic valve regurgitation is mild. No evidence of pulmonic stenosis. Aorta: The aortic root, ascending aorta and aortic arch are all structurally normal, with no  evidence of dilitation or obstruction. Venous: The inferior vena cava is dilated in size with less than 50% respiratory variability, suggesting right atrial pressure of 15 mmHg. IAS/Shunts: No atrial level shunt detected by color flow Doppler.  LEFT VENTRICLE PLAX 2D LVIDd:         6.53 cm LVIDs:         6.12 cm LV PW:         0.84 cm         3D Volume EF LV IVS:        0.48 cm         LV 3D EF:    Left LVOT diam:     2.30 cm                      ventricul LV SV:         28                           ar LV SV Index:   12  ejection LVOT Area:     4.15 cm                     fraction                                             by 3D                                             volume is                                             13 %.                                 3D Volume EF:                                3D EF:        13 %                                LV EDV:       196 ml                                LV ESV:       170 ml                                LV SV:        26 ml LEFT ATRIUM              Index        RIGHT ATRIUM           Index LA diam:        4.90 cm  2.17 cm/m   RA Area:     31.50 cm LA Vol (A2C):   155.0 ml 68.56 ml/m  RA Volume:   122.00 ml 53.97 ml/m LA Vol (A4C):   123.0 ml 54.41 ml/m LA Biplane Vol: 145.0 ml 64.14 ml/m  AORTIC VALVE                    PULMONIC VALVE AV Area (Vmax):    1.56 cm     PV Vmax:          0.59 m/s AV Area (Vmean):   1.43 cm     PV Peak grad:     1.4 mmHg AV Area (VTI):     1.71 cm     PR End Diast Vel: 8.41 msec AV Vmax:           103.00 cm/s AV Vmean:          73.200 cm/s AV VTI:            0.162 m AV Peak Grad:  4.2 mmHg AV Mean Grad:      2.0 mmHg LVOT Vmax:         38.60 cm/s LVOT Vmean:        25.200 cm/s LVOT VTI:          0.066 m LVOT/AV VTI ratio: 0.41 AI PHT:            447 msec AR Vena Contracta: 0.29 cm  AORTA Ao Root diam: 3.20 cm Ao Asc diam:  3.50 cm Ao Arch diam: 2.7 cm MITRAL VALVE                  TRICUSPID VALVE  MV Area (PHT): 3.39 cm       TR Peak grad:   19.0 mmHg MV Decel Time: 224 msec       TR Vmax:        218.00 cm/s MR Peak grad:    55.4 mmHg MR Mean grad:    37.0 mmHg    SHUNTS MR Vmax:         372.00 cm/s  Systemic VTI:  0.07 m MR Vmean:        289.0 cm/s   Systemic Diam: 2.30 cm MR PISA:         7.60 cm MR PISA Eff ROA: 51 mm MR PISA Radius:  1.10 cm MV E velocity: 67.10 cm/s Rudean Haskell MD Electronically signed by Rudean Haskell MD Signature Date/Time: 02/13/2021/4:56:18 PM    Final    {   New York Heart Association (NYHA) Functional Class NYHA Class II  Assessment and Plan:   Acute systolic congestive heart failure NYHA class IV symptoms, LVEF less than 20% Right pleural effusion Ascites, elevated LFT's COPD - recent tobacco cessation  Erik Chan has acute systolic congestive heart failure and is found to have a very low EF less than 20% with NYHA class IV symptoms at rest. He has had a moderate right pleural effusion since at least December 28.  He also had recent ultrasound of the abdomen which showed some ascites. He was short of breath and had difficulty laying flat but is already feeling much better after his first dose of IV lasix. He still has a significant amount of LE edema and still has some ways to go with diuresis. He may benefit from right thoracentesis if his effusion doesn't improve with diuretics alone. He will need additional work-up for the etiology of his heart failure and likely cardiac catheterization. This can be held off till he is well optimized from a volume standpoint. We will continue him on his current doses of BB and ARB while we actively diurese him but should plan to increase both doses in the coming days to weeks  Severity of Illness: The appropriate patient status for this patient is INPATIENT. Inpatient status is judged to be reasonable and necessary in order to provide the required intensity of service to ensure the patient's safety. The  patient's presenting symptoms, physical exam findings, and initial radiographic and laboratory data in the context of their chronic comorbidities is felt to place them at high risk for further clinical deterioration. Furthermore, it is not anticipated that the patient will be medically stable for discharge from the hospital within 2 midnights of admission.   * I certify that at the point of admission it is my clinical judgment that the patient will require inpatient hospital care spanning beyond 2 midnights from the point of admission due to high intensity of service, high risk  for further deterioration and high frequency of surveillance required.*   For questions or updates, please contact [place] Please consult [email or web address] for contact info under     Signed, Margie Billet, MD  02/14/2021 4:12 AM

## 2021-02-14 NOTE — Progress Notes (Signed)
Peripherally Inserted Central Catheter Placement  The IV Nurse has discussed with the patient and/or persons authorized to consent for the patient, the purpose of this procedure and the potential benefits and risks involved with this procedure.  The benefits include less needle sticks, lab draws from the catheter, and the patient may be discharged home with the catheter. Risks include, but not limited to, infection, bleeding, blood clot (thrombus formation), and puncture of an artery; nerve damage and irregular heartbeat and possibility to perform a PICC exchange if needed/ordered by physician.  Alternatives to this procedure were also discussed.  Bard Power PICC patient education guide, fact sheet on infection prevention and patient information card has been provided to patient /or left at bedside.    PICC Placement Documentation  PICC Double Lumen [id number] PICC Right Basilic 40 cm 0 cm (Active)  Indication for Insertion or Continuance of Line Chronic illness with exacerbations (CF, Sickle Cell, etc.) 02/14/21 1500  Exposed Catheter (cm) 0 cm 02/14/21 1500  Site Assessment Clean;Dry;Intact 02/14/21 1500  Lumen #1 Status Flushed;Saline locked;Blood return noted 02/14/21 1500  Lumen #2 Status Flushed;Saline locked;Blood return noted 02/14/21 1500  Dressing Type Transparent;Securing device 02/14/21 1500  Dressing Status Clean;Dry;Intact 02/14/21 1500  Antimicrobial disc in place? Yes 02/14/21 1500  Safety Lock Not Applicable [id number]  Line Care Connections checked and tightened 02/14/21 1500  Dressing Intervention New dressing 02/14/21 1500  Dressing Change Due 02/21/21 02/14/21 1500       Holley Bouche Renee 02/14/2021, 3:12 PM

## 2021-02-15 ENCOUNTER — Telehealth: Payer: Self-pay

## 2021-02-15 DIAGNOSIS — I5021 Acute systolic (congestive) heart failure: Secondary | ICD-10-CM | POA: Diagnosis not present

## 2021-02-15 DIAGNOSIS — I493 Ventricular premature depolarization: Secondary | ICD-10-CM | POA: Diagnosis not present

## 2021-02-15 LAB — BASIC METABOLIC PANEL
Anion gap: 12 (ref 5–15)
BUN: 12 mg/dL (ref 8–23)
CO2: 27 mmol/L (ref 22–32)
Calcium: 8.1 mg/dL — ABNORMAL LOW (ref 8.9–10.3)
Chloride: 95 mmol/L — ABNORMAL LOW (ref 98–111)
Creatinine, Ser: 1.07 mg/dL (ref 0.61–1.24)
GFR, Estimated: >60 mL/min (ref >60)
Glucose, Bld: 148 mg/dL — ABNORMAL HIGH (ref 70–99)
Potassium: 3.8 mmol/L (ref 3.5–5.1)
Sodium: 134 mmol/L — ABNORMAL LOW (ref 135–145)

## 2021-02-15 LAB — COOXEMETRY PANEL
Carboxyhemoglobin: 1 % (ref 0.5–1.5)
Carboxyhemoglobin: 1.2 % (ref 0.5–1.5)
Methemoglobin: 0.7 % (ref 0.0–1.5)
Methemoglobin: 0.9 % (ref 0.0–1.5)
O2 Saturation: 46.5 %
O2 Saturation: 59.6 %
Total hemoglobin: 12.6 g/dL (ref 12.0–16.0)
Total hemoglobin: 12.9 g/dL (ref 12.0–16.0)

## 2021-02-15 LAB — TSH: TSH: 1.632 u[IU]/mL (ref 0.350–4.500)

## 2021-02-15 LAB — MAGNESIUM: Magnesium: 1.9 mg/dL (ref 1.7–2.4)

## 2021-02-15 MED ORDER — DIGOXIN 125 MCG PO TABS
0.1250 mg | ORAL_TABLET | Freq: Every day | ORAL | Status: DC
  Administered 2021-02-15 – 2021-02-26 (×12): 0.125 mg via ORAL
  Filled 2021-02-15 (×12): qty 1

## 2021-02-15 MED ORDER — MAGNESIUM SULFATE IN D5W 1-5 GM/100ML-% IV SOLN
1.0000 g | Freq: Once | INTRAVENOUS | Status: AC
  Administered 2021-02-15: 1 g via INTRAVENOUS
  Filled 2021-02-15: qty 100

## 2021-02-15 MED ORDER — SODIUM CHLORIDE 0.9% FLUSH
3.0000 mL | Freq: Two times a day (BID) | INTRAVENOUS | Status: DC
  Administered 2021-02-18 – 2021-02-25 (×6): 3 mL via INTRAVENOUS

## 2021-02-15 NOTE — Telephone Encounter (Signed)
Patient currently hospitalized and needs appointment moved out. Left detailed message on patient main number of details.

## 2021-02-15 NOTE — Progress Notes (Addendum)
Advanced Heart Failure Rounding Note  PCP-Cardiologist: Dr Tresa Endo   Subjective:    Admitted for a/c CHF. Echo EF < 20% , RV severely reduced. LA/RA severely dilated. Frequent PVCs on tele.   PICC placed. Initial Co-ox 52%.  Now on milrinone 0.125 + amio gtt for PVC suppression. Co-ox 60% today   ? If I/Os accurate. Only 425 cc in UOP charted yesterday. Wt down 1 lb. CVP 17  SCr 0.96>>1.07   PVCs suppressed w/ amio ~1-2/min.  K 3.8 Mg 1.9   Overall, feels much better. Breathing much improved. Denies chest pain. Slept better last night. Main compliant is altered taste. Quit smoking 5 wks ago.    Objective:   Weight Range: 98.4 kg Body mass index is 27.86 kg/m.   Vital Signs:   Temp:  [97.5 F (36.4 C)-98.1 F (36.7 C)] 98.1 F (36.7 C) (01/06 0509) Pulse Rate:  [70-96] 91 (01/06 0509) Resp:  [0-40] 20 (01/06 0509) BP: (88-113)/(61-86) 99/61 (01/06 0509) SpO2:  [92 %-96 %] 96 % (01/06 0509) Weight:  [98.4 kg] 98.4 kg (01/06 0509) Last BM Date: 02/14/21  Weight change: Filed Weights   02/13/21 1657 02/14/21 0150 02/15/21 0509  Weight: 102.7 kg 99.2 kg 98.4 kg    Intake/Output:   Intake/Output Summary (Last 24 hours) at 02/15/2021 0715 Last data filed at 02/14/2021 2008 Gross per 24 hour  Intake 609.89 ml  Output 425 ml  Net 184.89 ml      Physical Exam    CVP 17 General:  Well appearing. No respiratory difficulty HEENT: normal Neck: supple. JVD to jaw. Carotids 2+ bilat; no bruits. No lymphadenopathy or thyromegaly appreciated. Cor: PMI nondisplaced. Regular rate & rhythm. No rubs, gallops or murmurs. Lungs: decreased BS at the base bilaterally, no wheezing  Abdomen: soft, nontender, nondistended. No hepatosplenomegaly. No bruits or masses. Good bowel sounds. Extremities: no cyanosis, clubbing, rash, 1+ bilateral LE edema + UNNA boots + RUE PICC  Neuro: alert & oriented x 3, cranial nerves grossly intact. moves all 4 extremities w/o difficulty. Affect  pleasant.   Telemetry   NSR w/ occasional PVC, 90 bpm, ~1-2 PVC/min   EKG    No new EKG to review   Labs    CBC Recent Labs    02/13/21 1709  WBC 7.9  HGB 13.1  HCT 40.4  MCV 89.4  PLT 133*   Basic Metabolic Panel Recent Labs    17/61/60 0921 02/15/21 0505  NA 132* 134*  K 3.0* 3.8  CL 97* 95*  CO2 27 27  GLUCOSE 181* 148*  BUN 13 12  CREATININE 0.96 1.07  CALCIUM 8.0* 8.1*  MG  --  1.9   Liver Function Tests No results for input(s): AST, ALT, ALKPHOS, BILITOT, PROT, ALBUMIN in the last 72 hours. No results for input(s): LIPASE, AMYLASE in the last 72 hours. Cardiac Enzymes No results for input(s): CKTOTAL, CKMB, CKMBINDEX, TROPONINI in the last 72 hours.  BNP: BNP (last 3 results) Recent Labs    02/01/21 1522 02/13/21 1709  BNP 2,411.9* 1,831.1*    ProBNP (last 3 results) No results for input(s): PROBNP in the last 8760 hours.   D-Dimer No results for input(s): DDIMER in the last 72 hours. Hemoglobin A1C No results for input(s): HGBA1C in the last 72 hours. Fasting Lipid Panel No results for input(s): CHOL, HDL, LDLCALC, TRIG, CHOLHDL, LDLDIRECT in the last 72 hours. Thyroid Function Tests Recent Labs    02/15/21 0505  TSH 1.632  Other results:   Imaging    [place] EKG SITE RITE  Result Date: 02/14/2021 If Site Rite image not attached, placement could not be confirmed due to current cardiac rhythm.    Medications:     Scheduled Medications:  Chlorhexidine Gluconate Cloth  6 each Topical Daily   enoxaparin (LOVENOX) injection  40 mg Subcutaneous Q24H   furosemide  80 mg Intravenous BID   pneumococcal 23 valent vaccine  0.5 mL Intramuscular Tomorrow-1000   potassium chloride  20 mEq Oral BID   sodium chloride flush  10-40 mL Intracatheter Q12H   sodium chloride flush  3 mL Intravenous Q12H   spironolactone  12.5 mg Oral Daily    Infusions:  sodium chloride     amiodarone 30 mg/hr (02/15/21 0458)   milrinone 0.125 mcg/kg/min  (02/14/21 2008)    PRN Medications: sodium chloride, acetaminophen, albuterol, ondansetron (ZOFRAN) IV, sodium chloride flush, sodium chloride flush    Patient Profile   Erik Chan is a 66 y.o. male with tobacco abuse and COPD, recently seen by Dr. Tresa Endo for several weeks of shortness of breath.   Assessment/Plan   Acute Biventricular HFrEF>>Low Output  ECHO severely reduced EF < 20% and severely reduced RV.  ? PVC induced. ~ 20% PVC burden   -He has not had an ischemic work up. H/O Tobacco abuse. HS Trop 28>>25. Will need ischemic work up after diuresed. R/LHC on Monday  - Initial Co-ox 51%, started on milrinone 0.125. Co-ox 60% today  - CVP 13. Continue IV Lasix 80 mg bid and supp K  - Spiro 12.5 mg daily  - BP too soft for ARNi/ARB - No ? blocker w/ low output  - Unna boots.    2. PVCs  - High PVC burden 20%.   - now on amio gtt and suppressed, ~1-2/min  - keep K > 4.0 and Mg > 2.0 - needs outpatient sleep study to r/o OSA  3. COPD - Former heavy smoker. Quit smoking 5 week ago  Length of Stay: 1  Brittainy Simmons, PA-C  02/15/2021, 7:15 AM  Advanced Heart Failure Team Pager [phone] (M-F; 7a - 5p)  Please contact [facility] for night-coverage after hours (5p -7a ) and weekends on [email or web address]  Patient seen and examined with the above-signed Advanced Practice Provider and/or Housestaff. I personally reviewed laboratory data, imaging studies and relevant notes. I independently examined the patient and formulated the important aspects of the plan. I have edited the note to reflect any of my changes or salient points. I have personally discussed the plan with the patient and/or family.  Now on milrinone and IV lasix. Diuresing well. Breathing better. Denies orthopnea or PND. Renal function stable. CVP still 17. PVCs improved with mexilitene  General:  Sitting in chair. No resp difficulty HEENT: normal Neck: supple. JVP to ear. Carotids 2+ bilat; no bruits. No  lymphadenopathy or thryomegaly appreciated. Cor: Regular rate & rhythm. No rubs, gallops or murmurs. Lungs: clear Abdomen: soft, nontender, nondistended. No hepatosplenomegaly. No bruits or masses. Good bowel sounds. Extremities: no cyanosis, clubbing, rash, 3+ edema + UNNA Neuro: alert & orientedx3, cranial nerves grossly intact. moves all 4 extremities w/o difficulty. Affect pleasant  Remains on milrinone. Co-ox improved. Still with marked volume overload. Continue diuresis. BP too low to titrate GDMT, Will add digoxin.   PVC burden much improved with amio.   Plan cath Monday. +/- cMRI.   Arvilla Meres, MD  6:56 PM

## 2021-02-15 NOTE — Progress Notes (Signed)
Mobility Specialist Progress Note:   02/15/21 1407  Mobility  Activity Ambulated in hall  Level of Assistance Standby assist, set-up cues, supervision of patient - no hands on  Assistive Device None  Distance Ambulated (ft) 500 ft  Mobility Ambulated with assistance in hallway  Mobility Response Tolerated well  Mobility performed by Mobility specialist  $Mobility charge 1 Mobility   Pt received in bed willing to participate in mobility. No complaints of pain. Pt returned to bed with call bell in reach and all needs met.   [facility] Public librarian Phone [phone] Secondary Phone [phone]

## 2021-02-15 NOTE — TOC Initial Note (Signed)
Transition of Care [facility]) - Initial/Assessment Note    Patient Details  Name: Erik Chan MRN: [id number] Date of Birth: 1955-10-12  [facility]) CM/SW Contact:    Elliot Cousin, RN Phone Number: [phone] 02/15/2021, 5:14 PM  Clinical Narrative:                  HF TOC CM spoke to pt at bedside. States he was driving prior to hospitalization. No HHPT or DME recommended. Will continue to follow for dc needs.     Expected Discharge Plan: Home w Home Health Services Barriers to Discharge: Continued Medical Work up   Patient Goals and CMS Choice Patient states their goals for this hospitalization and ongoing recovery are:: wants to feel better CMS Medicare.gov Compare Post Acute Care list provided to:: Patient    Expected Discharge Plan and Services Expected Discharge Plan: Home w Home Health Services   Discharge Planning Services: CM Consult   Living arrangements for the past 2 months: Apartment   Prior Living Arrangements/Services Living arrangements for the past 2 months: Apartment Lives with:: Spouse Patient language and need for interpreter reviewed:: Yes Do you feel safe going back to the place where you live?: Yes      Need for Family Participation in Patient Care: Yes (Comment) Care giver support system in place?: Yes (comment)   Criminal Activity/Legal Involvement Pertinent to Current Situation/Hospitalization: No - Comment as needed  Activities of Daily Living Home Assistive Devices/Equipment: None ADL Screening (condition at time of admission) Patient's cognitive ability adequate to safely complete daily activities?: Yes Is the patient deaf or have difficulty hearing?: No Does the patient have difficulty seeing, even when wearing glasses/contacts?: No Does the patient have difficulty concentrating, remembering, or making decisions?: No Patient able to express need for assistance with ADLs?: Yes Does the patient have difficulty  dressing or bathing?: No Independently performs ADLs?: Yes (appropriate for developmental age) Does the patient have difficulty walking or climbing stairs?: No Weakness of Legs: Both Weakness of Arms/Hands: None  Permission Sought/Granted Permission sought to share information with : Case Manager, Family Supports, PCP Permission granted to share information with : Yes, Verbal Permission Granted  Share Information with NAME: Jefte Carithers     Permission granted to share info w Relationship: wife  Permission granted to share info w Contact Information: [phone]  Emotional Assessment Appearance:: Appears stated age Attitude/Demeanor/Rapport: Engaged Affect (typically observed): Accepting Orientation: : Oriented to Self, Oriented to Place, Oriented to  Time, Oriented to Situation Alcohol / Substance Use: Not Applicable (prev smoker quit 5 weeks ago) Psych Involvement: No (comment)  Admission diagnosis:  Acute CHF (HCC) [I50.9] Acute systolic congestive heart failure (HCC) [I50.21] Heart failure (HCC) [I50.9] Patient Active Problem List   Diagnosis Date Noted   Heart failure (HCC) 02/14/2021   Acute CHF (HCC) 02/13/2021   Asthmatic bronchitis with acute exacerbation 08/03/2015   Upper airway cough syndrome 08/03/2015   CAP (community acquired pneumonia) 08/03/2015   Cigarette smoker 08/03/2015   PCP:  [name], MD Pharmacy:   [facility] [id number] - [address] [place] - [address] [place] [address] Phone: [phone] Fax: [phone]     Social Determinants of Health (SDOH) Interventions    Readmission Risk Interventions No flowsheet data found.

## 2021-02-15 NOTE — Evaluation (Signed)
Physical Therapy Evaluation & Discharge Patient Details Name: Erik Chan MRN: [id number] DOB: [birth year] Today's Date: 02/15/2021  History of Present Illness  Pt is a 66 y.o. male who presented 02/13/21 with several weeks of SOB. Pt admitted with acute systolic congestive heart failure, R pleural effusion, ascites, and COPD. PMH: CHF, COPD, fibromyalgia   Clinical Impression  Pt presents with condition above. PTA, pt was IND without DME and living with his wife in a 1-level house with 1 STE. He is also the caregiver for his wife and mother. Currently, he is functioning close to his baseline, not displaying any LOB with dynamic gait challenges or need for UE support. Pt does display some mild hip flexor bil weakness and edema in his lower extremities. Educated pt on edema and CHF management. All education completed and questions answered. PT will sign off.       Recommendations for follow up therapy are one component of a multi-disciplinary discharge planning process, led by the attending physician.  Recommendations may be updated based on patient status, additional functional criteria and insurance authorization.  Follow Up Recommendations No PT follow up    Assistance Recommended at Discharge None  Patient can return home with the following   (N/A)    Equipment Recommendations None recommended by PT  Recommendations for Other Services       Functional Status Assessment Patient has not had a recent decline in their functional status     Precautions / Restrictions Precautions Precautions: None Restrictions Weight Bearing Restrictions: No      Mobility  Bed Mobility Overal bed mobility: Modified Independent             General bed mobility comments: Pt able to come to sit R EOB from supine with HOB elevated without assistance.    Transfers Overall transfer level: Modified independent Equipment used: None               General transfer comment: Able to come to  stand safely without LOB from EOB.    Ambulation/Gait Ambulation/Gait assistance: Modified independent (Device/Increase time) Gait Distance (Feet): 175 Feet Assistive device: None Gait Pattern/deviations: WFL(Within Functional Limits) Gait velocity: reduced Gait velocity interpretation: >2.62 ft/sec, indicative of community ambulatory (when cued to increase speed)   General Gait Details: Pt with slightly slow, self-selected gait speed, but able to change speeds, directions, and head positions without LOB. No significant gait deviations.  Stairs            Wheelchair Mobility    Modified Rankin (Stroke Patients Only)       Balance Overall balance assessment: No apparent balance deficits (not formally assessed)                                           Pertinent Vitals/Pain Pain Assessment: Faces Faces Pain Scale: Hurts a little bit Pain Location: legs Pain Descriptors / Indicators: Discomfort Pain Intervention(s): Monitored during session;Limited activity within patient's tolerance;Repositioned;Other (comment) (soft massage for edema management)    Home Living Family/patient expects to be discharged to:: Private residence Living Arrangements: Spouse/significant other Available Help at Discharge: Family;Available 24 hours/day (limited assistance physically available by wife) Type of Home: House Home Access: Stairs to enter Entrance Stairs-Rails: None Entrance Stairs-Number of Steps: 1   Home Layout: One level Home Equipment: BSC/3in1      Prior Function Prior Level of Function : Independent/Modified  Independent;Driving             Mobility Comments: No AD. No recent falls. ADLs Comments: Pt is caregiver for his mother and wife. 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        Extremity/Trunk Assessment   Upper Extremity Assessment Upper Extremity  Assessment: Overall WFL for tasks assessed    Lower Extremity Assessment Lower Extremity Assessment: RLE deficits/detail;LLE deficits/detail RLE Deficits / Details: MMT scores of 4 hip flexion, 5 knee extension; edema noted RLE Coordination: WNL LLE Deficits / Details: MMT scores of 4 hip flexion, 5 knee extension; edema noted LLE Coordination: WNL    Cervical / Trunk Assessment Cervical / Trunk Assessment: Normal  Communication   Communication: No difficulties  Cognition Arousal/Alertness: Awake/alert Behavior During Therapy: WFL for tasks assessed/performed Overall Cognitive Status: Within Functional Limits for tasks assessed                                          General Comments General comments (skin integrity, edema, etc.): VSS on RA; educated pt to reduce sodium and processed foods intake, which he already does, and weigh self daily along with regular, frequent activity    Exercises Other Exercises Other Exercises: performed gentle massage to feet and lower legs bil to reduce edema; educated to elevate and perform ankle pumps for edema management   Assessment/Plan    PT Assessment Patient does not need any further PT services  PT Problem List         PT Treatment Interventions      PT Goals (Current goals can be found in the Care Plan section)  Acute Rehab PT Goals Patient Stated Goal: to go home PT Goal Formulation: All assessment and education complete, DC therapy Time For Goal Achievement: 02/16/21 Potential to Achieve Goals: Good    Frequency       Co-evaluation               AM-PAC PT "6 Clicks" Mobility  Outcome Measure Help needed turning from your back to your side while in a flat bed without using bedrails?: None Help needed moving from lying on your back to sitting on the side of a flat bed without using bedrails?: None Help needed moving to and from a bed to a chair (including a wheelchair)?: None Help needed standing  up from a chair using your arms (e.g., wheelchair or bedside chair)?: None Help needed to walk in hospital room?: None Help needed climbing 3-5 steps with a railing? : None 6 Click Score: 24    End of Session   Activity Tolerance: Patient tolerated treatment well Patient left: in chair;with call bell/phone within reach Nurse Communication: Mobility status PT Visit Diagnosis: Muscle weakness (generalized) (M62.81)    Time: [id number] PT Time Calculation (min) (ACUTE ONLY): 40 min   Charges:   PT Evaluation $PT Eval Low Complexity: 1 Low PT Treatments $Gait Training: 8-22 mins $Therapeutic Activity: 8-22 mins        Moishe Spice, PT, DPT Acute Rehabilitation Services  Pager: [phone] Office: [place] 02/15/2021, 9:20 AM

## 2021-02-16 DIAGNOSIS — I493 Ventricular premature depolarization: Secondary | ICD-10-CM | POA: Diagnosis not present

## 2021-02-16 DIAGNOSIS — I5021 Acute systolic (congestive) heart failure: Secondary | ICD-10-CM | POA: Diagnosis not present

## 2021-02-16 LAB — BASIC METABOLIC PANEL
Anion gap: 8 (ref 5–15)
BUN: 11 mg/dL (ref 8–23)
CO2: 26 mmol/L (ref 22–32)
Calcium: 8.3 mg/dL — ABNORMAL LOW (ref 8.9–10.3)
Chloride: 94 mmol/L — ABNORMAL LOW (ref 98–111)
Creatinine, Ser: 1.17 mg/dL (ref 0.61–1.24)
GFR, Estimated: >60 mL/min (ref >60)
Glucose, Bld: 122 mg/dL — ABNORMAL HIGH (ref 70–99)
Potassium: 3.6 mmol/L (ref 3.5–5.1)
Sodium: 128 mmol/L — ABNORMAL LOW (ref 135–145)

## 2021-02-16 LAB — COOXEMETRY PANEL
Carboxyhemoglobin: 1.1 % (ref 0.5–1.5)
Methemoglobin: 1 % (ref 0.0–1.5)
O2 Saturation: 52.2 %
Total hemoglobin: 12.4 g/dL (ref 12.0–16.0)

## 2021-02-16 LAB — MAGNESIUM: Magnesium: 2.1 mg/dL (ref 1.7–2.4)

## 2021-02-16 MED ORDER — POTASSIUM CHLORIDE CRYS ER 20 MEQ PO TBCR
40.0000 meq | EXTENDED_RELEASE_TABLET | Freq: Once | ORAL | Status: AC
Start: 2021-02-16 — End: 2021-02-16
  Administered 2021-02-16: 40 meq via ORAL
  Filled 2021-02-16: qty 2

## 2021-02-16 MED ORDER — FUROSEMIDE 10 MG/ML IJ SOLN
15.0000 mg/h | INTRAVENOUS | Status: DC
  Administered 2021-02-16 – 2021-02-19 (×6): 15 mg/h via INTRAVENOUS
  Filled 2021-02-16 (×10): qty 20

## 2021-02-16 MED ORDER — MILRINONE LACTATE IN DEXTROSE 20-5 MG/100ML-% IV SOLN: 0.2500 ug/kg/min | INTRAVENOUS | Status: DC

## 2021-02-16 NOTE — Progress Notes (Signed)
Mobility Specialist Progress Note:   02/16/21 1029  Mobility  Activity Ambulated in hall  Level of Assistance Standby assist, set-up cues, supervision of patient - no hands on  Assistive Device None  Distance Ambulated (ft) 500 ft  Mobility Ambulated with assistance in hallway  Mobility Response Tolerated well  Mobility performed by Mobility specialist  $Mobility charge 1 Mobility   Pt received EOB willing to participate in mobility. No complaints of pain. Pt returned to bed with call bell in reach and all needs met.   [facility] Public librarian Phone [phone] Secondary Phone [phone]

## 2021-02-16 NOTE — Progress Notes (Signed)
Advanced Heart Failure Rounding Note  PCP-Cardiologist: Dr Tresa Endo   Subjective:    Admitted for a/c CHF. Echo EF < 20% , RV severely reduced. LA/RA severely dilated. Frequent PVCs on tele.   PICC placed. Initial Co-ox 52%.  Now on milrinone 0.125 + amio gtt for PVC suppression. Co-ox 52% today. On lasix 80 IV bid. Weight down only one pound. SBP 100-110 CVP remains 17-18   On IV amio for PVC suppression   Says he is breathing better. Denies orthopnea or PND.     Objective:   Weight Range: 98.2 kg Body mass index is 27.78 kg/m.   Vital Signs:   Temp:  [97.6 F (36.4 C)-97.7 F (36.5 C)] 97.7 F (36.5 C) (01/07 1128) Pulse Rate:  [83-90] 85 (01/07 1128) Resp:  [18-20] 20 (01/07 1128) BP: (98-107)/(69-75) 103/69 (01/07 1128) SpO2:  [94 %-96 %] 94 % (01/07 1128) Weight:  [98.2 kg] 98.2 kg (01/07 0316) Last BM Date: 02/15/21  Weight change: Filed Weights   02/14/21 0150 02/15/21 0509 02/16/21 0316  Weight: 99.2 kg 98.4 kg 98.2 kg    Intake/Output:   Intake/Output Summary (Last 24 hours) at 02/16/2021 1603 Last data filed at 02/16/2021 1400 Gross per 24 hour  Intake 1033.07 ml  Output 1450 ml  Net -416.93 ml       Physical Exam    General: Sitting up in bed No resp difficulty HEENT: normal Neck: supple. JVP to ear. Carotids 2+ bilat; no bruits. No lymphadenopathy or thryomegaly appreciated. Cor: PMI laterally displaced. Regular rate & rhythm. No rubs, gallops or murmurs. Lungs: clear Abdomen: soft, nontender, nondistended. No hepatosplenomegaly. No bruits or masses. Good bowel sounds. Extremities: no cyanosis, clubbing, rash, 3+ edema + UNNA Neuro: alert & orientedx3, cranial nerves grossly intact. moves all 4 extremities w/o difficulty. Affect pleasant   Telemetry   NSR w/ occasional PVCs 80-90s bpm, ~5-10 PVC/min    Labs    CBC Recent Labs    02/13/21 1709  WBC 7.9  HGB 13.1  HCT 40.4  MCV 89.4  PLT 133*    Basic Metabolic Panel Recent  Labs    02/15/21 0505 02/16/21 0545  NA 134* 128*  K 3.8 3.6  CL 95* 94*  CO2 27 26  GLUCOSE 148* 122*  BUN 12 11  CREATININE 1.07 1.17  CALCIUM 8.1* 8.3*  MG 1.9 2.1    Liver Function Tests No results for input(s): AST, ALT, ALKPHOS, BILITOT, PROT, ALBUMIN in the last 72 hours. No results for input(s): LIPASE, AMYLASE in the last 72 hours. Cardiac Enzymes No results for input(s): CKTOTAL, CKMB, CKMBINDEX, TROPONINI in the last 72 hours.  BNP: BNP (last 3 results) Recent Labs    02/01/21 1522 02/13/21 1709  BNP 2,411.9* 1,831.1*     ProBNP (last 3 results) No results for input(s): PROBNP in the last 8760 hours.   D-Dimer No results for input(s): DDIMER in the last 72 hours. Hemoglobin A1C No results for input(s): HGBA1C in the last 72 hours. Fasting Lipid Panel No results for input(s): CHOL, HDL, LDLCALC, TRIG, CHOLHDL, LDLDIRECT in the last 72 hours. Thyroid Function Tests Recent Labs    02/15/21 0505  TSH 1.632     Other results:   Imaging    No results found.   Medications:     Scheduled Medications:  Chlorhexidine Gluconate Cloth  6 each Topical Daily   digoxin  0.125 mg Oral Daily   enoxaparin (LOVENOX) injection  40 mg Subcutaneous Q24H  furosemide  80 mg Intravenous BID   pneumococcal 23 valent vaccine  0.5 mL Intramuscular Tomorrow-1000   potassium chloride  20 mEq Oral BID   sodium chloride flush  10-40 mL Intracatheter Q12H   sodium chloride flush  3 mL Intravenous Q12H   sodium chloride flush  3 mL Intravenous Q12H   spironolactone  12.5 mg Oral Daily    Infusions:  sodium chloride     amiodarone 30 mg/hr (02/16/21 1048)   milrinone 0.125 mcg/kg/min (02/16/21 0643)    PRN Medications: sodium chloride, acetaminophen, albuterol, ondansetron (ZOFRAN) IV, sodium chloride flush, sodium chloride flush    Patient Profile   Erik Chan is a 66 y.o. male with tobacco abuse and COPD, recently seen by Dr. Tresa Endo for several  weeks of shortness of breath.   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  ? PVC induced. ~ 20% PVC burden   -He has not had an ischemic work up. H/O Tobacco abuse. HS Trop 28>>25. Will need ischemic work up after diuresed. R/LHC on Monday if fully diuresed - Initial Co-ox 51%, started on milrinone 0.125. Co-ox 52% today  - He remains low output will poor response to IV lasix. - Increase milrinone to 0.25 - Switch to lasix gtt at 15  - Spiro 12.5 mg daily  - Continue digoxin 0.125 - Supp K - BP too soft for ARNi/ARB - No ? blocker w/ low output  - Unna boots.  - If not responding to changes above will need to move to ICU for NE support   2. PVCs  - High PVC burden 20%.   - now on amio gtt and partially suppressed - keep K > 4.0 and Mg > 2.0 - needs outpatient sleep study to r/o OSA  3. COPD - Former heavy smoker. Quit smoking 5 week ago  Length of Stay: 2  Arvilla Meres, MD  02/16/2021, 4:03 PM  Advanced Heart Failure Team Pager [phone] (M-F; 7a - 5p)  Please contact [facility] for night-coverage after hours (5p -7a ) and weekends on [email or web address]

## 2021-02-17 DIAGNOSIS — I5021 Acute systolic (congestive) heart failure: Secondary | ICD-10-CM | POA: Diagnosis not present

## 2021-02-17 DIAGNOSIS — I493 Ventricular premature depolarization: Secondary | ICD-10-CM | POA: Diagnosis not present

## 2021-02-17 LAB — BASIC METABOLIC PANEL
Anion gap: 10 (ref 5–15)
BUN: 9 mg/dL (ref 8–23)
CO2: 28 mmol/L (ref 22–32)
Calcium: 8.2 mg/dL — ABNORMAL LOW (ref 8.9–10.3)
Chloride: 93 mmol/L — ABNORMAL LOW (ref 98–111)
Creatinine, Ser: 1.22 mg/dL (ref 0.61–1.24)
GFR, Estimated: >60 mL/min (ref >60)
Glucose, Bld: 118 mg/dL — ABNORMAL HIGH (ref 70–99)
Potassium: 3.4 mmol/L — ABNORMAL LOW (ref 3.5–5.1)
Sodium: 131 mmol/L — ABNORMAL LOW (ref 135–145)

## 2021-02-17 LAB — COOXEMETRY PANEL
Carboxyhemoglobin: 1.1 % (ref 0.5–1.5)
Methemoglobin: 0.9 % (ref 0.0–1.5)
O2 Saturation: 56 %
Total hemoglobin: 12 g/dL (ref 12.0–16.0)

## 2021-02-17 LAB — MAGNESIUM: Magnesium: 1.8 mg/dL (ref 1.7–2.4)

## 2021-02-17 MED ORDER — POTASSIUM CHLORIDE CRYS ER 20 MEQ PO TBCR
60.0000 meq | EXTENDED_RELEASE_TABLET | Freq: Once | ORAL | Status: AC
  Administered 2021-02-17: 60 meq via ORAL
  Filled 2021-02-17: qty 3

## 2021-02-17 MED ORDER — MAGNESIUM SULFATE 2 GM/50ML IV SOLN
2.0000 g | Freq: Once | INTRAVENOUS | Status: AC
  Administered 2021-02-17: 2 g via INTRAVENOUS
  Filled 2021-02-17: qty 50

## 2021-02-17 NOTE — Progress Notes (Signed)
Advanced Heart Failure Rounding Note  PCP-Cardiologist: Dr Claiborne Billings   Subjective:    Admitted for a/c CHF. Echo EF < 20% , RV severely reduced. LA/RA severely dilated. Frequent PVCs on tele.   PICC placed. Initial Co-ox 52%.  Milrinone increased to 0.25 and lasix gtt started yesterday due to poor diuresis.  Diuresis much improved. Weight down 3 pounds. Co-ox 56% K 3.4. he is feeling much better. CVP still 17  19-beat run NSVT   Objective:   Weight Range: 97 kg Body mass index is 27.46 kg/m.   Vital Signs:   Temp:  [97.7 F (36.5 C)-98.1 F (36.7 C)] 98.1 F (36.7 C) (01/08 0813) Pulse Rate:  [82-92] 92 (01/08 0906) Resp:  [17-20] 20 (01/08 0813) BP: (96-108)/(62-77) 104/62 (01/08 0813) SpO2:  [92 %-94 %] 92 % (01/08 0813) Weight:  [97 kg] 97 kg (01/08 0027) Last BM Date: 02/15/21  Weight change: Filed Weights   02/15/21 0509 02/16/21 0316 02/17/21 0027  Weight: 98.4 kg 98.2 kg 97 kg    Intake/Output:   Intake/Output Summary (Last 24 hours) at 02/17/2021 0907 Last data filed at 02/17/2021 0906 Gross per 24 hour  Intake 1310.96 ml  Output 2900 ml  Net -1589.04 ml       Physical Exam    General:  Sitting up in bed No resp difficulty HEENT: normal Neck: supple. JVP to ear Carotids 2+ bilat; no bruits. No lymphadenopathy or thryomegaly appreciated. Cor: PMI laterally displaced. Regular rate & rhythm. No rubs, gallops or murmurs. Lungs: clear Abdomen: soft, nontender, nondistended. No hepatosplenomegaly. No bruits or masses. Good bowel sounds. Extremities: no cyanosis, clubbing, rash, 2+ edema + UNNA Neuro: alert & orientedx3, cranial nerves grossly intact. moves all 4 extremities w/o difficulty. Affect pleasant   Telemetry   NSR w/ occasional PVCs 80-90s bpm, ~ 5-10 PVCs/min Personally reviewed    Labs    CBC No results for input(s): WBC, NEUTROABS, HGB, HCT, MCV, PLT in the last 72 hours.  Basic Metabolic Panel Recent Labs    02/15/21 0505  02/16/21 0545 02/17/21 0354  NA 134* 128* 131*  K 3.8 3.6 3.4*  CL 95* 94* 93*  CO2 27 26 28   GLUCOSE 148* 122* 118*  BUN 12 11 9   CREATININE 1.07 1.17 1.22  CALCIUM 8.1* 8.3* 8.2*  MG 1.9 2.1  --     Liver Function Tests No results for input(s): AST, ALT, ALKPHOS, BILITOT, PROT, ALBUMIN in the last 72 hours. No results for input(s): LIPASE, AMYLASE in the last 72 hours. Cardiac Enzymes No results for input(s): CKTOTAL, CKMB, CKMBINDEX, TROPONINI in the last 72 hours.  BNP: BNP (last 3 results) Recent Labs    02/01/21 1522 02/13/21 1709  BNP 2,411.9* 1,831.1*     ProBNP (last 3 results) No results for input(s): PROBNP in the last 8760 hours.   D-Dimer No results for input(s): DDIMER in the last 72 hours. Hemoglobin A1C No results for input(s): HGBA1C in the last 72 hours. Fasting Lipid Panel No results for input(s): CHOL, HDL, LDLCALC, TRIG, CHOLHDL, LDLDIRECT in the last 72 hours. Thyroid Function Tests Recent Labs    02/15/21 0505  TSH 1.632     Other results:   Imaging    No results found.   Medications:     Scheduled Medications:  Chlorhexidine Gluconate Cloth  6 each Topical Daily   digoxin  0.125 mg Oral Daily   enoxaparin (LOVENOX) injection  40 mg Subcutaneous Q24H   pneumococcal 23  valent vaccine  0.5 mL Intramuscular Tomorrow-1000   potassium chloride  20 mEq Oral BID   sodium chloride flush  3 mL Intravenous Q12H   spironolactone  12.5 mg Oral Daily    Infusions:  amiodarone 30 mg/hr (02/17/21 0350)   furosemide (LASIX) 200 mg in dextrose 5% 100 mL (2mg /mL) infusion 15 mg/hr (02/17/21 0350)   milrinone 0.25 mcg/kg/min (02/17/21 0902)    PRN Medications: acetaminophen, albuterol, ondansetron (ZOFRAN) IV    Patient Profile   Erik Chan is a 66 y.o. male with tobacco abuse and COPD, recently seen by Dr. Claiborne Billings for several weeks of shortness of breath.   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  ? PVC induced. ~ 20% PVC burden   -He has not had an ischemic work up. H/O Tobacco abuse. HS Trop 28>>25. Will need ischemic work up after diuresed.  - Initial Co-ox 51%, started on milrinone 0.125.  - Output and diuresis improved. Co-ox 56% - Continue milrinone 0.25 - Continue lasix gtt at 15  - Continue spiro 12.5 mg daily  - Continue digoxin 0.125 - Supp K - BP too soft for ARNi/ARB - No ? blocker w/ low output  - Continue Unna boots.  - If not responding to changes above will need to move to ICU for NE support - [facility] scheduled for tomorrow but may have to wait until fully diuresed.    2. PVCs/NSVT - High PVC burden 20%.   - 19 beat NSVT - now on amio gtt and partially suppressed - keep K > 4.0 and Mg > 2.0 - needs outpatient sleep study to r/o OSA  3. COPD - Former heavy smoker. Quit smoking 5 week sago  Length of Stay: 3  Erik Bickers, MD  02/17/2021, 9:07 AM  Advanced Heart Failure Team Pager [phone] (M-F; 7a - 5p)  Please contact [facility] for night-coverage after hours (5p -7a ) and weekends on [email or web address]

## 2021-02-17 NOTE — Progress Notes (Signed)
Pt had 19 beats of V tach. Pt asymptomatic, resting, and had just returned to bed from using urinal. Pt continues on Amio drip. MD paged.

## 2021-02-18 DIAGNOSIS — I5021 Acute systolic (congestive) heart failure: Secondary | ICD-10-CM | POA: Diagnosis not present

## 2021-02-18 LAB — CBC
HCT: 34.7 % — ABNORMAL LOW (ref 39.0–52.0)
Hemoglobin: 11.6 g/dL — ABNORMAL LOW (ref 13.0–17.0)
MCH: 30 pg (ref 26.0–34.0)
MCHC: 33.4 g/dL (ref 30.0–36.0)
MCV: 89.7 fL (ref 80.0–100.0)
Platelets: 125 10*3/uL — ABNORMAL LOW (ref 150–400)
RBC: 3.87 MIL/uL — ABNORMAL LOW (ref 4.22–5.81)
RDW: 16.2 % — ABNORMAL HIGH (ref 11.5–15.5)
WBC: 7.1 10*3/uL (ref 4.0–10.5)
nRBC: 0 % (ref 0.0–0.2)

## 2021-02-18 LAB — BASIC METABOLIC PANEL
Anion gap: 10 (ref 5–15)
BUN: 6 mg/dL — ABNORMAL LOW (ref 8–23)
CO2: 30 mmol/L (ref 22–32)
Calcium: 8.2 mg/dL — ABNORMAL LOW (ref 8.9–10.3)
Chloride: 89 mmol/L — ABNORMAL LOW (ref 98–111)
Creatinine, Ser: 1.1 mg/dL (ref 0.61–1.24)
GFR, Estimated: >60 mL/min (ref >60)
Glucose, Bld: 240 mg/dL — ABNORMAL HIGH (ref 70–99)
Potassium: 3.1 mmol/L — ABNORMAL LOW (ref 3.5–5.1)
Sodium: 129 mmol/L — ABNORMAL LOW (ref 135–145)

## 2021-02-18 LAB — GLUCOSE, CAPILLARY
Glucose-Capillary: 130 mg/dL — ABNORMAL HIGH (ref 70–99)
Glucose-Capillary: 103 mg/dL — ABNORMAL HIGH (ref 70–99)
Glucose-Capillary: 162 mg/dL — ABNORMAL HIGH (ref 70–99)

## 2021-02-18 LAB — COOXEMETRY PANEL
Carboxyhemoglobin: 1.4 % (ref 0.5–1.5)
Methemoglobin: 0.8 % (ref 0.0–1.5)
O2 Saturation: 56.5 %
Total hemoglobin: 12 g/dL (ref 12.0–16.0)

## 2021-02-18 LAB — MAGNESIUM: Magnesium: 2.1 mg/dL (ref 1.7–2.4)

## 2021-02-18 MED ORDER — SODIUM CHLORIDE 0.9 % IV SOLN: INTRAVENOUS | Status: DC

## 2021-02-18 MED ORDER — ASPIRIN 81 MG PO CHEW: 81.0000 mg | CHEWABLE_TABLET | ORAL | Status: AC

## 2021-02-18 MED ORDER — POTASSIUM CHLORIDE CRYS ER 20 MEQ PO TBCR
40.0000 meq | EXTENDED_RELEASE_TABLET | Freq: Two times a day (BID) | ORAL | Status: DC
  Administered 2021-02-18 (×2): 40 meq via ORAL
  Filled 2021-02-18 (×3): qty 2

## 2021-02-18 MED ORDER — SODIUM CHLORIDE 0.9% FLUSH: 3.0000 mL | INTRAVENOUS | Status: DC | PRN

## 2021-02-18 MED ORDER — SODIUM CHLORIDE 0.9 % IV SOLN: 250.0000 mL | INTRAVENOUS | Status: DC | PRN

## 2021-02-18 MED ORDER — SPIRONOLACTONE 25 MG PO TABS
25.0000 mg | ORAL_TABLET | Freq: Every day | ORAL | Status: DC
  Administered 2021-02-18 – 2021-02-26 (×9): 25 mg via ORAL
  Filled 2021-02-18 (×9): qty 1

## 2021-02-18 NOTE — Care Management Important Message (Signed)
Important Message  Patient Details  Name: Erik Chan MRN: [id number] Date of Birth: July 16, 1955   Medicare Important Message Given:  Yes     Renie Ora 02/18/2021, 9:02 AM

## 2021-02-18 NOTE — Progress Notes (Signed)
Orthopedic Tech Progress Note Patient Details:  Erik Chan [birth year] [id number]  Ortho Devices Type of Ortho Device: Radio broadcast assistant Ortho Device/Splint Location: BLE Ortho Device/Splint Interventions: Ordered, Application, Adjustment   Post Interventions Patient Tolerated: Well Instructions Provided: Care of device  [name] 02/18/2021, 5:45 PM

## 2021-02-18 NOTE — Progress Notes (Signed)
Mobility Specialist Progress Note:   02/18/21 1000  Mobility  Activity Ambulated in hall  Level of Assistance Modified independent, requires aide device or extra time  Assistive Device None  Distance Ambulated (ft) 240 ft  Mobility Ambulated with assistance in hallway  Mobility Response Tolerated well  Mobility performed by Mobility specialist  $Mobility charge 1 Mobility   Pre- Mobility:  88 HR Post Mobility:   96 HR  Pt received in bed willing to participate in mobility. No complaints of pain. Pt returned to bed with call bell in reach and all needs met.   [facility] Public librarian Phone [phone] Secondary Phone [phone]

## 2021-02-18 NOTE — Progress Notes (Addendum)
Advanced Heart Failure Rounding Note  PCP-Cardiologist: Dr Tresa Endo   Subjective:   Admit weight 226---->206 pounds.   Admitted for a/c CHF. Echo EF < 20% , RV severely reduced. LA/RA severely dilated. Frequent PVCs on tele.   PICC placed. Initial Co-ox 52%.  Over the weekend Milrinone increased to 0.25 and lasix gtt started.  CO-OX 56.5%. Improved diuresis. Negative 2.1 liters. Weight down another 7 pounds.   Feels ok. Says he didn't sleep well.    Objective:   Weight Range: 93.6 kg Body mass index is 26.49 kg/m.   Vital Signs:   Temp:  [97.6 F (36.4 C)-98.2 F (36.8 C)] 98.2 F (36.8 C) (01/09 0722) Pulse Rate:  [82-92] 90 (01/09 0722) Resp:  [17-26] 17 (01/09 0722) BP: (95-109)/(61-73) 97/68 (01/09 0722) SpO2:  [90 %-94 %] 90 % (01/09 0722) Weight:  [93.6 kg] 93.6 kg (01/09 0503) Last BM Date: 02/16/21  Weight change: Filed Weights   02/16/21 0316 02/17/21 0027 02/18/21 0503  Weight: 98.2 kg 97 kg 93.6 kg    Intake/Output:   Intake/Output Summary (Last 24 hours) at 02/18/2021 0747 Last data filed at 02/18/2021 0515 Gross per 24 hour  Intake 1581.79 ml  Output 3250 ml  Net -1668.21 ml      Physical Exam   CVP 10 personally checked General:  Well appearing. No resp difficulty HEENT: normal Neck: supple. JVP 9-10 . Carotids 2+ bilat; no bruits. No lymphadenopathy or thryomegaly appreciated. Cor: PMI nondisplaced. Regular rate & rhythm. No rubs, gallops or murmurs. Lungs: clear Abdomen: soft, nontender, nondistended. No hepatosplenomegaly. No bruits or masses. Good bowel sounds. Extremities: no cyanosis, clubbing, rash, R and LLE 1+ edema with unna boots. RUE PICC Neuro: alert & orientedx3, cranial nerves grossly intact. moves all 4 extremities w/o difficulty. Affect pleasant   Telemetry  SR with frequent PVCs 80s and with NSVT.  Labs    CBC No results for input(s): WBC, NEUTROABS, HGB, HCT, MCV, PLT in the last 72 hours.  Basic Metabolic  Panel Recent Labs    02/16/21 0545 02/17/21 0354 02/17/21 0915 02/18/21 0500  NA 128* 131*  --  129*  K 3.6 3.4*  --  3.1*  CL 94* 93*  --  89*  CO2 26 28  --  30  GLUCOSE 122* 118*  --  240*  BUN 11 9  --  6*  CREATININE 1.17 1.22  --  1.10  CALCIUM 8.3* 8.2*  --  8.2*  MG 2.1  --  1.8  --    Liver Function Tests No results for input(s): AST, ALT, ALKPHOS, BILITOT, PROT, ALBUMIN in the last 72 hours. No results for input(s): LIPASE, AMYLASE in the last 72 hours. Cardiac Enzymes No results for input(s): CKTOTAL, CKMB, CKMBINDEX, TROPONINI in the last 72 hours.  BNP: BNP (last 3 results) Recent Labs    02/01/21 1522 02/13/21 1709  BNP 2,411.9* 1,831.1*    ProBNP (last 3 results) No results for input(s): PROBNP in the last 8760 hours.   D-Dimer No results for input(s): DDIMER in the last 72 hours. Hemoglobin A1C No results for input(s): HGBA1C in the last 72 hours. Fasting Lipid Panel No results for input(s): CHOL, HDL, LDLCALC, TRIG, CHOLHDL, LDLDIRECT in the last 72 hours. Thyroid Function Tests No results for input(s): TSH, T4TOTAL, T3FREE, THYROIDAB in the last 72 hours.  Invalid input(s): FREET3  Other results:   Imaging    No results found.   Medications:     Scheduled  Medications:  aspirin  81 mg Oral Pre-Cath   Chlorhexidine Gluconate Cloth  6 each Topical Daily   digoxin  0.125 mg Oral Daily   enoxaparin (LOVENOX) injection  40 mg Subcutaneous Q24H   pneumococcal 23 valent vaccine  0.5 mL Intramuscular Tomorrow-1000   potassium chloride  20 mEq Oral BID   sodium chloride flush  3 mL Intravenous Q12H   spironolactone  12.5 mg Oral Daily    Infusions:  sodium chloride     sodium chloride     amiodarone 30 mg/hr (02/18/21 0500)   furosemide (LASIX) 200 mg in dextrose 5% 100 mL (2mg /mL) infusion 15 mg/hr (02/18/21 0500)   milrinone Stopped (02/18/21 0458)    PRN Medications: sodium chloride, acetaminophen, albuterol, ondansetron  (ZOFRAN) IV, sodium chloride flush    Patient Profile   Erik Chan is a 66 y.o. male with tobacco abuse and COPD, recently seen by Dr. 76 for several weeks of shortness of breath.   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  ? PVC induced. ~ 20% PVC burden   -He has not had an ischemic work up. H/O Tobacco abuse. HS Trop 28>>25. Will need ischemic work up after diuresed.  - Initial Co-ox 51%, started on milrinone 0.125.  - Improved diuresis noted. Renal function stable.  - Continue milrinone 0.25. CO-OX 56.5%  - Continue lasix gtt at 15 . CVP down to 10.  - Increase spiro to 25 mg daily.  - Continue digoxin 0.125 - Start Kdur 40 meq twice a day. Fist dose now.  - BP too soft for ARNi/ARB - No ? blocker w/ low output  - Continue Unna boots.  - If not responding to changes above will need to move to ICU for NE support - [facility] tomorrow.  - Consult cardiac rehab.   2. PVCs/NSVT - High PVC burden 20%.   - 19 beat NSVT - now on amio gtt and partially suppressed - keep K > 4.0 and Mg > 2.0 - Supp K and will check Mag.  - needs outpatient sleep study to r/o OSA  3. COPD - Former heavy smoker. Quit smoking 5 week sago  Consult cardiac rehab.   Length of Stay: 4  [name] Clegg, NP  02/18/2021, 7:47 AM  Advanced Heart Failure Team Pager [phone] (M-F; 7a - 5p)  Please contact [facility] for night-coverage after hours (5p -7a ) and weekends on [email or web address]  Patient seen and examined with the above-signed Advanced Practice Provider and/or Housestaff. I personally reviewed laboratory data, imaging studies and relevant notes. I independently examined the patient and formulated the important aspects of the plan. I have edited the note to reflect any of my changes or salient points. I have personally discussed the plan with the patient and/or family.  Diuresis has picked up with increased milrinone and  lasix gtt. Co-ox still marginal but CVP down. Remains on amio for PVC suppression  General:  Well appearing. No resp difficulty HEENT: normal Neck: supple. no JVD. Carotids 2+ bilat; no bruits. No lymphadenopathy or thryomegaly appreciated. Cor: PMI laterally displaced. Regular rate & rhythm. No rubs, gallops or murmurs. Lungs: clear Abdomen: soft, nontender, nondistended. No hepatosplenomegaly. No bruits or masses. Good bowel sounds. Extremities: no cyanosis, clubbing, rash, 1+ edema + UNNA Neuro: alert & orientedx3, cranial nerves grossly intact. moves all 4 extremities w/o difficulty. Affect pleasant  Remains tenuous but volume status improving. Continue milrinone and IV  lasix. R/L cath tomorrow. Will likely need cMRI as well. Continue amio for PVC suppression.   Arvilla Meres, MD  10:09 AM

## 2021-02-18 NOTE — Progress Notes (Signed)
CARDIAC REHAB PHASE I   PRE:  Rate/Rhythm: 92 SRwith PVCs    BP: sitting 98/76    SaO2: 91 RA  MODE:  Ambulation: 470 ft   POST:  Rate/Rhythm: 105 ST with PACS/PVCs    BP: sitting 97/64     SaO2: 93 RA  Pt ambulated entire hall at slow pace while talking, no rest. SOB after sitting. VSS, SaO2 better after walk. Has HF book. [place], ACSM 02/18/2021 3:32 PM

## 2021-02-18 NOTE — Progress Notes (Signed)
Per orders, pt is to be NPO after MN and R/L cath scheduled for 7:30am. Discussed with patient and attempted to get consent signed. Patient states that Dr. Haroldine Laws came to see him around 1500-1600 and wanted to wait until Tuesday for cath. Per previous RN caring for pt, it was reported that procedure would be on Tues.   Message sent to [facility] in Cath lab to confirm if pt was on the schedule. Per Anderson Malta pt is on schedule and to keep him NPO until she speaks with Dr. Haroldine Laws.   Patient verbalized that he does not want the cath to happen on Monday. He and his family will be more prepared for Tuesday.    I let the patient know that we will wait for Dr. Haroldine Laws and continue not to eat or drink in case things needed to proceed today.

## 2021-02-18 NOTE — Plan of Care (Signed)
Patient currently on Milinone, Lasix, and Amio. Monitoring CVP q4 (15). Patient is able to get to [facility] and chair independently .   Problem: Clinical Measurements: Goal: Ability to maintain clinical measurements within normal limits will improve Outcome: Progressing Goal: Will remain free from infection Outcome: Progressing Goal: Diagnostic test results will improve Outcome: Progressing Goal: Respiratory complications will improve Outcome: Progressing Goal: Cardiovascular complication will be avoided Outcome: Not Progressing   Problem: Health Behavior/Discharge Planning: Goal: Ability to manage health-related needs will improve Outcome: Progressing   Problem: Education: Goal: Knowledge of General Education information will improve Description: Including pain rating scale, medication(s)/side effects and non-pharmacologic comfort measures Outcome: Progressing

## 2021-02-18 NOTE — Progress Notes (Signed)
Team notified concerning Na+129, K+ 3.1 Bhagat returned call, states team will look at labs and evaluate shortly.

## 2021-02-18 NOTE — H&P (View-Only) (Signed)
Advanced Heart Failure Rounding Note  PCP-Cardiologist: Dr Tresa Endo   Subjective:   Admit weight 226---->206 pounds.   Admitted for a/c CHF. Echo EF < 20% , RV severely reduced. LA/RA severely dilated. Frequent PVCs on tele.   PICC placed. Initial Co-ox 52%.  Over the weekend Milrinone increased to 0.25 and lasix gtt started.  CO-OX 56.5%. Improved diuresis. Negative 2.1 liters. Weight down another 7 pounds.   Feels ok. Says he didn't sleep well.    Objective:   Weight Range: 93.6 kg Body mass index is 26.49 kg/m.   Vital Signs:   Temp:  [97.6 F (36.4 C)-98.2 F (36.8 C)] 98.2 F (36.8 C) (01/09 0722) Pulse Rate:  [82-92] 90 (01/09 0722) Resp:  [17-26] 17 (01/09 0722) BP: (95-109)/(61-73) 97/68 (01/09 0722) SpO2:  [90 %-94 %] 90 % (01/09 0722) Weight:  [93.6 kg] 93.6 kg (01/09 0503) Last BM Date: 02/16/21  Weight change: Filed Weights   02/16/21 0316 02/17/21 0027 02/18/21 0503  Weight: 98.2 kg 97 kg 93.6 kg    Intake/Output:   Intake/Output Summary (Last 24 hours) at 02/18/2021 0747 Last data filed at 02/18/2021 0515 Gross per 24 hour  Intake 1581.79 ml  Output 3250 ml  Net -1668.21 ml      Physical Exam   CVP 10 personally checked General:  Well appearing. No resp difficulty HEENT: normal Neck: supple. JVP 9-10 . Carotids 2+ bilat; no bruits. No lymphadenopathy or thryomegaly appreciated. Cor: PMI nondisplaced. Regular rate & rhythm. No rubs, gallops or murmurs. Lungs: clear Abdomen: soft, nontender, nondistended. No hepatosplenomegaly. No bruits or masses. Good bowel sounds. Extremities: no cyanosis, clubbing, rash, R and LLE 1+ edema with unna boots. RUE PICC Neuro: alert & orientedx3, cranial nerves grossly intact. moves all 4 extremities w/o difficulty. Affect pleasant   Telemetry  SR with frequent PVCs 80s and with NSVT.  Labs    CBC No results for input(s): WBC, NEUTROABS, HGB, HCT, MCV, PLT in the last 72 hours.  Basic Metabolic  Panel Recent Labs    02/16/21 0545 02/17/21 0354 02/17/21 0915 02/18/21 0500  NA 128* 131*  --  129*  K 3.6 3.4*  --  3.1*  CL 94* 93*  --  89*  CO2 26 28  --  30  GLUCOSE 122* 118*  --  240*  BUN 11 9  --  6*  CREATININE 1.17 1.22  --  1.10  CALCIUM 8.3* 8.2*  --  8.2*  MG 2.1  --  1.8  --    Liver Function Tests No results for input(s): AST, ALT, ALKPHOS, BILITOT, PROT, ALBUMIN in the last 72 hours. No results for input(s): LIPASE, AMYLASE in the last 72 hours. Cardiac Enzymes No results for input(s): CKTOTAL, CKMB, CKMBINDEX, TROPONINI in the last 72 hours.  BNP: BNP (last 3 results) Recent Labs    02/01/21 1522 02/13/21 1709  BNP 2,411.9* 1,831.1*    ProBNP (last 3 results) No results for input(s): PROBNP in the last 8760 hours.   D-Dimer No results for input(s): DDIMER in the last 72 hours. Hemoglobin A1C No results for input(s): HGBA1C in the last 72 hours. Fasting Lipid Panel No results for input(s): CHOL, HDL, LDLCALC, TRIG, CHOLHDL, LDLDIRECT in the last 72 hours. Thyroid Function Tests No results for input(s): TSH, T4TOTAL, T3FREE, THYROIDAB in the last 72 hours.  Invalid input(s): FREET3  Other results:   Imaging    No results found.   Medications:     Scheduled  Medications:  aspirin  81 mg Oral Pre-Cath   Chlorhexidine Gluconate Cloth  6 each Topical Daily   digoxin  0.125 mg Oral Daily   enoxaparin (LOVENOX) injection  40 mg Subcutaneous Q24H   pneumococcal 23 valent vaccine  0.5 mL Intramuscular Tomorrow-1000   potassium chloride  20 mEq Oral BID   sodium chloride flush  3 mL Intravenous Q12H   spironolactone  12.5 mg Oral Daily    Infusions:  sodium chloride     sodium chloride     amiodarone 30 mg/hr (02/18/21 0500)   furosemide (LASIX) 200 mg in dextrose 5% 100 mL (2mg /mL) infusion 15 mg/hr (02/18/21 0500)   milrinone Stopped (02/18/21 0458)    PRN Medications: sodium chloride, acetaminophen, albuterol, ondansetron  (ZOFRAN) IV, sodium chloride flush    Patient Profile   Karel Turpen is a 66 y.o. male with tobacco abuse and COPD, recently seen by Dr. 76 for several weeks of shortness of breath.   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  ? PVC induced. ~ 20% PVC burden   -He has not had an ischemic work up. H/O Tobacco abuse. HS Trop 28>>25. Will need ischemic work up after diuresed.  - Initial Co-ox 51%, started on milrinone 0.125.  - Improved diuresis noted. Renal function stable.  - Continue milrinone 0.25. CO-OX 56.5%  - Continue lasix gtt at 15 . CVP down to 10.  - Increase spiro to 25 mg daily.  - Continue digoxin 0.125 - Start Kdur 40 meq twice a day. Fist dose now.  - BP too soft for ARNi/ARB - No ? blocker w/ low output  - Continue Unna boots.  - If not responding to changes above will need to move to ICU for NE support - [facility] tomorrow.  - Consult cardiac rehab.   2. PVCs/NSVT - High PVC burden 20%.   - 19 beat NSVT - now on amio gtt and partially suppressed - keep K > 4.0 and Mg > 2.0 - Supp K and will check Mag.  - needs outpatient sleep study to r/o OSA  3. COPD - Former heavy smoker. Quit smoking 5 week sago  Consult cardiac rehab.   Length of Stay: 4  [name] Clegg, NP  02/18/2021, 7:47 AM  Advanced Heart Failure Team Pager [phone] (M-F; 7a - 5p)  Please contact [facility] for night-coverage after hours (5p -7a ) and weekends on [email or web address]  Patient seen and examined with the above-signed Advanced Practice Provider and/or Housestaff. I personally reviewed laboratory data, imaging studies and relevant notes. I independently examined the patient and formulated the important aspects of the plan. I have edited the note to reflect any of my changes or salient points. I have personally discussed the plan with the patient and/or family.  Diuresis has picked up with increased milrinone and  lasix gtt. Co-ox still marginal but CVP down. Remains on amio for PVC suppression  General:  Well appearing. No resp difficulty HEENT: normal Neck: supple. no JVD. Carotids 2+ bilat; no bruits. No lymphadenopathy or thryomegaly appreciated. Cor: PMI laterally displaced. Regular rate & rhythm. No rubs, gallops or murmurs. Lungs: clear Abdomen: soft, nontender, nondistended. No hepatosplenomegaly. No bruits or masses. Good bowel sounds. Extremities: no cyanosis, clubbing, rash, 1+ edema + UNNA Neuro: alert & orientedx3, cranial nerves grossly intact. moves all 4 extremities w/o difficulty. Affect pleasant  Remains tenuous but volume status improving. Continue milrinone and IV  lasix. R/L cath tomorrow. Will likely need cMRI as well. Continue amio for PVC suppression.  ° °Alessander Sikorski, MD  °10:09 AM ° ° ° °

## 2021-02-19 ENCOUNTER — Encounter (HOSPITAL_COMMUNITY): Payer: Self-pay | Admitting: Internal Medicine

## 2021-02-19 ENCOUNTER — Encounter (HOSPITAL_COMMUNITY)
Admission: EM | Disposition: A | Discharge status: Home Health | Payer: Self-pay | Source: Ambulatory Visit | Attending: Internal Medicine

## 2021-02-19 DIAGNOSIS — I493 Ventricular premature depolarization: Secondary | ICD-10-CM | POA: Diagnosis not present

## 2021-02-19 DIAGNOSIS — I2510 Atherosclerotic heart disease of native coronary artery without angina pectoris: Secondary | ICD-10-CM | POA: Diagnosis not present

## 2021-02-19 DIAGNOSIS — I5021 Acute systolic (congestive) heart failure: Secondary | ICD-10-CM | POA: Diagnosis not present

## 2021-02-19 HISTORY — PX: RIGHT/LEFT HEART CATH AND CORONARY ANGIOGRAPHY: CATH118266

## 2021-02-19 LAB — POCT I-STAT EG7
Acid-Base Excess: 7 mmol/L — ABNORMAL HIGH (ref 0.0–2.0)
Bicarbonate: 33 mmol/L — ABNORMAL HIGH (ref 20.0–28.0)
Calcium, Ion: 1.15 mmol/L (ref 1.15–1.40)
HCT: 39 % (ref 39.0–52.0)
Hemoglobin: 13.3 g/dL (ref 13.0–17.0)
O2 Saturation: 67 %
Potassium: 3.8 mmol/L (ref 3.5–5.1)
Sodium: 133 mmol/L — ABNORMAL LOW (ref 135–145)
TCO2: 35 mmol/L — ABNORMAL HIGH (ref 22–32)
pCO2, Ven: 50.6 mmHg (ref 44.0–60.0)
pH, Ven: 7.423 (ref 7.250–7.430)
pO2, Ven: 35 mmHg (ref 32.0–45.0)

## 2021-02-19 LAB — COOXEMETRY PANEL
Carboxyhemoglobin: 1.5 % (ref 0.5–1.5)
Methemoglobin: 0.8 % (ref 0.0–1.5)
O2 Saturation: 55.3 %
Total hemoglobin: 12.2 g/dL (ref 12.0–16.0)

## 2021-02-19 LAB — BASIC METABOLIC PANEL
Anion gap: 10 (ref 5–15)
BUN: 6 mg/dL — ABNORMAL LOW (ref 8–23)
CO2: 30 mmol/L (ref 22–32)
Calcium: 8.3 mg/dL — ABNORMAL LOW (ref 8.9–10.3)
Chloride: 91 mmol/L — ABNORMAL LOW (ref 98–111)
Creatinine, Ser: 1.19 mg/dL (ref 0.61–1.24)
GFR, Estimated: >60 mL/min (ref >60)
Glucose, Bld: 166 mg/dL — ABNORMAL HIGH (ref 70–99)
Potassium: 3.6 mmol/L (ref 3.5–5.1)
Sodium: 131 mmol/L — ABNORMAL LOW (ref 135–145)

## 2021-02-19 LAB — CBC
HCT: 36.6 % — ABNORMAL LOW (ref 39.0–52.0)
Hemoglobin: 12.2 g/dL — ABNORMAL LOW (ref 13.0–17.0)
MCH: 29.5 pg (ref 26.0–34.0)
MCHC: 33.3 g/dL (ref 30.0–36.0)
MCV: 88.4 fL (ref 80.0–100.0)
Platelets: 135 10*3/uL — ABNORMAL LOW (ref 150–400)
RBC: 4.14 MIL/uL — ABNORMAL LOW (ref 4.22–5.81)
RDW: 16.3 % — ABNORMAL HIGH (ref 11.5–15.5)
WBC: 8.1 10*3/uL (ref 4.0–10.5)
nRBC: 0 % (ref 0.0–0.2)

## 2021-02-19 LAB — POCT I-STAT 7, (LYTES, BLD GAS, ICA,H+H)
Acid-Base Excess: 4 mmol/L — ABNORMAL HIGH (ref 0.0–2.0)
Bicarbonate: 28.9 mmol/L — ABNORMAL HIGH (ref 20.0–28.0)
Calcium, Ion: 1.05 mmol/L — ABNORMAL LOW (ref 1.15–1.40)
HCT: 38 % — ABNORMAL LOW (ref 39.0–52.0)
Hemoglobin: 12.9 g/dL — ABNORMAL LOW (ref 13.0–17.0)
O2 Saturation: 97 %
Potassium: 3.6 mmol/L (ref 3.5–5.1)
Sodium: 135 mmol/L (ref 135–145)
TCO2: 30 mmol/L (ref 22–32)
pCO2 arterial: 43.5 mmHg (ref 32.0–48.0)
pH, Arterial: 7.43 (ref 7.350–7.450)
pO2, Arterial: 90 mmHg (ref 83.0–108.0)

## 2021-02-19 LAB — MAGNESIUM: Magnesium: 2.1 mg/dL (ref 1.7–2.4)

## 2021-02-19 SURGERY — RIGHT/LEFT HEART CATH AND CORONARY ANGIOGRAPHY
Anesthesia: LOCAL

## 2021-02-19 MED ORDER — MIDAZOLAM HCL 2 MG/2ML IJ SOLN
INTRAMUSCULAR | Status: DC | PRN
  Administered 2021-02-19: 2 mg via INTRAVENOUS

## 2021-02-19 MED ORDER — HEPARIN (PORCINE) IN NACL 1000-0.9 UT/500ML-% IV SOLN
INTRAVENOUS | Status: DC | PRN
  Administered 2021-02-19 (×2): 500 mL

## 2021-02-19 MED ORDER — MEXILETINE HCL 200 MG PO CAPS
200.0000 mg | ORAL_CAPSULE | Freq: Two times a day (BID) | ORAL | Status: DC
  Administered 2021-02-19 – 2021-02-26 (×14): 200 mg via ORAL
  Filled 2021-02-19 (×17): qty 1

## 2021-02-19 MED ORDER — FUROSEMIDE 10 MG/ML IJ SOLN
15.0000 mg/h | INTRAVENOUS | Status: DC
  Administered 2021-02-19 – 2021-02-21 (×6): 15 mg/h via INTRAVENOUS
  Filled 2021-02-19 (×7): qty 20

## 2021-02-19 MED ORDER — HEPARIN SODIUM (PORCINE) 1000 UNIT/ML IJ SOLN
INTRAMUSCULAR | Status: AC
  Filled 2021-02-19: qty 10

## 2021-02-19 MED ORDER — SODIUM CHLORIDE 0.9 % IV SOLN: 250.0000 mL | INTRAVENOUS | Status: DC | PRN

## 2021-02-19 MED ORDER — ENOXAPARIN SODIUM 40 MG/0.4ML IJ SOSY
40.0000 mg | PREFILLED_SYRINGE | INTRAMUSCULAR | Status: DC
  Administered 2021-02-20 – 2021-02-25 (×6): 40 mg via SUBCUTANEOUS
  Filled 2021-02-19 (×7): qty 0.4

## 2021-02-19 MED ORDER — SODIUM CHLORIDE 0.9% FLUSH: 3.0000 mL | INTRAVENOUS | Status: DC | PRN

## 2021-02-19 MED ORDER — MIDAZOLAM HCL 2 MG/2ML IJ SOLN
INTRAMUSCULAR | Status: AC
  Filled 2021-02-19: qty 2

## 2021-02-19 MED ORDER — HEPARIN SODIUM (PORCINE) 1000 UNIT/ML IJ SOLN
INTRAMUSCULAR | Status: DC | PRN
  Administered 2021-02-19: 4500 [IU] via INTRAVENOUS

## 2021-02-19 MED ORDER — LIDOCAINE HCL (PF) 1 % IJ SOLN
INTRAMUSCULAR | Status: DC | PRN
  Administered 2021-02-19 (×2): 2 mL

## 2021-02-19 MED ORDER — FENTANYL CITRATE (PF) 100 MCG/2ML IJ SOLN
INTRAMUSCULAR | Status: AC
  Filled 2021-02-19: qty 2

## 2021-02-19 MED ORDER — IOHEXOL 350 MG/ML SOLN
INTRAVENOUS | Status: DC | PRN
  Administered 2021-02-19: 30 mL

## 2021-02-19 MED ORDER — ASPIRIN 81 MG PO CHEW
81.0000 mg | CHEWABLE_TABLET | Freq: Once | ORAL | Status: AC
  Administered 2021-02-19: 81 mg via ORAL
  Filled 2021-02-19: qty 1

## 2021-02-19 MED ORDER — VERAPAMIL HCL 2.5 MG/ML IV SOLN
INTRAVENOUS | Status: DC | PRN
  Administered 2021-02-19: 10 mL via INTRA_ARTERIAL

## 2021-02-19 MED ORDER — LIDOCAINE HCL (PF) 1 % IJ SOLN
INTRAMUSCULAR | Status: AC
  Filled 2021-02-19: qty 30

## 2021-02-19 MED ORDER — ACETAMINOPHEN 325 MG PO TABS: 650.0000 mg | ORAL_TABLET | ORAL | Status: DC | PRN

## 2021-02-19 MED ORDER — HEPARIN (PORCINE) IN NACL 1000-0.9 UT/500ML-% IV SOLN
INTRAVENOUS | Status: AC
  Filled 2021-02-19: qty 500

## 2021-02-19 MED ORDER — FENTANYL CITRATE (PF) 100 MCG/2ML IJ SOLN
INTRAMUSCULAR | Status: DC | PRN
  Administered 2021-02-19: 25 ug via INTRAVENOUS

## 2021-02-19 MED ORDER — SODIUM CHLORIDE 0.9% FLUSH
3.0000 mL | Freq: Two times a day (BID) | INTRAVENOUS | Status: DC
  Administered 2021-02-22 – 2021-02-26 (×8): 3 mL via INTRAVENOUS

## 2021-02-19 MED ORDER — VERAPAMIL HCL 2.5 MG/ML IV SOLN
INTRAVENOUS | Status: AC
  Filled 2021-02-19: qty 2

## 2021-02-19 MED ORDER — HYDRALAZINE HCL 20 MG/ML IJ SOLN: 10.0000 mg | INTRAMUSCULAR | Status: AC | PRN

## 2021-02-19 MED ORDER — ONDANSETRON HCL 4 MG/2ML IJ SOLN: 4.0000 mg | Freq: Four times a day (QID) | INTRAMUSCULAR | Status: DC | PRN

## 2021-02-19 MED ORDER — LABETALOL HCL 5 MG/ML IV SOLN: 10.0000 mg | INTRAVENOUS | Status: AC | PRN

## 2021-02-19 MED ORDER — POTASSIUM CHLORIDE CRYS ER 10 MEQ PO TBCR
40.0000 meq | EXTENDED_RELEASE_TABLET | Freq: Two times a day (BID) | ORAL | Status: DC
  Administered 2021-02-19 – 2021-02-20 (×3): 40 meq via ORAL
  Administered 2021-02-20: 20 meq via ORAL
  Filled 2021-02-19 (×5): qty 4

## 2021-02-19 MED ORDER — MEXILETINE HCL 150 MG PO CAPS
150.0000 mg | ORAL_CAPSULE | Freq: Two times a day (BID) | ORAL | Status: DC
  Administered 2021-02-19: 150 mg via ORAL
  Filled 2021-02-19 (×2): qty 1

## 2021-02-19 SURGICAL SUPPLY — 14 items
CATH BALLN WEDGE 5F 110CM (CATHETERS) ×1 IMPLANT
CATH INFINITI 5 FR JL3.5 (CATHETERS) ×1 IMPLANT
CATH INFINITI 5FR MULTPACK ANG (CATHETERS) ×1 IMPLANT
DEVICE RAD COMP TR BAND LRG (VASCULAR PRODUCTS) ×1 IMPLANT
ELECT DEFIB PAD ADLT CADENCE (PAD) ×1 IMPLANT
GLIDESHEATH SLEND SS 6F .021 (SHEATH) ×1 IMPLANT
GUIDEWIRE .025 260CM (WIRE) ×1 IMPLANT
GUIDEWIRE INQWIRE 1.5J.035X260 (WIRE) IMPLANT
INQWIRE 1.5J .035X260CM (WIRE) ×2
KIT MICROPUNCTURE NIT STIFF (SHEATH) ×1 IMPLANT
PACK CARDIAC CATHETERIZATION (CUSTOM PROCEDURE TRAY) ×2 IMPLANT
SHEATH GLIDE SLENDER 4/5FR (SHEATH) ×1 IMPLANT
SHEATH PROBE COVER 6X72 (BAG) ×1 IMPLANT
TRANSDUCER W/STOPCOCK (MISCELLANEOUS) ×2 IMPLANT

## 2021-02-19 NOTE — Progress Notes (Addendum)
Advanced Heart Failure Rounding Note  PCP-Cardiologist: Dr Tresa Endo   Subjective:   Admit weight 226---->200 pounds.   Admitted for a/c CHF. Echo EF < 20% , RV severely reduced. LA/RA severely dilated. Frequent PVCs on tele.   PICC placed. Initial Co-ox 52%.  Remains on milrinone 0.25 and lasix gtt. CO-OX 55%.   Continues on amio drip but with ongoing high PVC burden.   Feels ok. Able to walk down the hall. Denies SOB.   Objective:   Weight Range: 91 kg Body mass index is 25.77 kg/m.   Vital Signs:   Temp:  [97.9 F (36.6 C)-98.4 F (36.9 C)] 98.2 F (36.8 C) (01/10 0716) Pulse Rate:  [85-91] 87 (01/10 0716) Resp:  [17-26] 26 (01/10 0716) BP: (87-123)/(64-88) 87/65 (01/10 0716) SpO2:  [92 %-95 %] 95 % (01/10 0716) Weight:  [91 kg] 91 kg (01/10 0449) Last BM Date: 02/18/21  Weight change: Filed Weights   02/17/21 0027 02/18/21 0503 02/19/21 0449  Weight: 97 kg 93.6 kg 91 kg    Intake/Output:   Intake/Output Summary (Last 24 hours) at 02/19/2021 0949 Last data filed at 02/19/2021 0725 Gross per 24 hour  Intake 1013.58 ml  Output 3125 ml  Net -2111.42 ml      Physical Exam  CVP 10 personally checked.   General:  Well appearing. No resp difficulty HEENT: normal Neck: supple. JVP{ 7-8 . Carotids 2+ bilat; no bruits. No lymphadenopathy or thryomegaly appreciated. Cor: PMI nondisplaced. Regular rate & rhythm. No rubs, gallops or murmurs. Lungs: clear Abdomen: soft, nontender, nondistended. No hepatosplenomegaly. No bruits or masses. Good bowel sounds. Extremities: no cyanosis, clubbing, rash, R and LLE unna boots. RUE pICC  Neuro: alert & orientedx3, cranial nerves grossly intact. moves all 4 extremities w/o difficulty. Affect pleasant    Telemetry  SR with frequent PVCs and NSVT 80s  Labs    CBC Recent Labs    02/18/21 1128 02/19/21 0527  WBC 7.1 8.1  HGB 11.6* 12.2*  HCT 34.7* 36.6*  MCV 89.7 88.4  PLT 125* 135*    Basic Metabolic  Panel Recent Labs    02/18/21 0500 02/19/21 0527  NA 129* 131*  K 3.1* 3.6  CL 89* 91*  CO2 30 30  GLUCOSE 240* 166*  BUN 6* 6*  CREATININE 1.10 1.19  CALCIUM 8.2* 8.3*  MG 2.1 2.1   Liver Function Tests No results for input(s): AST, ALT, ALKPHOS, BILITOT, PROT, ALBUMIN in the last 72 hours. No results for input(s): LIPASE, AMYLASE in the last 72 hours. Cardiac Enzymes No results for input(s): CKTOTAL, CKMB, CKMBINDEX, TROPONINI in the last 72 hours.  BNP: BNP (last 3 results) Recent Labs    02/01/21 1522 02/13/21 1709  BNP 2,411.9* 1,831.1*    ProBNP (last 3 results) No results for input(s): PROBNP in the last 8760 hours.   D-Dimer No results for input(s): DDIMER in the last 72 hours. Hemoglobin A1C No results for input(s): HGBA1C in the last 72 hours. Fasting Lipid Panel No results for input(s): CHOL, HDL, LDLCALC, TRIG, CHOLHDL, LDLDIRECT in the last 72 hours. Thyroid Function Tests No results for input(s): TSH, T4TOTAL, T3FREE, THYROIDAB in the last 72 hours.  Invalid input(s): FREET3  Other results:   Imaging    No results found.   Medications:     Scheduled Medications:  Chlorhexidine Gluconate Cloth  6 each Topical Daily   digoxin  0.125 mg Oral Daily   enoxaparin (LOVENOX) injection  40 mg Subcutaneous Q24H  pneumococcal 23 valent vaccine  0.5 mL Intramuscular Tomorrow-1000   potassium chloride  40 mEq Oral BID   sodium chloride flush  3 mL Intravenous Q12H   spironolactone  25 mg Oral Daily    Infusions:  sodium chloride     sodium chloride 10 mL/hr at 02/19/21 0944   amiodarone 30 mg/hr (02/19/21 0928)   furosemide (LASIX) 200 mg in dextrose 5% 100 mL (2mg /mL) infusion 15 mg/hr (02/19/21 0932)   milrinone 0.25 mcg/kg/min (02/19/21 0929)    PRN Medications: sodium chloride, acetaminophen, albuterol, ondansetron (ZOFRAN) IV, sodium chloride flush    Patient Profile   Erik Chan is a 66 y.o. male with tobacco abuse and  COPD, recently seen by Dr. 76 for several weeks of shortness of breath.   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  ? PVC induced. ~ 20% PVC burden   -He has not had an ischemic work up. H/O Tobacco abuse. HS Trop 28>>25. Will need ischemic work up after diuresed.  - Initial Co-ox 51%, started on milrinone 0.125.  - Improved diuresis noted. Renal function stable.  - Continue milrinone 0.25. CO-OX 55%  - Continue lasix gtt at 15 . CVP 10 today. Will stop lasix drip for cath. Can adjust after cath.   - Continue spiro to 25 mg daily.  - Continue digoxin 0.125 - Continue  Kdur 40 meq twice a day.  - BP too soft for ARNi/ARB - No ? blocker w/ low output  - Continue Unna boots.  - If not responding to changes above will need to move to ICU for NE support - [facility] today  - Cardiac Rehab appreciated.    2. PVCs/NSVT - High PVC burden 20%.   - Continues to have high PVC burden/NSVT. - Continue amio gtt.  Add mexiletine 150 bid.   - keep K > 4.0 and Mg > 2.0 - Supp K  - Mag ok.  - needs outpatient sleep study to r/o OSA - Will need to consider Life Vest   3. COPD - Former heavy smoker. Quit smoking 5 week sago  RHC/LHC today.     Length of Stay: 5  [name] Clegg, NP  02/19/2021, 9:49 AM  Advanced Heart Failure Team Pager [phone] (M-F; 7a - 5p)  Please contact [facility] for night-coverage after hours (5p -7a ) and weekends on [email or web address]  Patient seen and examined with the above-signed Advanced Practice Provider and/or Housestaff. I personally reviewed laboratory data, imaging studies and relevant notes. I independently examined the patient and formulated the important aspects of the plan. I have edited the note to reflect any of my changes or salient points. I have personally discussed the plan with the patient and/or family.  On milrinone and lasix gtt. Co-ox marginal. Continues to diurese well. Denies  CP. Still having frequent PVCs.   General:  Sitting up. No resp difficulty HEENT: normal Neck: supple. no JVD. Carotids 2+ bilat; no bruits. No lymphadenopathy or thryomegaly appreciated. Cor: PMI laterally displaced. Irregular rate & rhythm. No rubs, gallops or murmurs. Lungs: clear Abdomen: soft, nontender, nondistended. No hepatosplenomegaly. No bruits or masses. Good bowel sounds. Extremities: no cyanosis, clubbing, rash, 1+ edema + UNNA Neuro: alert & orientedx3, cranial nerves grossly intact. moves all 4 extremities w/o difficulty. Affect pleasant  Remains very tenuous. Continue milrinone. Agree with adding mexilitene. Plan R/L cath today. Will likely need cMRI and Lifevest.   [phone], MD  11:09 AM

## 2021-02-19 NOTE — Interval H&P Note (Signed)
History and Physical Interval Note:  02/19/2021 8:29 AM  Erik Chan  has presented today for surgery, with the diagnosis of heart failure.  The various methods of treatment have been discussed with the patient and family. After consideration of risks, benefits and other options for treatment, the patient has consented to  Procedure(s): RIGHT/LEFT HEART CATH AND CORONARY ANGIOGRAPHY (N/A) and possible coronary angioplasty as a surgical intervention.  The patient's history has been reviewed, patient examined, no change in status, stable for surgery.  I have reviewed the patient's chart and labs.  Questions were answered to the patient's satisfaction.     Kayo Zion

## 2021-02-19 NOTE — Progress Notes (Signed)
Mobility Specialist Progress Note:   02/19/21 1111  Mobility  Activity Ambulated in room  Level of Assistance Independent after set-up  Assistive Device None  Distance Ambulated (ft) 10 ft  Mobility Ambulated with assistance in room  Mobility Response Tolerated well  Mobility performed by Mobility specialist  $Mobility charge 1 Mobility   Pt received requesting to go to sink to shave before procedure. No complaints of pain. Pt wanting to hold of on further ambulation til after the cath. Will follow-up as time allows.   [facility] Designer, jewellery Phone [phone] Secondary Phone [phone]

## 2021-02-20 ENCOUNTER — Inpatient Hospital Stay (HOSPITAL_COMMUNITY): Payer: Medicare Other

## 2021-02-20 DIAGNOSIS — I5021 Acute systolic (congestive) heart failure: Secondary | ICD-10-CM | POA: Diagnosis not present

## 2021-02-20 DIAGNOSIS — I3139 Other pericardial effusion (noninflammatory): Secondary | ICD-10-CM

## 2021-02-20 DIAGNOSIS — I493 Ventricular premature depolarization: Secondary | ICD-10-CM | POA: Diagnosis not present

## 2021-02-20 LAB — CBC
HCT: 36.7 % — ABNORMAL LOW (ref 39.0–52.0)
Hemoglobin: 12.2 g/dL — ABNORMAL LOW (ref 13.0–17.0)
MCH: 29.5 pg (ref 26.0–34.0)
MCHC: 33.2 g/dL (ref 30.0–36.0)
MCV: 88.9 fL (ref 80.0–100.0)
Platelets: 136 10*3/uL — ABNORMAL LOW (ref 150–400)
RBC: 4.13 MIL/uL — ABNORMAL LOW (ref 4.22–5.81)
RDW: 16.2 % — ABNORMAL HIGH (ref 11.5–15.5)
WBC: 7.8 10*3/uL (ref 4.0–10.5)
nRBC: 0 % (ref 0.0–0.2)

## 2021-02-20 LAB — COOXEMETRY PANEL
Carboxyhemoglobin: 1.5 % (ref 0.5–1.5)
Methemoglobin: 0.6 % (ref 0.0–1.5)
O2 Saturation: 70.7 %
Total hemoglobin: 12.2 g/dL (ref 12.0–16.0)

## 2021-02-20 LAB — BASIC METABOLIC PANEL
Anion gap: 9 (ref 5–15)
BUN: 6 mg/dL — ABNORMAL LOW (ref 8–23)
CO2: 29 mmol/L (ref 22–32)
Calcium: 8.5 mg/dL — ABNORMAL LOW (ref 8.9–10.3)
Chloride: 93 mmol/L — ABNORMAL LOW (ref 98–111)
Creatinine, Ser: 1.27 mg/dL — ABNORMAL HIGH (ref 0.61–1.24)
GFR, Estimated: >60 mL/min (ref >60)
Glucose, Bld: 137 mg/dL — ABNORMAL HIGH (ref 70–99)
Potassium: 4.2 mmol/L (ref 3.5–5.1)
Sodium: 131 mmol/L — ABNORMAL LOW (ref 135–145)

## 2021-02-20 LAB — MAGNESIUM: Magnesium: 2.1 mg/dL (ref 1.7–2.4)

## 2021-02-20 IMAGING — MR MR CARD MORPHOLOGY WO/W CM
45 of 48 series · 45 of 48 positions shown · IV contrast (Contrast agent)
Comparison: none

CLINICAL DATA: 65M with severe heart failure, nonobstructive CAD on
cath

EXAM:
CARDIAC MRI
TECHNIQUE: The patient was scanned on a 1.5 Tesla Siemens magnet. A dedicated
cardiac coil was used. Functional imaging was done using Fiesta
sequences. [DATE], and 4 chamber views were done to assess for RWMA's.
Modified RAMNAUTH rule using a short axis stack was used to
calculate an ejection fraction on a dedicated work station using
Circle software. The patient received 10 cc of Gadavist. After 10
minutes inversion recovery sequences were used to assess for
infiltration and scar tissue.
CONTRAST:  10 cc  of Gadavist

[Series 4: [id number] · axial · 8.0mm · 1.41mm/px · 1 of 16 slices shown]
[im 1/16]
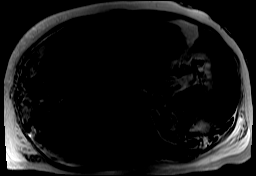

[Series 8: bSSFP · oblique · 8.0mm · 1.61mm/px · 1 of 16 slices shown (1 of 21)]
[im 1/16]
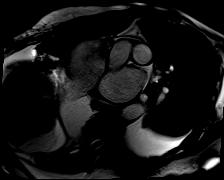

[Series 9: bSSFP · oblique · 8.0mm · 1.61mm/px · 1 of 16 slices shown (2 of 21)]
[im 1/16]
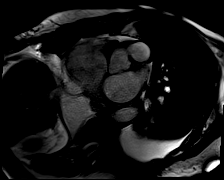

[Series 10: bSSFP · oblique · 8.0mm · 1.61mm/px · 1 of 16 slices shown (3 of 21)]
[im 1/16]
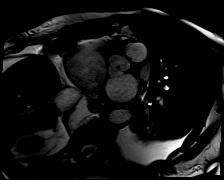

[Series 11: bSSFP · oblique · 8.0mm · 1.61mm/px · 1 of 16 slices shown (4 of 21)]
[im 1/16]
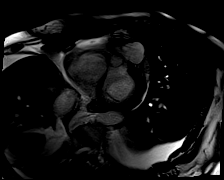

[Series 12: bSSFP · oblique · 8.0mm · 1.61mm/px · 1 of 16 slices shown (5 of 21)]
[im 1/16]
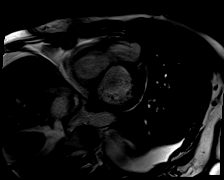

[Series 13: bSSFP · oblique · 8.0mm · 1.61mm/px · 1 of 16 slices shown (6 of 21)]
[im 1/16]
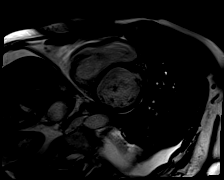

[Series 14: bSSFP · oblique · 8.0mm · 1.61mm/px · 1 of 16 slices shown (7 of 21)]
[im 1/16]
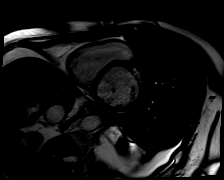

[Series 15: bSSFP · oblique · 8.0mm · 1.61mm/px · 1 of 16 slices shown (8 of 21)]
[im 1/16]
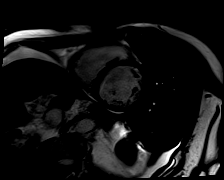

[Series 16: bSSFP · oblique · 8.0mm · 1.61mm/px · 1 of 16 slices shown (9 of 21)]
[im 1/16]
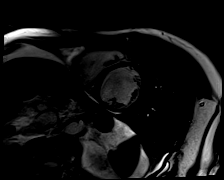

[Series 17: bSSFP · oblique · 8.0mm · 1.61mm/px · 1 of 16 slices shown (10 of 21)]
[im 1/16]
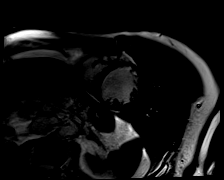

[Series 18: bSSFP · oblique · 8.0mm · 1.61mm/px · 1 of 16 slices shown (11 of 21)]
[im 1/16]
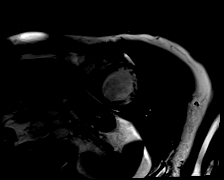

[Series 19: bSSFP · oblique · 8.0mm · 1.61mm/px · 1 of 16 slices shown (12 of 21)]
[im 1/16]
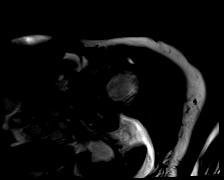

[Series 20: bSSFP · oblique · 8.0mm · 1.61mm/px · 1 of 16 slices shown (13 of 21)]
[im 1/16]
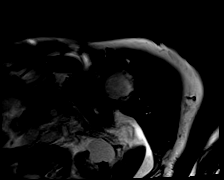

[Series 21: bSSFP · oblique · 8.0mm · 1.61mm/px · 1 of 16 slices shown (14 of 21)]
[im 1/16]
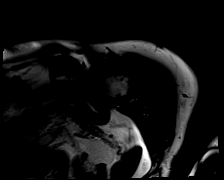

[Series 22: bSSFP · oblique · 8.0mm · 1.61mm/px · 1 of 16 slices shown (15 of 21)]
[im 1/16]
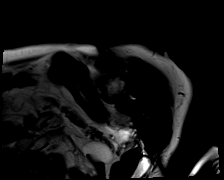

[Series 23: bSSFP · oblique · 8.0mm · 1.61mm/px · 1 of 16 slices shown (16 of 21)]
[im 1/16]
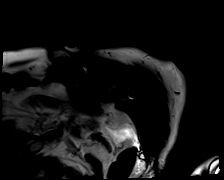

[Series 24: bSSFP · oblique · 8.0mm · 1.61mm/px · 1 of 16 slices shown (17 of 21)]
[im 1/16]
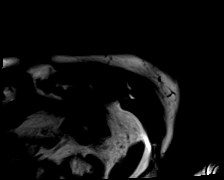

[Series 25: bSSFP · oblique · 8.0mm · 1.61mm/px · 1 of 16 slices shown (18 of 21)]
[im 1/16]
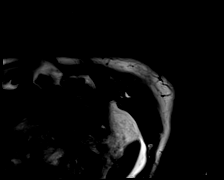

[Series 26: cine_trufi_cs_rt_short axis · oblique · 8.0mm · 1.92mm/px · 1 of 13 slices shown (1 of 18)]
[im 1/13]
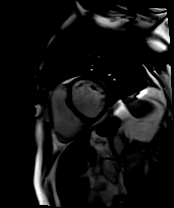

[Series 26: cine_trufi_cs_rt_short axis · oblique · 8.0mm · 1.92mm/px · 1 of 13 slices shown (2 of 18)]
[im 1/13]
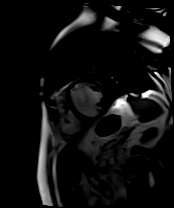

[Series 26: cine_trufi_cs_rt_short axis · oblique · 8.0mm · 1.92mm/px · 1 of 13 slices shown (3 of 18)]
[im 1/13]
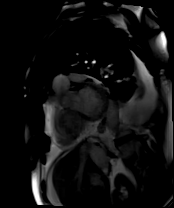

[Series 26: cine_trufi_cs_rt_short axis · oblique · 8.0mm · 1.92mm/px · 1 of 13 slices shown (4 of 18)]
[im 1/13]
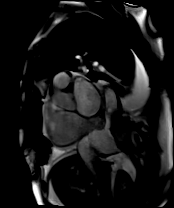

[Series 26: cine_trufi_cs_rt_short axis · oblique · 8.0mm · 1.92mm/px · 1 of 13 slices shown (5 of 18)]
[im 1/13]
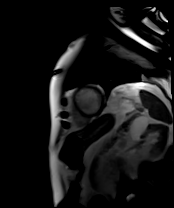

[Series 26: cine_trufi_cs_rt_short axis · oblique · 8.0mm · 1.92mm/px · 1 of 13 slices shown (6 of 18)]
[im 1/13]
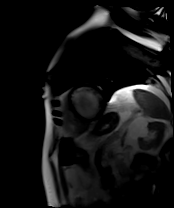

[Series 26: cine_trufi_cs_rt_short axis · oblique · 8.0mm · 1.92mm/px · 1 of 13 slices shown (7 of 18)]
[im 1/13]
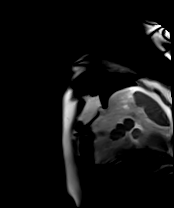

[Series 26: cine_trufi_cs_rt_short axis · oblique · 8.0mm · 1.92mm/px · 1 of 13 slices shown (8 of 18)]
[im 1/13]
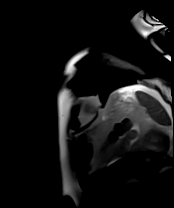

[Series 26: cine_trufi_cs_rt_short axis · oblique · 8.0mm · 1.92mm/px · 1 of 13 slices shown (9 of 18)]
[im 1/13]
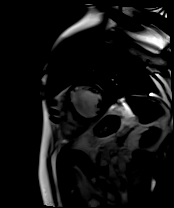

[Series 26: cine_trufi_cs_rt_short axis · oblique · 8.0mm · 1.92mm/px · 1 of 13 slices shown (10 of 18)]
[im 1/13]
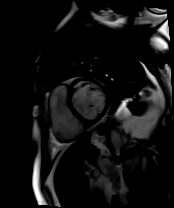

[Series 26: cine_trufi_cs_rt_short axis · oblique · 8.0mm · 1.92mm/px · 1 of 13 slices shown (11 of 18)]
[im 1/13]
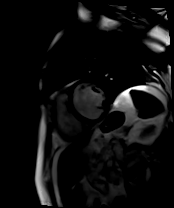

[Series 26: cine_trufi_cs_rt_short axis · oblique · 8.0mm · 1.92mm/px · 1 of 13 slices shown (12 of 18)]
[im 1/13]
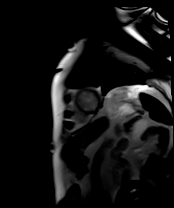

[Series 26: cine_trufi_cs_rt_short axis · oblique · 8.0mm · 1.92mm/px · 1 of 13 slices shown (13 of 18)]
[im 1/13]
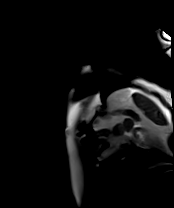

[Series 26: cine_trufi_cs_rt_short axis · oblique · 8.0mm · 1.92mm/px · 1 of 13 slices shown (14 of 18)]
[im 1/13]
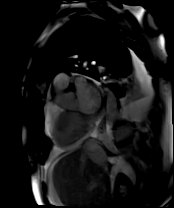

[Series 26: cine_trufi_cs_rt_short axis · oblique · 8.0mm · 1.92mm/px · 1 of 13 slices shown (15 of 18)]
[im 1/13]
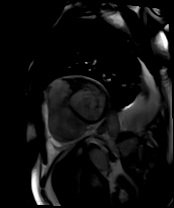

[Series 26: cine_trufi_cs_rt_short axis · oblique · 8.0mm · 1.92mm/px · 1 of 13 slices shown (16 of 18)]
[im 1/13]
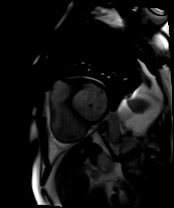

[Series 26: cine_trufi_cs_rt_short axis · oblique · 8.0mm · 1.92mm/px · 1 of 13 slices shown (17 of 18)]
[im 1/13]
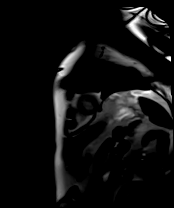

[Series 26: cine_trufi_cs_rt_short axis · oblique · 8.0mm · 1.92mm/px · 1 of 13 slices shown (18 of 18)]
[im 1/13]
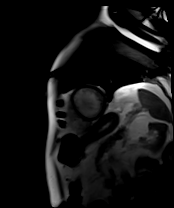

[Series 27: bSSFP · oblique · 6.0mm · 1.41mm/px · 1 of 16 slices shown (19 of 21)]
[im 1/16]
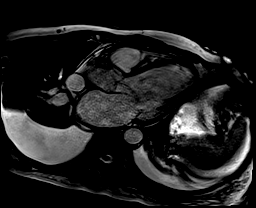

[Series 28: bSSFP · oblique · 6.0mm · 1.41mm/px · 1 of 16 slices shown (20 of 21)]
[im 1/16]
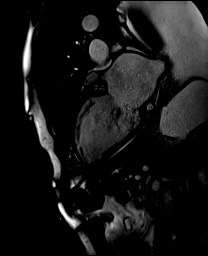

[Series 29: bSSFP · axial · 6.0mm · 1.41mm/px · 1 of 15 slices shown (21 of 21)]
[im 1/15]
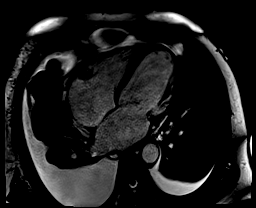

[Series 30: (id)_long_t1 · oblique · 8.0mm · 1.56mm/px · 1 of 24 slices shown]
[im 1/24]
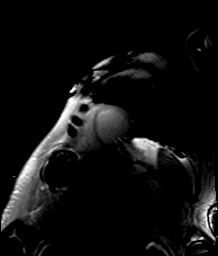

[Series 31: (id)_long_t1_moco · oblique · 8.0mm · 1.56mm/px · 1 of 24 slices shown]
[im 1/24]
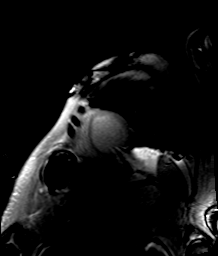

[Series 32: (id)_long_t1_moco_t1 · oblique · 8.0mm · 1.56mm/px · 1 of 6 slices shown]
[im 1/6]
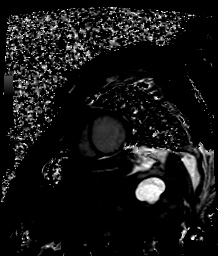

[Series 34: (id)_trufi · oblique · 8.0mm · 2.08mm/px · 1 of 9 slices shown]
[im 1/9]
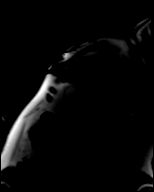

[Series 35: (id)_trufi_moco · oblique · 8.0mm · 2.08mm/px · 1 of 9 slices shown]
[im 1/9]
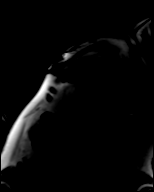

[45 of 48 positions shown; findings below may reference images not displayed]

FINDINGS: Left ventricle:

-Moderate dilatation

-Severe systolic dysfunction

-Markedly elevated ECV (40%)

-Elevated T2 values suggesting myocardial edema

-RV insertion site LGE

-Apical inferior subendocardial LGE

-Basal septal midwall LGE

LV EF:  10% (Normal 56-78%)

Absolute volumes:

LV EDV: 287mL (Normal 77-195 mL)

LV ESV: 258mL (Normal 19-72 mL)

LV SV: 29mL (Normal 51-133 mL)

CO: 1.8L/min (Normal 2.8-8.8 L/min)

Indexed volumes:

LV EDV: 133mL/sq-m (Normal 47-92 mL/sq-m)

LV ESV: 119mL/sq-m (Normal 13-30 mL/sq-m)

LV SV: 13mL/sq-m (Normal 32-62 mL/sq-m)

CI: 0.8L/min/sq-m (Normal 1.7-4.2 L/min/sq-m)

Right ventricle: Normal size with severe systolic dysfunction

RV EF:  19% (Normal 47-74%)

Absolute volumes:

RV EDV: 221mL (Normal 88-227 mL)

RV ESV: 180mL (Normal 23-103 mL)

RV SV: 42mL (Normal 52-138 mL)

CO: 2.6L/min (Normal 2.8-8.8 L/min)

Indexed volumes:

RV EDV: 102mL/sq-m (Normal 55-105 mL/sq-m)

RV ESV: 83mL/sq-m (Normal 15-43 mL/sq-m)

RV SV: 19mL/sq-m (Normal 32-64 mL/sq-m)

CI: 1.2L/min/sq-m (Normal 1.7-4.2 L/min/sq-m)

Left atrium: Moderate enlargement

Right atrium: Moderate enlargement

Mitral valve: Moderate regurgitation

Aortic valve: Moderate regurgitation

Tricuspid valve: Mild regurgitation

Pulmonic valve: No regurgitation

Aorta: Normal proximal ascending aorta

Pericardium: Small effusion

Extracardiac structures:Bilateral pleural effusion, small on left,
moderate on right
IMPRESSION: 1.  Moderate LV dilatation with severe systolic dysfunction (EF 10%)

2. Basal septal midwall LGE, which is a scar pattern seen in
nonischemic cardiomyopathies and associated with worse prognosis.
There is also apical inferior subendocardial LGE, suggesting small
infarct. Given no obstructive CAD seen on cath, would consider
cardiac sarcoid, which can also cause subendocardial LGE. Consider
CT chest to evaluate for evidence of pulmonary sarcoid and cardiac
PET to evaluate for cardiac sarcoid

3. RV insertion site LGE, which is a nonspecific scar pattern often
seen in setting of elevated pulmonary pressures

4. Elevated myocardial T2 values and ECV, suggesting myocardial
edema

5.  Normal RV size with severe systolic dysfunction (EF 19%)

6.  Small pericardial effusion

7.  Moderate right pleural effusion

## 2021-02-20 MED ORDER — DOCUSATE SODIUM 100 MG PO CAPS
200.0000 mg | ORAL_CAPSULE | Freq: Two times a day (BID) | ORAL | Status: DC
  Administered 2021-02-20 – 2021-02-26 (×12): 200 mg via ORAL
  Filled 2021-02-20 (×13): qty 2

## 2021-02-20 MED ORDER — POLYETHYLENE GLYCOL 3350 17 G PO PACK
17.0000 g | PACK | Freq: Every day | ORAL | Status: DC
  Administered 2021-02-20 – 2021-02-26 (×7): 17 g via ORAL
  Filled 2021-02-20 (×7): qty 1

## 2021-02-20 MED ORDER — GADOBUTROL 1 MMOL/ML IV SOLN
10.0000 mL | Freq: Once | INTRAVENOUS | Status: AC | PRN
  Administered 2021-02-20: 10 mL via INTRAVENOUS

## 2021-02-20 MED ORDER — SENNA 8.6 MG PO TABS
1.0000 | ORAL_TABLET | Freq: Every day | ORAL | Status: DC
  Administered 2021-02-20 – 2021-02-25 (×6): 8.6 mg via ORAL
  Filled 2021-02-20 (×6): qty 1

## 2021-02-20 MED ORDER — LOSARTAN POTASSIUM 25 MG PO TABS
12.5000 mg | ORAL_TABLET | Freq: Two times a day (BID) | ORAL | Status: DC
  Administered 2021-02-20 – 2021-02-22 (×3): 12.5 mg via ORAL
  Filled 2021-02-20 (×5): qty 1

## 2021-02-20 NOTE — Progress Notes (Signed)
Mobility Specialist Progress Note:   02/20/21 1234  Mobility  Activity Off unit   Will follow-up as time allows.    [facility] Designer, jewellery Phone [phone] Secondary Phone [phone]

## 2021-02-20 NOTE — Progress Notes (Signed)
Patient have 5 beats of VT patient was asleep at the time, BP low but stable. Cardiology PA notified. Will continue to monitor the patient.

## 2021-02-20 NOTE — Progress Notes (Addendum)
CARDIAC REHAB PHASE I   PRE:  Rate/Rhythm: 85 SR with PVCs    BP: sitting 92/63    SaO2: 90 RA  MODE:  Ambulation: 430 ft   POST:  Rate/Rhythm: 92 SR with PVCs    BP: sitting 104/64     SaO2: 93 RA   Slow walk without real assist, no major c/o, VSS. [id number]  Harriet Masson CES, ACSM 02/20/2021 3:07 PM

## 2021-02-20 NOTE — Progress Notes (Signed)
Advanced Heart Failure Rounding Note  PCP-Cardiologist: Dr Tresa Endo   Subjective:   Admit weight 226  Admitted for a/c CHF. Echo EF < 20% , RV severely reduced. LA/RA severely dilated. Frequent PVCs on tele. Now on amio and mexilitene.   PICC placed. Initial Co-ox 52%.  Remains on milrinone 0.25  Cath 11/10 with mild CAD. RHC on milrinone 0.25   Ao = 95/65 (79) LV = 91/29 RA = 15 RV = 54/17 PA = 60/24 (35) PCW = 29 (v = 35-40) Fick cardiac output/index = 6.3/2.9 PVR = 0.95 WU SVR = 904 FA sat = 97% PA sat = 68%, 72%  Remains on lasix gtt. Weight down 3 pounds.  Feels ok. Denies orthopnea, CP or PND.    Objective:   Weight Range: 89.4 kg Body mass index is 25.31 kg/m.   Vital Signs:   Temp:  [97.4 F (36.3 C)-98.5 F (36.9 C)] 98.5 F (36.9 C) (01/11 0824) Pulse Rate:  [0-104] 84 (01/11 0525) Resp:  [10-34] 19 (01/11 0824) BP: (99-136)/(65-108) 105/65 (01/11 0525) SpO2:  [0 %-96 %] 93 % (01/11 0525) Weight:  [89.4 kg] 89.4 kg (01/11 0525) Last BM Date: 02/18/21  Weight change: Filed Weights   02/18/21 0503 02/19/21 0449 02/20/21 0525  Weight: 93.6 kg 91 kg 89.4 kg    Intake/Output:   Intake/Output Summary (Last 24 hours) at 02/20/2021 1003 Last data filed at 02/20/2021 0535 Gross per 24 hour  Intake 574.14 ml  Output 1275 ml  Net -700.86 ml      Physical Exam   General:  Sitting up in bed . No resp difficulty HEENT: normal Neck: supple. JVP to jaw . Carotids 2+ bilat; no bruits. No lymphadenopathy or thryomegaly appreciated. Cor: PMI laterally displaced. tachy Lungs: clear Abdomen: soft, nontender, nondistended. No hepatosplenomegaly. No bruits or masses. Good bowel sounds. Extremities: no cyanosis, clubbing, rash, 1+ edema Neuro: alert & orientedx3, cranial nerves grossly intact. moves all 4 extremities w/o difficulty. Affect pleasant   Telemetry  SR 80-90s +PVCs Personally reviewed  Labs    CBC Recent Labs    02/19/21 0527  02/19/21 1506 02/19/21 1509 02/20/21 0449  WBC 8.1  --   --  7.8  HGB 12.2*   < > 12.9* 12.2*  HCT 36.6*   < > 38.0* 36.7*  MCV 88.4  --   --  88.9  PLT 135*  --   --  136*   < > = values in this interval not displayed.    Basic Metabolic Panel Recent Labs    09/81/19 0527 02/19/21 1506 02/19/21 1509 02/20/21 0449  NA 131*   < > 135 131*  K 3.6   < > 3.6 4.2  CL 91*  --   --  93*  CO2 30  --   --  29  GLUCOSE 166*  --   --  137*  BUN 6*  --   --  6*  CREATININE 1.19  --   --  1.27*  CALCIUM 8.3*  --   --  8.5*  MG 2.1  --   --  2.1   < > = values in this interval not displayed.   Liver Function Tests No results for input(s): AST, ALT, ALKPHOS, BILITOT, PROT, ALBUMIN in the last 72 hours. No results for input(s): LIPASE, AMYLASE in the last 72 hours. Cardiac Enzymes No results for input(s): CKTOTAL, CKMB, CKMBINDEX, TROPONINI in the last 72 hours.  BNP: BNP (last 3 results)  Recent Labs    02/01/21 1522 02/13/21 1709  BNP 2,411.9* 1,831.1*    ProBNP (last 3 results) No results for input(s): PROBNP in the last 8760 hours.   D-Dimer No results for input(s): DDIMER in the last 72 hours. Hemoglobin A1C No results for input(s): HGBA1C in the last 72 hours. Fasting Lipid Panel No results for input(s): CHOL, HDL, LDLCALC, TRIG, CHOLHDL, LDLDIRECT in the last 72 hours. Thyroid Function Tests No results for input(s): TSH, T4TOTAL, T3FREE, THYROIDAB in the last 72 hours.  Invalid input(s): FREET3  Other results:   Imaging    CARDIAC CATHETERIZATION  Result Date: 02/19/2021   Prox RCA to Mid RCA lesion is 30% stenosed.   Dist RCA lesion is 50% stenosed.   Mid Cx lesion is 60% stenosed.   The left ventricular ejection fraction is less than 25% by visual estimate. Findings: Milrinone 0.25 Ao = 95/65 (79) LV = 91/29 RA = 15 RV = 54/17 PA = 60/24 (35) PCW = 29 (v = 35-40) Fick cardiac output/index = 6.3/2.9 PVR = 0.95 WU SVR = 904 FA sat = 97% PA sat = 68%, 72%  Assessment: 1. 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 Mild CAD 3. Irritable LV with VT upon crossing AoV with JR 4 catheter Plan/Discussion: He is very tenuous. 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 Continue diuresis. Plan cMRI. Arvilla Meres, MD 3:39 PM    Medications:     Scheduled Medications:  Chlorhexidine Gluconate Cloth  6 each Topical Daily   digoxin  0.125 mg Oral Daily   enoxaparin (LOVENOX) injection  40 mg Subcutaneous Q24H   mexiletine  200 mg Oral Q12H   pneumococcal 23 valent vaccine  0.5 mL Intramuscular Tomorrow-1000   potassium chloride  40 mEq Oral BID   sodium chloride flush  3 mL Intravenous Q12H   sodium chloride flush  3 mL Intravenous Q12H   spironolactone  25 mg Oral Daily    Infusions:  sodium chloride     amiodarone 30 mg/hr (02/20/21 0303)   furosemide (LASIX) 200 mg in dextrose 5% 100 mL (2mg /mL) infusion 15 mg/hr (02/20/21 0303)   milrinone 0.25 mcg/kg/min (02/20/21 0303)    PRN Medications: sodium chloride, acetaminophen, albuterol, ondansetron (ZOFRAN) IV, sodium chloride flush    Patient Profile   Erik Chan is a 66 y.o. male with tobacco abuse and COPD, recently seen by Dr. Tresa Endo for several weeks of shortness of breath.   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  - Cath 1/10 moderate non-obstructive CAD. EF < 10% - ? PVC induced. > 20% PVC burden  on admit - Continue milrinone 0.25. CO-OX 55%  - Volume status still elevated. Continue lasix gtt - Continue spiro to 25 mg daily.  - Continue digoxin 0.125 - Start losartan 12.5 bid - No ? blocker w/ low output  - Continue Unna boots.  -cMRI today  2. PVCs/NSVT - High PVC burden 20%.   - Continues to have high PVC burden/NSVT. - Continue amio gtt.  Mexilitene added 1/10 - keep K > 4.0 and Mg > 2.0 - needs outpatient sleep study to r/o OSA - Will need Life Vest   3. COPD - Former heavy  smoker. Quit smoking 5 week sago     Length of Stay: 6  Arvilla Meres, MD  02/20/2021, 10:03 AM  Advanced Heart Failure Team Pager [phone] (M-F; 7a - 5p)  Please contact [facility] for night-coverage after hours (5p -7a ) and weekends on [email or web address]

## 2021-02-21 ENCOUNTER — Inpatient Hospital Stay (HOSPITAL_COMMUNITY): Payer: Medicare Other

## 2021-02-21 DIAGNOSIS — I5021 Acute systolic (congestive) heart failure: Secondary | ICD-10-CM | POA: Diagnosis not present

## 2021-02-21 DIAGNOSIS — I493 Ventricular premature depolarization: Secondary | ICD-10-CM | POA: Diagnosis not present

## 2021-02-21 LAB — POCT I-STAT EG7
Acid-Base Excess: 5 mmol/L — ABNORMAL HIGH (ref 0.0–2.0)
Bicarbonate: 30.1 mmol/L — ABNORMAL HIGH (ref 20.0–28.0)
Calcium, Ion: 1.18 mmol/L (ref 1.15–1.40)
HCT: 34 % — ABNORMAL LOW (ref 39.0–52.0)
Hemoglobin: 11.6 g/dL — ABNORMAL LOW (ref 13.0–17.0)
O2 Saturation: 72 %
Potassium: 3.2 mmol/L — ABNORMAL LOW (ref 3.5–5.1)
Sodium: 139 mmol/L (ref 135–145)
TCO2: 31 mmol/L (ref 22–32)
pCO2, Ven: 45.1 mmHg (ref 44.0–60.0)
pH, Ven: 7.432 — ABNORMAL HIGH (ref 7.250–7.430)
pO2, Ven: 37 mmHg (ref 32.0–45.0)

## 2021-02-21 LAB — BASIC METABOLIC PANEL
Anion gap: 9 (ref 5–15)
BUN: 7 mg/dL — ABNORMAL LOW (ref 8–23)
CO2: 30 mmol/L (ref 22–32)
Calcium: 8.5 mg/dL — ABNORMAL LOW (ref 8.9–10.3)
Chloride: 90 mmol/L — ABNORMAL LOW (ref 98–111)
Creatinine, Ser: 1.26 mg/dL — ABNORMAL HIGH (ref 0.61–1.24)
GFR, Estimated: >60 mL/min (ref >60)
Glucose, Bld: 106 mg/dL — ABNORMAL HIGH (ref 70–99)
Potassium: 3.8 mmol/L (ref 3.5–5.1)
Sodium: 129 mmol/L — ABNORMAL LOW (ref 135–145)

## 2021-02-21 LAB — COOXEMETRY PANEL
Carboxyhemoglobin: 1.3 % (ref 0.5–1.5)
Methemoglobin: 1 % (ref 0.0–1.5)
O2 Saturation: 55.9 %
Total hemoglobin: 12.4 g/dL (ref 12.0–16.0)

## 2021-02-21 LAB — CBC
HCT: 36.1 % — ABNORMAL LOW (ref 39.0–52.0)
Hemoglobin: 12.1 g/dL — ABNORMAL LOW (ref 13.0–17.0)
MCH: 29.6 pg (ref 26.0–34.0)
MCHC: 33.5 g/dL (ref 30.0–36.0)
MCV: 88.3 fL (ref 80.0–100.0)
Platelets: 131 10*3/uL — ABNORMAL LOW (ref 150–400)
RBC: 4.09 MIL/uL — ABNORMAL LOW (ref 4.22–5.81)
RDW: 16.2 % — ABNORMAL HIGH (ref 11.5–15.5)
WBC: 8 10*3/uL (ref 4.0–10.5)
nRBC: 0 % (ref 0.0–0.2)

## 2021-02-21 LAB — MAGNESIUM: Magnesium: 2.1 mg/dL (ref 1.7–2.4)

## 2021-02-21 IMAGING — DX DG CHEST 1V PORT
1 series · 1 of 1 positions shown · non-contrast
Comparison: [DATE]

CLINICAL DATA: Heart failure

EXAM:
PORTABLE CHEST 1 VIEW

[chest]
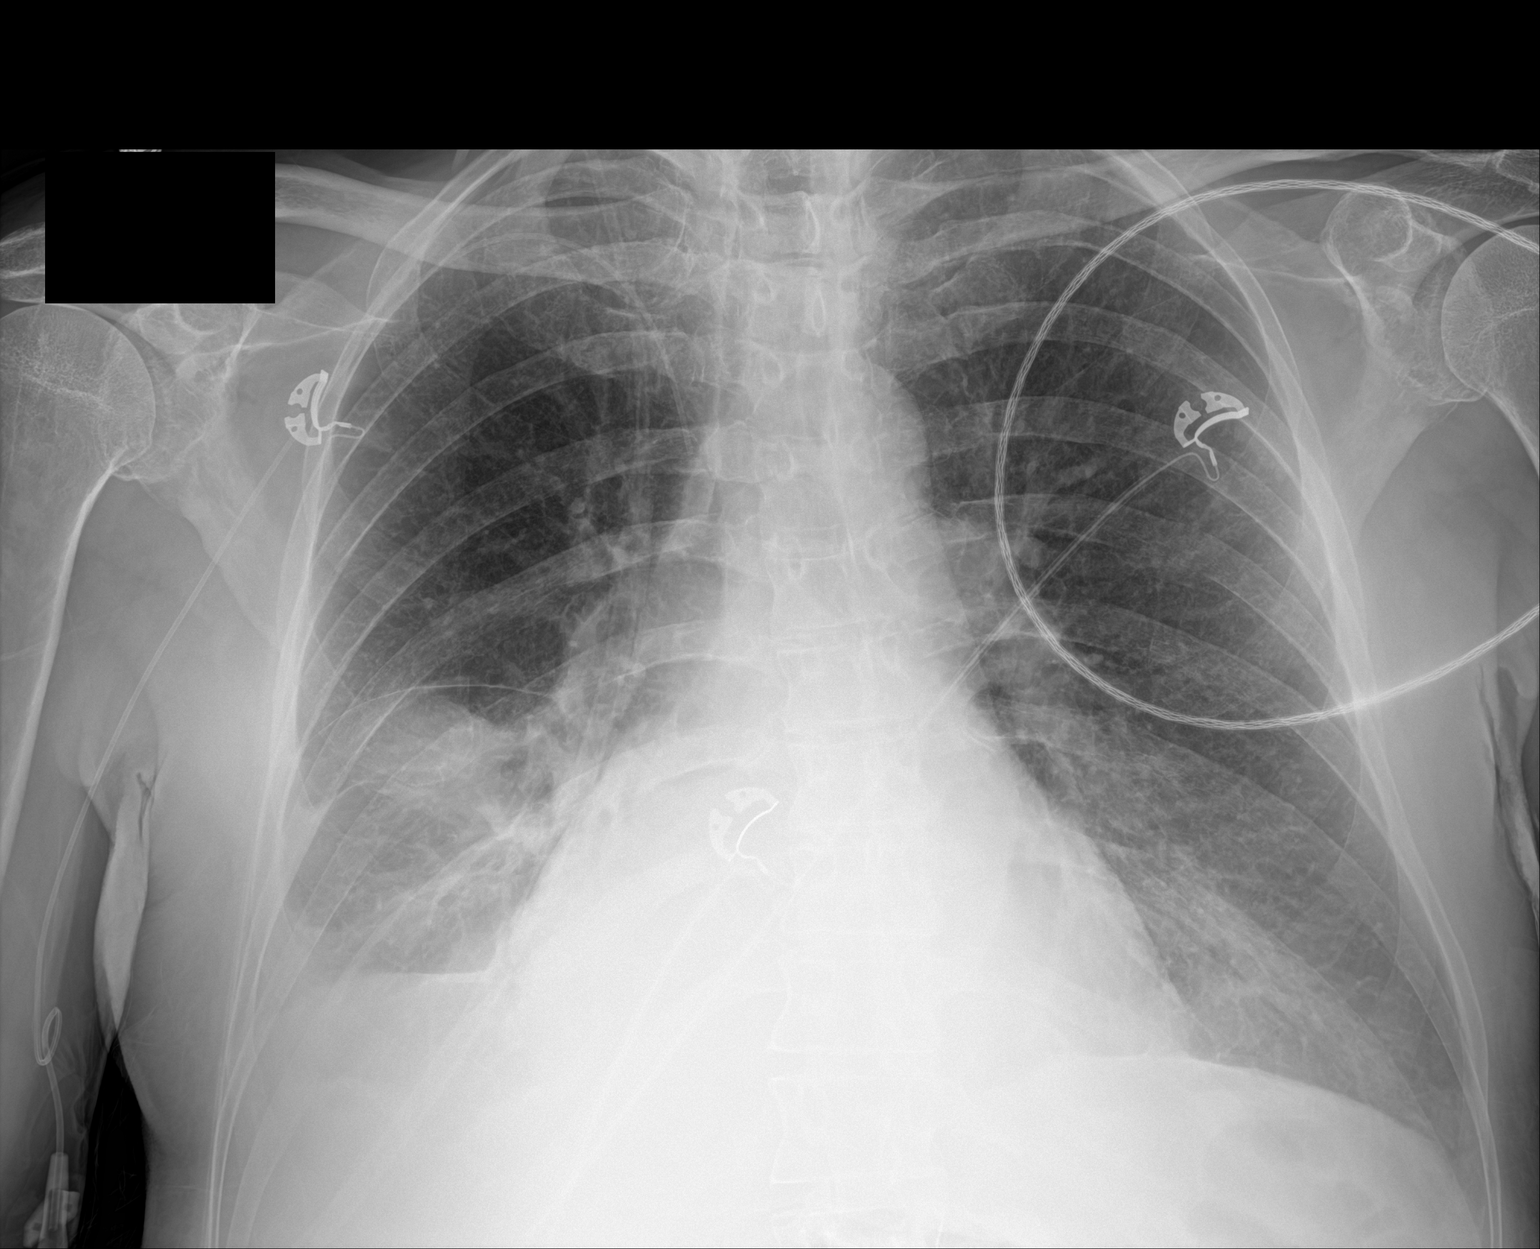

[1 of 1 positions shown; findings below may reference images not displayed]

FINDINGS: Right PICC line in place with the tip in the SVC. Heart is
borderline in size. Small right pleural effusion. Bilateral lower
lobe airspace opacities, right greater than left. No acute bony
abnormality.
IMPRESSION: Bilateral lower lobe airspace opacities, right greater than left
could reflect asymmetric edema or infection. This is increased
slightly since prior study.

Small right effusion.

## 2021-02-21 MED ORDER — POTASSIUM CHLORIDE CRYS ER 10 MEQ PO TBCR
40.0000 meq | EXTENDED_RELEASE_TABLET | Freq: Three times a day (TID) | ORAL | Status: DC
  Administered 2021-02-21 – 2021-02-22 (×4): 40 meq via ORAL
  Filled 2021-02-21: qty 2
  Filled 2021-02-21 (×3): qty 4

## 2021-02-21 MED ORDER — ALTEPLASE 2 MG IJ SOLR
2.0000 mg | Freq: Once | INTRAMUSCULAR | Status: AC
  Administered 2021-02-21: 2 mg
  Filled 2021-02-21: qty 2

## 2021-02-21 MED ORDER — METOLAZONE 2.5 MG PO TABS
2.5000 mg | ORAL_TABLET | Freq: Every day | ORAL | Status: DC
  Administered 2021-02-21: 2.5 mg via ORAL
  Filled 2021-02-21: qty 1

## 2021-02-21 MED ORDER — ALTEPLASE 2 MG IJ SOLR
2.0000 mg | Freq: Once | INTRAMUSCULAR | Status: DC
  Filled 2021-02-21: qty 2

## 2021-02-21 NOTE — Progress Notes (Signed)
Per IV nurse Dorie, it is ok to use the new peripheral IV in the right arm even though that is the same side as the PICC line.

## 2021-02-21 NOTE — Progress Notes (Signed)
BP (!) 86/52    Pulse 79    Temp 98.3 F (36.8 C)    Resp 20    Ht 6\' 2"  (1.88 m)    Wt 89.4 kg    SpO2 94%    BMI 25.31 kg/m    Despite of LOW BP, pt denies dizziness and no lightheadedness. Asymptomatic. Pt able to stand up and pee without  difficulty. Will continue to monitor.

## 2021-02-21 NOTE — Progress Notes (Signed)
Mobility Specialist Progress Note:   02/21/21 1205  Mobility  Activity Ambulated in hall  Level of Assistance Independent after set-up  Assistive Device None  Distance Ambulated (ft) 240 ft  Mobility Ambulated with assistance in hallway  Mobility Response Tolerated well  Mobility performed by Mobility specialist  $Mobility charge 1 Mobility   Pt received in bed willing to participate in mobility. No complaints of pain and asymptomatic. Pt left in bed with call bell in reach and all needs met.   [facility] Public librarian Phone [phone] Secondary Phone [phone]

## 2021-02-21 NOTE — Progress Notes (Signed)
Patient desats at 88% when deep asleep. Placed Hardy at 2l 02, otherwise spo2 at 93% in room air if awake.

## 2021-02-21 NOTE — Progress Notes (Signed)
PICC noted to have loop at distal end on cxr. VAST team at bedside to assess PICC, visible raised area noted proximal to insertion site, correlating with loop on  cxr. VAST RNs to re-dress and pull PICC back until loop no longer present. PICC noted to have restored blood return once PICC placement at 3cm exposed. During VAST RN dressing change patient states that his "first nurse today" accidentally pulled it out and attempted to reinserted the PICC during dressing change, and then occlusion issues followed. At this point PICC functioning appropriately, Primary RN Abigail to obtain second cxr to reassess PICC tip placement after pulling to 3cm out.

## 2021-02-21 NOTE — Care Management Important Message (Signed)
Important Message  Patient Details  Name: Erik Chan MRN: [id number] Date of Birth: 1955/04/11   Medicare Important Message Given:  Yes     Shelda Altes 02/21/2021, 7:31 AM

## 2021-02-21 NOTE — Progress Notes (Signed)
CBG sent to results - made in error. POC Testing department faxed necessary paperwork to remove from chart.

## 2021-02-21 NOTE — Progress Notes (Signed)
Orthopedic Tech Progress Note Patient Details:  Erik Chan [birth year] [id number]  Ortho Devices Type of Ortho Device: Radio broadcast assistant Ortho Device/Splint Location: BLE Ortho Device/Splint Interventions: Application, Ordered   Post Interventions Patient Tolerated: Well Instructions Provided: Care of device  Shanta Dorvil A Dejanira Pamintuan 02/21/2021, 4:45 PM

## 2021-02-21 NOTE — Progress Notes (Signed)
PICC assessment: Purple lumen with no blood return after partial dose of tPA. Red lumen occluded as well. Recommend chest xray to rule out mechanical occlusion.

## 2021-02-21 NOTE — Progress Notes (Addendum)
Advanced Heart Failure Rounding Note  PCP-Cardiologist: Dr Tresa Endo   Subjective:   Admit weight 226--->191 pounds.   Admitted for a/c CHF. Echo EF < 20% , RV severely reduced. LA/RA severely dilated. Frequent PVCs on tele. Now on amio and mexilitene.   PICC placed. Initial Co-ox 52%. Started on milrinone.   Cath 1/10 with mild CAD. RHC on milrinone 0.25. Placed back on lasix drip after cath.   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   Diuresing with lasix drip+ amio + milrinone. CO-OX 56%. Negative > 1.7 liters. Weight continues to trend down (35 pounds). Walked 430 feet.    Denies pain. Denies SOB   Objective:   Weight Range: 86.7 kg Body mass index is 24.55 kg/m.   Vital Signs:   Temp:  [98 F (36.7 C)-98.5 F (36.9 C)] 98.1 F (36.7 C) (01/12 0749) Pulse Rate:  [79-85] 80 (01/12 0749) Resp:  [16-21] 16 (01/12 0749) BP: (85-93)/(49-63) 88/57 (01/12 0749) SpO2:  [90 %-97 %] 94 % (01/12 0749) Weight:  [86.7 kg] 86.7 kg (01/12 0335) Last BM Date: 02/18/21  Weight change: Filed Weights   02/19/21 0449 02/20/21 0525 02/21/21 0335  Weight: 91 kg 89.4 kg 86.7 kg    Intake/Output:   Intake/Output Summary (Last 24 hours) at 02/21/2021 0919 Last data filed at 02/21/2021 0915 Gross per 24 hour  Intake 1448.3 ml  Output 3235 ml  Net -1786.7 ml      Physical Exam  CVP 13  General:   No resp difficulty HEENT: normal Neck: supple. JVP 11-12. Carotids 2+ bilat; no bruits. No lymphadenopathy or thryomegaly appreciated. Cor: PMI nondisplaced. Regular rate & rhythm. No rubs, gallops or murmurs. Lungs: clear on 2 liters.  Abdomen:  soft, nontender, nondistended. No hepatosplenomegaly. No bruits or masses. Good bowel sounds. Extremities: no cyanosis, clubbing, rash, R and LLE unna boots 1+edema. RUE PICC Neuro: alert & orientedx3, cranial nerves grossly intact. moves all 4 extremities w/o difficulty. Affect pleasant   Telemetry  SR 80-90s with frequent PVCs.   Labs    CBC Recent Labs    02/20/21 0449 02/21/21 0535  WBC 7.8 8.0  HGB 12.2* 12.1*  HCT 36.7* 36.1*  MCV 88.9 88.3  PLT 136* 131*    Basic Metabolic Panel Recent Labs    93/23/55 0449 02/21/21 0535  NA 131* 129*  K 4.2 3.8  CL 93* 90*  CO2 29 30  GLUCOSE 137* 106*  BUN 6* 7*  CREATININE 1.27* 1.26*  CALCIUM 8.5* 8.5*  MG 2.1 2.1   Liver Function Tests No results for input(s): AST, ALT, ALKPHOS, BILITOT, PROT, ALBUMIN in the last 72 hours. No results for input(s): LIPASE, AMYLASE in the last 72 hours. Cardiac Enzymes No results for input(s): CKTOTAL, CKMB, CKMBINDEX, TROPONINI in the last 72 hours.  BNP: BNP (last 3 results) Recent Labs    02/01/21 1522 02/13/21 1709  BNP 2,411.9* 1,831.1*    ProBNP (last 3 results) No results for input(s): PROBNP in the last 8760 hours.   D-Dimer No results for input(s): DDIMER in the last 72  hours. Hemoglobin A1C No results for input(s): HGBA1C in the last 72 hours. Fasting Lipid Panel No results for input(s): CHOL, HDL, LDLCALC, TRIG, CHOLHDL, LDLDIRECT in the last 72 hours. Thyroid Function Tests No results for input(s): TSH, T4TOTAL, T3FREE, THYROIDAB in the last 72 hours.  Invalid input(s): FREET3  Other results:   Imaging    MR CARDIAC MORPHOLOGY W WO CONTRAST  Result Date: 02/20/2021 CLINICAL DATA:  25M with severe heart failure, nonobstructive CAD on cath EXAM: CARDIAC MRI TECHNIQUE: The patient was scanned on a 1.5 Tesla Siemens magnet. A dedicated cardiac coil was used. Functional imaging was done using Fiesta sequences. 2,3, and 4 chamber views were done to assess for  RWMA's. Modified Simpson's rule using a short axis stack was used to calculate an ejection fraction on a dedicated work [employer]. The patient received 10 cc of Gadavist. After 10 minutes inversion recovery sequences were used to assess for infiltration and scar tissue. CONTRAST:  10 cc  of Gadavist FINDINGS: Left ventricle: -Moderate dilatation -Severe systolic dysfunction -Markedly elevated ECV (40%) -Elevated T2 values suggesting myocardial edema -RV insertion site LGE -Apical inferior subendocardial LGE -Basal septal midwall LGE LV EF:  10% (Normal 56-78%) Absolute volumes: LV EDV: (Normal 77-195 mL) LV ESV: (Normal 19-72 mL) LV SV: 94mL (Normal 51-133 mL) CO: 1.8L/min (Normal 2.8-8.8 L/min) Indexed volumes: LV EDV: 184mL/sq-m (Normal 47-92 mL/sq-m) LV ESV: 136mL/sq-m (Normal 13-30 mL/sq-m) LV SV: 38mL/sq-m (Normal 32-62 mL/sq-m) CI: 0.8L/min/sq-m (Normal 1.7-4.2 L/min/sq-m) Right ventricle: Normal size with severe systolic dysfunction RV EF:  19% (Normal 47-74%) Absolute volumes: RV EDV: (Normal 88-227 mL) RV ESV: (Normal 23-103 mL) RV SV: 90mL (Normal 52-138 mL) CO: 2.6L/min (Normal 2.8-8.8 L/min) Indexed volumes: RV EDV: 127mL/sq-m (Normal 55-105 mL/sq-m) RV ESV: 49mL/sq-m (Normal 15-43 mL/sq-m) RV SV: 74mL/sq-m (Normal 32-64 mL/sq-m) CI: 1.2L/min/sq-m (Normal 1.7-4.2 L/min/sq-m) Left atrium: Moderate enlargement Right atrium: Moderate enlargement Mitral valve: Moderate regurgitation Aortic valve: Moderate regurgitation Tricuspid valve: Mild regurgitation Pulmonic valve: No regurgitation Aorta: Normal proximal ascending aorta Pericardium: Small effusion Extracardiac structures:Bilateral pleural effusion, small on left, moderate on right IMPRESSION: 1.  Moderate LV dilatation with severe systolic dysfunction (EF 10%) 2. Basal septal midwall LGE, which is a scar pattern seen in nonischemic cardiomyopathies and associated with worse prognosis. There is also apical  inferior subendocardial LGE, suggesting small infarct. Given no obstructive CAD seen on cath, would consider cardiac sarcoid, which can also cause subendocardial LGE. Consider CT chest to evaluate for evidence of pulmonary sarcoid and cardiac PET to evaluate for cardiac sarcoid 3. RV insertion site LGE, which is a nonspecific scar pattern often seen in setting of elevated pulmonary pressures 4. Elevated myocardial T2 values and ECV, suggesting myocardial edema 5.  Normal RV size with severe systolic dysfunction (EF 19%) 6.  Small pericardial effusion 7.  Moderate right pleural effusion Electronically Signed   By: Epifanio Lesches M.D.   On: 02/20/2021 19:20     Medications:     Scheduled Medications:  Chlorhexidine Gluconate Cloth  6 each Topical Daily   digoxin  0.125 mg Oral Daily   docusate sodium  200 mg Oral BID   enoxaparin (LOVENOX) injection  40 mg Subcutaneous Q24H   losartan  12.5 mg Oral BID   metolazone  2.5 mg Oral Daily   mexiletine  200 mg Oral Q12H   pneumococcal 23 valent vaccine  0.5 mL Intramuscular Tomorrow-1000   polyethylene glycol  17 g Oral Daily  potassium chloride  40 mEq Oral BID   senna  1 tablet Oral Daily   sodium chloride flush  3 mL Intravenous Q12H   sodium chloride flush  3 mL Intravenous Q12H   spironolactone  25 mg Oral Daily    Infusions:  sodium chloride     amiodarone 30 mg/hr (02/21/21 0803)   furosemide (LASIX) 200 mg in dextrose 5% 100 mL (2mg /mL) infusion 15 mg/hr (02/21/21 0057)   milrinone 0.25 mcg/kg/min (02/21/21 0056)    PRN Medications: sodium chloride, acetaminophen, albuterol, ondansetron (ZOFRAN) IV, sodium chloride flush    Patient Profile   Erik Chan is a 66 y.o. male with tobacco abuse and COPD, recently seen by Dr. Claiborne Billings for several weeks of shortness of breath.   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  - Cath 1/10 moderate  non-obstructive CAD. EF < 10% - ? PVC induced. > 20% PVC burden  on admit - CMRI with severe biventricular heart failure with LGE basal septal wall  scar pattern, and RV insertion non specific scar pattern. Possible sarcoid. Check CT chest and myeloma panel.  -Continue milrinone 0.25. CO-OX 56% today  - CVP 13 Volume status still elevated. Continue lasix gtt. Weight down 35 pounds. Give 2.5 mg metolazone.  - Continue spiro to 25 mg daily.  - Continue digoxin 0.125 - Continue  losartan 12.5 bid - No ? blocker w/ low output  - Continue Unna boots.  - Renal function stable.   2. PVCs/NSVT - High PVC burden 20%.   - Continues to have high PVC burden/NSVT. - Continue amio gtt + Mexilitene - keep K > 4.0 and Mg > 2.0 - needs outpatient sleep study to r/o OSA - Will need Life Vest at d/c  - Supp K with addition of metolazone today.   3. COPD - Former heavy smoker. Quit smoking 5 week sago     Length of Stay: Brewer, NP  02/21/2021, 9:19 AM  Advanced Heart Failure Team Pager [phone] (M-F; 7a - 5p)  Please contact [facility] for night-coverage after hours (5p -7a ) and weekends on [email or web address]  Patient seen and examined with the above-signed Advanced Practice Provider and/or Housestaff. I personally reviewed laboratory data, imaging studies and relevant notes. I independently examined the patient and formulated the important aspects of the plan. I have edited the note to reflect any of my changes or salient points. I have personally discussed the plan with the patient and/or family.  Continues on milrinone and lasix gtt. Diuresing well. Co-ox remains marginal at 56% CVP 13.   Denies CP, SOB, orthopnea or PND.   cMRI results reviewed with him.   General:  Lying in bed No resp difficulty HEENT: normal Neck: supple. JVP to jaw Carotids 2+ bilat; no bruits. No lymphadenopathy or thryomegaly appreciated. Cor: PMI laterally displaced. Irregular rate & rhythm. No rubs, gallops  or murmurs. Lungs: clear Abdomen: soft, nontender, nondistended. No hepatosplenomegaly. No bruits or masses. Good bowel sounds. Extremities: no cyanosis, clubbing, rash, + UNNA 1-2+ edema Neuro: alert & orientedx3, cranial nerves grossly intact. moves all 4 extremities w/o difficulty. Affect pleasant  Remains quite tenuous. EF 10%. Possible infiltrative process on cMRI. Continue diuresis with milrinone support. Suppress PVCs. Will check CT to look for sarcoid. Check amyloid labs. Will need LifeVest at time of d/c.   [name], MD  10:25 AM

## 2021-02-21 NOTE — Progress Notes (Signed)
Erik Chan, aware that PICC line is still occluded. Will obtain new peripheral site.

## 2021-02-21 NOTE — Progress Notes (Signed)
Darrick Grinder, NP notified about PICC line being occluded and amio and milrinone being paused until the problem is resolved.

## 2021-02-22 ENCOUNTER — Inpatient Hospital Stay (HOSPITAL_COMMUNITY): Payer: Medicare Other

## 2021-02-22 ENCOUNTER — Ambulatory Visit: Payer: Medicare Other | Admitting: Adult Health

## 2021-02-22 DIAGNOSIS — I5021 Acute systolic (congestive) heart failure: Secondary | ICD-10-CM | POA: Diagnosis not present

## 2021-02-22 DIAGNOSIS — Z9889 Other specified postprocedural states: Secondary | ICD-10-CM | POA: Diagnosis not present

## 2021-02-22 DIAGNOSIS — I5023 Acute on chronic systolic (congestive) heart failure: Principal | ICD-10-CM

## 2021-02-22 DIAGNOSIS — J90 Pleural effusion, not elsewhere classified: Secondary | ICD-10-CM

## 2021-02-22 DIAGNOSIS — Z95828 Presence of other vascular implants and grafts: Secondary | ICD-10-CM

## 2021-02-22 LAB — BASIC METABOLIC PANEL
Anion gap: 12 (ref 5–15)
BUN: 9 mg/dL (ref 8–23)
CO2: 33 mmol/L — ABNORMAL HIGH (ref 22–32)
Calcium: 9 mg/dL (ref 8.9–10.3)
Chloride: 84 mmol/L — ABNORMAL LOW (ref 98–111)
Creatinine, Ser: 1.31 mg/dL — ABNORMAL HIGH (ref 0.61–1.24)
GFR, Estimated: >60 mL/min (ref >60)
Glucose, Bld: 97 mg/dL (ref 70–99)
Potassium: 3.6 mmol/L (ref 3.5–5.1)
Sodium: 129 mmol/L — ABNORMAL LOW (ref 135–145)

## 2021-02-22 LAB — COOXEMETRY PANEL
Carboxyhemoglobin: 1.6 % — ABNORMAL HIGH (ref 0.5–1.5)
Carboxyhemoglobin: 1.4 % (ref 0.5–1.5)
Methemoglobin: 0.8 % (ref 0.0–1.5)
Methemoglobin: 0.8 % (ref 0.0–1.5)
O2 Saturation: 52.2 %
O2 Saturation: 54.9 %
Total hemoglobin: 13.4 g/dL (ref 12.0–16.0)
Total hemoglobin: 13.5 g/dL (ref 12.0–16.0)

## 2021-02-22 LAB — BODY FLUID CELL COUNT WITH DIFFERENTIAL
Eos, Fluid: 0 %
Lymphs, Fluid: 82 %
Monocyte-Macrophage-Serous Fluid: 8 % — ABNORMAL LOW (ref 50–90)
Neutrophil Count, Fluid: 10 % (ref 0–25)
Total Nucleated Cell Count, Fluid: 262 cu mm (ref 0–1000)

## 2021-02-22 LAB — LACTATE DEHYDROGENASE: LDH: 190 U/L (ref 98–192)

## 2021-02-22 LAB — GLUCOSE, CAPILLARY: Glucose-Capillary: 122 mg/dL — ABNORMAL HIGH (ref 70–99)

## 2021-02-22 LAB — PROTEIN, TOTAL: Total Protein: 5.9 g/dL — ABNORMAL LOW (ref 6.5–8.1)

## 2021-02-22 LAB — PROTEIN, PLEURAL OR PERITONEAL FLUID: Total protein, fluid: <3 g/dL

## 2021-02-22 LAB — CBC
HCT: 38 % — ABNORMAL LOW (ref 39.0–52.0)
Hemoglobin: 12.8 g/dL — ABNORMAL LOW (ref 13.0–17.0)
MCH: 29.4 pg (ref 26.0–34.0)
MCHC: 33.7 g/dL (ref 30.0–36.0)
MCV: 87.2 fL (ref 80.0–100.0)
Platelets: 154 10*3/uL (ref 150–400)
RBC: 4.36 MIL/uL (ref 4.22–5.81)
RDW: 15.9 % — ABNORMAL HIGH (ref 11.5–15.5)
WBC: 7.9 10*3/uL (ref 4.0–10.5)
nRBC: 0 % (ref 0.0–0.2)

## 2021-02-22 LAB — CHOLESTEROL, TOTAL: Cholesterol: 142 mg/dL (ref 0–200)

## 2021-02-22 LAB — LACTATE DEHYDROGENASE, PLEURAL OR PERITONEAL FLUID: LD, Fluid: 73 U/L — ABNORMAL HIGH (ref 3–23)

## 2021-02-22 LAB — MAGNESIUM: Magnesium: 2.1 mg/dL (ref 1.7–2.4)

## 2021-02-22 IMAGING — DX DG CHEST 1V PORT
1 series · 1 of 1 positions shown · non-contrast
Comparison: [DATE]

CLINICAL DATA: Follow-up right pleural effusion.

EXAM:
PORTABLE CHEST 1 VIEW

[chest]
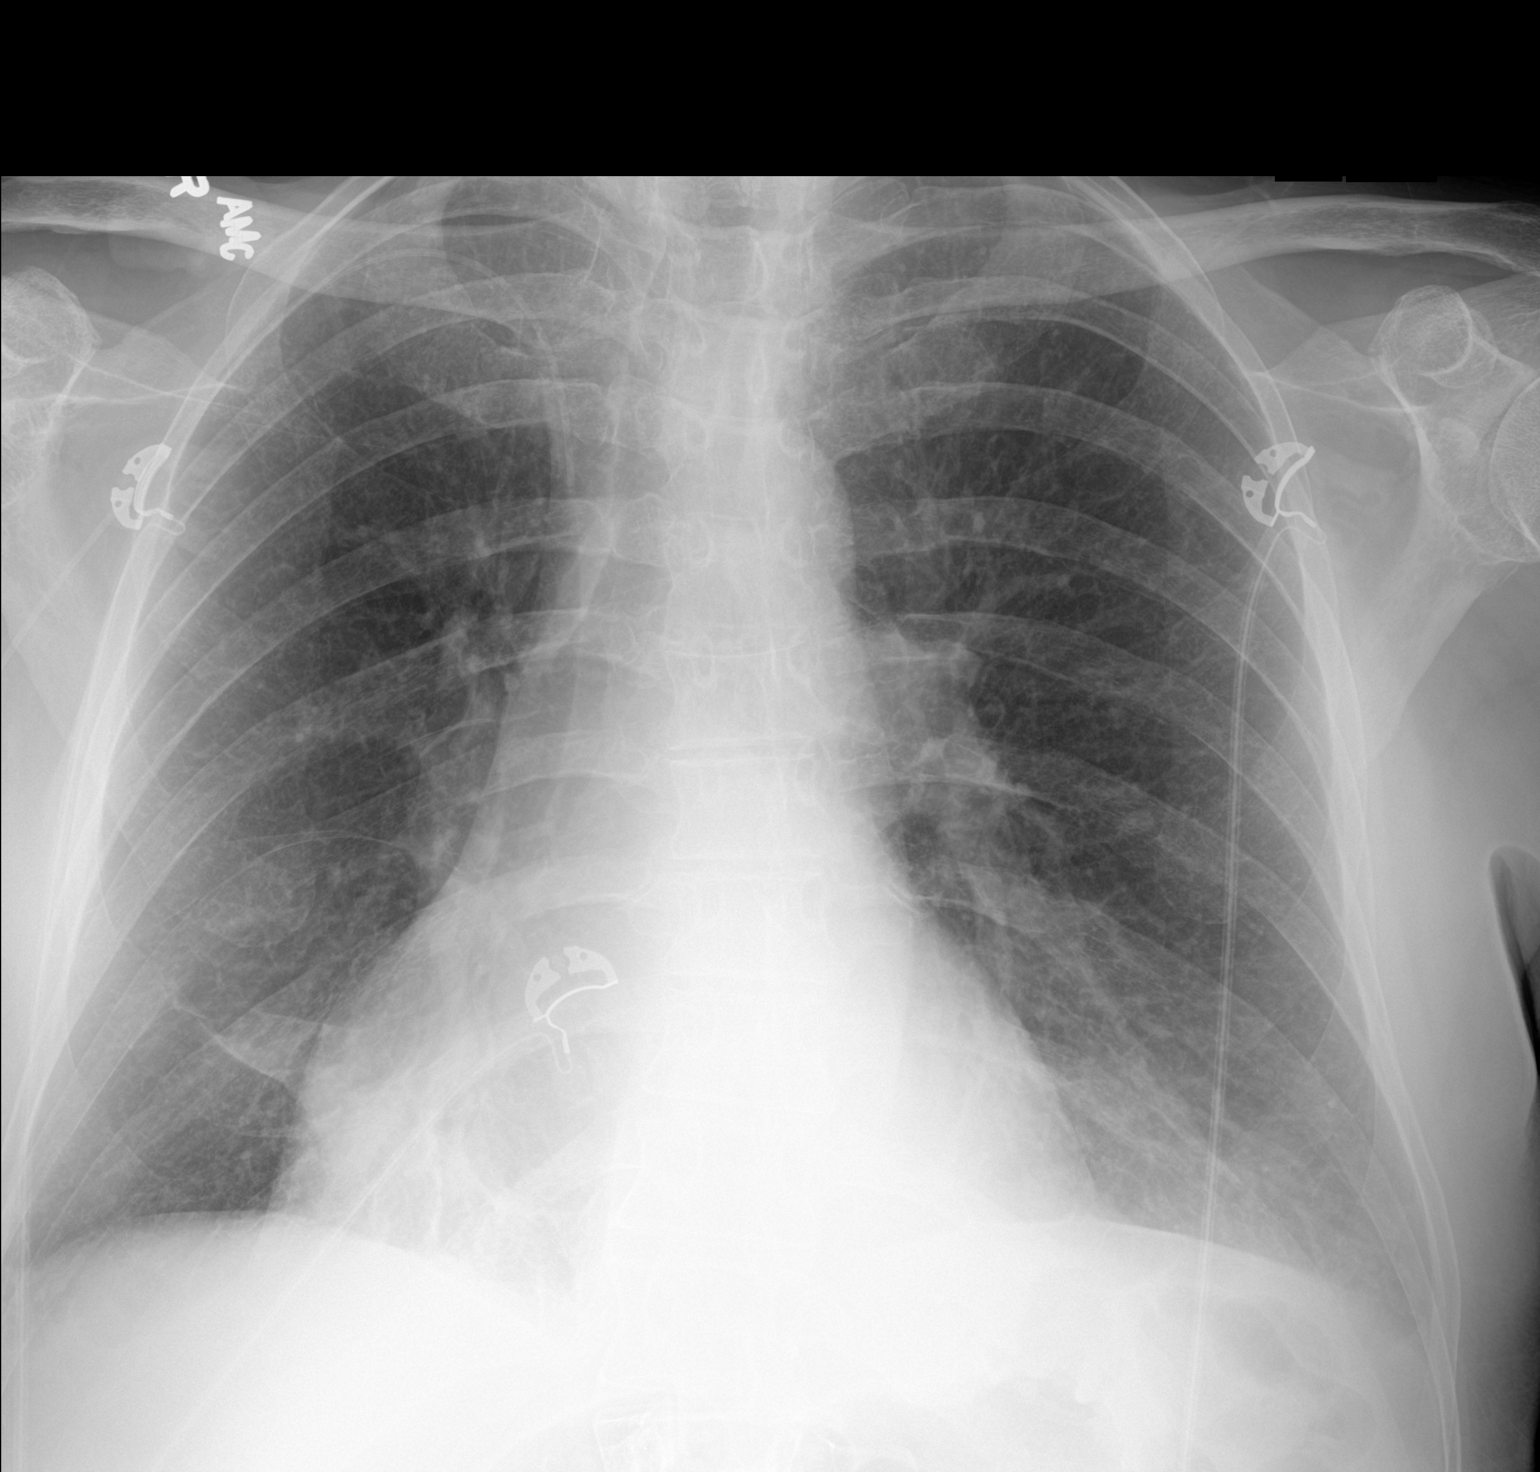

[1 of 1 positions shown; findings below may reference images not displayed]

FINDINGS: Stable cardiomegaly. Right arm PICC line remains in appropriate
position.

Previously seen small right pleural effusion and right basilar
infiltrate or atelectasis have resolved since previous study. Left
lung remains clear. No pneumothorax visualized.
IMPRESSION: Resolution of small right pleural effusion, and right basilar
infiltrate or atelectasis. No acute findings.

## 2021-02-22 MED ORDER — LOSARTAN POTASSIUM 25 MG PO TABS
12.5000 mg | ORAL_TABLET | Freq: Every day | ORAL | Status: DC
  Administered 2021-02-23 – 2021-02-25 (×3): 12.5 mg via ORAL
  Filled 2021-02-22 (×3): qty 1

## 2021-02-22 MED ORDER — SODIUM CHLORIDE 0.9 % IV BOLUS
500.0000 mL | Freq: Once | INTRAVENOUS | Status: AC
  Administered 2021-02-22: 500 mL via INTRAVENOUS

## 2021-02-22 MED ORDER — MIDODRINE HCL 5 MG PO TABS: 2.5000 mg | ORAL_TABLET | Freq: Three times a day (TID) | ORAL | Status: DC

## 2021-02-22 MED ORDER — MILRINONE LACTATE IN DEXTROSE 20-5 MG/100ML-% IV SOLN
0.1250 ug/kg/min | INTRAVENOUS | Status: DC
  Administered 2021-02-22: 0.125 ug/kg/min via INTRAVENOUS

## 2021-02-22 MED ORDER — POTASSIUM CHLORIDE CRYS ER 20 MEQ PO TBCR: 40.0000 meq | EXTENDED_RELEASE_TABLET | Freq: Two times a day (BID) | ORAL | Status: DC

## 2021-02-22 MED ORDER — MIDODRINE HCL 5 MG PO TABS
2.5000 mg | ORAL_TABLET | Freq: Three times a day (TID) | ORAL | Status: DC
  Administered 2021-02-23 – 2021-02-26 (×10): 2.5 mg via ORAL
  Filled 2021-02-22 (×10): qty 1

## 2021-02-22 MED ORDER — MIDODRINE HCL 5 MG PO TABS
2.5000 mg | ORAL_TABLET | Freq: Once | ORAL | Status: AC
  Administered 2021-02-22: 2.5 mg via ORAL
  Filled 2021-02-22: qty 1

## 2021-02-22 MED ORDER — SODIUM CHLORIDE 0.9 % IV BOLUS
500.0000 mL | Freq: Once | INTRAVENOUS | Status: AC
Start: 2021-02-22 — End: 2021-02-22
  Administered 2021-02-22: 500 mL via INTRAVENOUS

## 2021-02-22 MED ORDER — FUROSEMIDE 40 MG PO TABS
40.0000 mg | ORAL_TABLET | Freq: Two times a day (BID) | ORAL | Status: DC
  Administered 2021-02-22: 40 mg via ORAL
  Filled 2021-02-22 (×2): qty 1

## 2021-02-22 MED ORDER — MIDODRINE HCL 5 MG PO TABS
2.5000 mg | ORAL_TABLET | Freq: Three times a day (TID) | ORAL | Status: DC
  Administered 2021-02-22: 2.5 mg via ORAL
  Filled 2021-02-22: qty 1

## 2021-02-22 MED ORDER — MIDODRINE HCL 5 MG PO TABS
5.0000 mg | ORAL_TABLET | Freq: Once | ORAL | Status: AC
  Administered 2021-02-22: 5 mg via ORAL
  Filled 2021-02-22: qty 1

## 2021-02-22 NOTE — Procedures (Signed)
Thoracentesis  Procedure Note  Erik Chan  [id number]  1956/02/08  Date:02/22/21  Time:6:03 PM   Provider Performing:Pete E Tanja Port   Procedure: Thoracentesis with imaging guidance (27062)  Indication(s) Pleural Effusion  Consent Risks of the procedure as well as the alternatives and risks of each were explained to the patient and/or caregiver.  Consent for the procedure was obtained and is signed in the bedside chart  Anesthesia Topical only with 1% lidocaine    Time Out Verified patient identification, verified procedure, site/side was marked, verified correct patient position, special equipment/implants available, medications/allergies/relevant history reviewed, required imaging and test results available.   Sterile Technique Maximal sterile technique including full sterile barrier drape, hand hygiene, sterile gown, sterile gloves, mask, hair covering, sterile ultrasound probe cover (if used).  Procedure Description Ultrasound was used to identify appropriate pleural anatomy for placement and overlying skin marked.  Area of drainage cleaned and draped in sterile fashion. Lidocaine was used to anesthetize the skin and subcutaneous tissue.  1200 cc's of clear appearing fluid was drained from the right pleural space. Catheter then removed and bandaid applied to site.   Complications/Tolerance None; patient tolerated the procedure well. Chest X-ray is ordered to confirm no post-procedural complication.   EBL Minimal   Specimen(s) Pleural fluid           Thoracentesis  Procedure Note  Erik Chan  [id number]  1955-06-10  Date:02/22/21  Time:6:03 PM   Provider Performing:Pete E Tanja Port   Procedure: Thoracentesis with imaging guidance (76160)  Indication(s) Pleural Effusion  Consent Risks of the procedure as well as the alternatives and risks of each were explained to the patient and/or caregiver.  Consent for the procedure was obtained and is  signed in the bedside chart  Anesthesia Topical only with 1% lidocaine    Time Out Verified patient identification, verified procedure, site/side was marked, verified correct patient position, special equipment/implants available, medications/allergies/relevant history reviewed, required imaging and test results available.   Sterile Technique Maximal sterile technique including full sterile barrier drape, hand hygiene, sterile gown, sterile gloves, mask, hair covering, sterile ultrasound probe cover (if used).  Procedure Description Ultrasound was used to identify appropriate pleural anatomy for placement and overlying skin marked.  Area of drainage cleaned and draped in sterile fashion. Lidocaine was used to anesthetize the skin and subcutaneous tissue.  1200 cc's of clear  appearing fluid was drained from the right pleural space. Catheter then removed and bandaid applied to site.   Complications/Tolerance None; patient tolerated the procedure well. Chest X-ray is ordered to confirm no post-procedural complication.   EBL Minimal   Specimen(s) Pleural fluid     Right effusion prior to thora   Erik Chan ACNP-BC [facility] Pulmonary/Critical Care Pager # [phone] OR # [phone] if no answer

## 2021-02-22 NOTE — Progress Notes (Signed)
Mobility Specialist: Progress Note   02/22/21 1215  Mobility  Activity Ambulated in hall  Level of Assistance Contact guard assist, steadying assist  Assistive Device None  Distance Ambulated (ft) 200 ft  Mobility Ambulated with assistance in hallway  Mobility Response Tolerated well  Mobility performed by Mobility specialist  $Mobility charge 1 Mobility   Pre-Mobility: 87 HR Post-Mobility: 86 HR  Pt independent with bed mobility and standing, No c/o throughout ambulation. Pt back to bed after walk with call bell at his side and NT present in the room.   [facility] Rashawn Rolon Mobility Specialist Mobility Specialist 4 [place]: [phone] Mobility Specialist 2 [place] and 6 [place]: [phone]

## 2021-02-22 NOTE — Significant Event (Signed)
Rapid Response Event Note   Pt seen as follow-up from day shift RR call. SBP-60s. Pt awake, alert, and talking on the phone. Pt skin warm and dry. Dr. [name] updated.  Interventions: 2.5mg  midodrine po @ 1943 500cc NS bolus(1.5 L total today) @ 2008 2.5 mg midodrine(10mg  total today) @ 2039  Plan of Care:  Goal SBP>80. Continue to monitor closely. Call RRT if assistance needed.    Terrilyn Saver, RN

## 2021-02-22 NOTE — TOC CM/SW Note (Addendum)
Successfully faxed referral to [facility] for [place], Heart Failure TOC CM [phone]   HF TOC CM spoke to pt about HH. Pt agreeable to [facility] if needed. Offered choice for [facility]. Pt agreeable to [facility]. Home IV infusion Coordinator with [facility], Pam following, Will need orders for [facility] and IV medications. Will continue to follow for dc needs.  [place], Heart Failure TOC CM [phone]

## 2021-02-22 NOTE — Consult Note (Signed)
Name: Erik Chan MRN: [id number] DOB: May 10, 1955    ADMISSION DATE:  02/13/2021 CONSULTATION DATE:  02/22/2021   REFERRING MD :  Bensimhon, CHF  CHIEF COMPLAINT: Dyspnea, bilateral pleural effusions   HISTORY OF PRESENT ILLNESS: 66 year old man presented with acute on chronic systolic heart failure.  He has known EF less than 20% and hypokinetic RV.  He was diuresed with Lasix drip and metolazone, and is dropped 47 pounds from admit.  He is maintained on milrinone" oximetry was 52% today on 0.25 mics of milrinone cardiac cath has only showed mild CAD and right heart cath showed PVR of 0.95 WU with RA pressure 15.  Cardiac MRI showed EF of 10% with moderate right pleural effusion and small pericardial effusion.  Question of cardiac sarcoidosis was raised. CT chest without contrast did not show any evidence of pulmonary sarcoidosis with mild emphysema, moderate right effusion and small left effusion.  3 mm nodule was noted in the right middle lobe  PAST MEDICAL HISTORY :  Past Medical History:  Diagnosis Date   CHF (congestive heart failure) ([facility]) 02/13/2021   LVEF less than 20%   COPD (chronic obstructive pulmonary disease) (H[facility])    Fibromyalgia    Past Surgical History:  Procedure Laterality Date   RIGHT/LEFT HEART CATH AND CORONARY ANGIOGRAPHY N/A 02/19/2021   Procedure: RIGHT/LEFT HEART CATH AND CORONARY ANGIOGRAPHY;  Surgeon: Jolaine Artist, MD;  Location: [facility];  Service: Cardiovascular;  Laterality: N/A;   Prior to Admission medications   Medication Sig Start Date End Date Taking? Authorizing Provider  acetaminophen (TYLENOL) 500 MG tablet Take 500 mg by mouth every 6 (six) hours as needed for mild pain, fever or headache.   Yes [provider]  albuterol (VENTOLIN HFA) 108 (90 Base) MCG/ACT inhaler Inhale 1-2 puffs into the lungs every 4 (four) hours as needed for wheezing or shortness of breath. 01/28/21  Yes [provider]  furosemide  (LASIX) 40 MG tablet Take 1 tablet (40 mg) in the AM, and 1 tablet (40 mg) in the PM Patient taking differently: Take 40 mg by mouth 2 (two) times daily. 02/01/21  Yes Troy Sine, MD  losartan (COZAAR) 25 MG tablet Take 1 tablet (25 mg total) by mouth daily. Patient taking differently: Take 25 mg by mouth every evening. 02/05/21 05/06/21 Yes Troy Sine, MD  metoprolol succinate (TOPROL XL) 25 MG 24 hr tablet Take 0.5 tablet (12.5 mg) for 4 days, then increase to 1 tablet (25 mg) daily Patient taking differently: Take 25 mg by mouth daily. 02/01/21  Yes Troy Sine, MD   Allergies  Allergen Reactions   Shellfish-Derived Products Hives and Rash    FAMILY HISTORY:  Family History  Problem Relation Age of Onset   Asthma Mother    Allergies Mother    Fibromyalgia Mother    Osteoarthritis Mother    Heart Problems Father    Osteoarthritis Maternal Grandmother    SOCIAL HISTORY:  reports that he has quit smoking. His smoking use included pipe and cigarettes. He has a 30.00 pack-year smoking history. He has never used smokeless tobacco. He reports that he does not drink alcohol and does not use drugs.  REVIEW OF SYSTEMS:   Positive for shortness of breath Lower extremity edema improved Significant weight loss during hospital admission   Constitutional: Negative for fever, chills, malaise/fatigue and diaphoresis.  HENT: Negative for hearing loss, ear pain, nosebleeds, congestion, sore throat, neck pain, tinnitus and ear discharge.  Eyes: Negative for blurred vision, double vision, photophobia, pain, discharge and redness.  Respiratory: Negative for cough, hemoptysis, sputum production, shortness of breath, wheezing and stridor.   Cardiovascular: Negative for chest pain, palpitations, orthopnea, claudication and PND.  Gastrointestinal: Negative for heartburn, nausea, vomiting, abdominal pain, diarrhea, constipation, blood in stool and melena.  Genitourinary: Negative for  dysuria, urgency, frequency, hematuria and flank pain.  Musculoskeletal: Negative for myalgias, back pain, joint pain and falls.  Skin: Negative for itching and rash.  Neurological: Negative for dizziness, tingling, tremors, sensory change, speech change, focal weakness, seizures, loss of consciousness, weakness and headaches.  Endo/Heme/Allergies: Negative for environmental allergies and polydipsia. Does not bruise/bleed easily.  SUBJECTIVE:   VITAL SIGNS: Temp:  [97.6 F (36.4 C)-98.1 F (36.7 C)] 97.9 F (36.6 C) (01/13 1703) Pulse Rate:  [85-88] 86 (01/13 1703) Resp:  [15-25] 24 (01/13 1740) BP: (85-99)/(54-64) 85/54 (01/13 1703) SpO2:  [93 %-99 %] 94 % (01/13 1703) Weight:  [81.5 kg] 81.5 kg (01/13 0337)  PHYSICAL EXAMINATION: Gen. Pleasant, well-nourished, in no distress, normal affect ENT - no lesions, no post nasal drip Neck: No JVD, no thyromegaly, no carotid bruits Lungs: no use of accessory muscles, no dullness to percussion, decreased right base without rales or rhonchi  Cardiovascular: Rhythm regular, heart sounds  normal, no murmurs, no peripheral edema Abdomen: soft and non-tender, no hepatosplenomegaly, BS normal. Musculoskeletal: Both legs wrapped in Ace bandage no deformities, no cyanosis or clubbing Neuro:  alert, non focal Skin:  Warm, no lesions/ rash   Recent Labs  Lab 02/20/21 0449 02/21/21 0535 02/22/21 0500  NA 131* 129* 129*  K 4.2 3.8 3.6  CL 93* 90* 84*  CO2 29 30 33*  BUN 6* 7* 9  CREATININE 1.27* 1.26* 1.31*  GLUCOSE 137* 106* 97   Recent Labs  Lab 02/20/21 0449 02/21/21 0535 02/22/21 0500  HGB 12.2* 12.1* 12.8*  HCT 36.7* 36.1* 38.0*  WBC 7.8 8.0 7.9  PLT 136* 131* 154   CT CHEST WO CONTRAST  Result Date: 02/22/2021 CLINICAL DATA:  Chronic shortness of breath. Possible cardiac sarcoidosis on recent cardiac MRI. EXAM: CT CHEST WITHOUT CONTRAST TECHNIQUE: Multidetector CT imaging of the chest was performed following the standard  protocol without IV contrast. RADIATION DOSE REDUCTION: This exam was performed according to the departmental dose-optimization program which includes automated exposure control, adjustment of the mA and/or kV according to patient size and/or use of iterative reconstruction technique. COMPARISON:  Chest x-ray from yesterday. FINDINGS: Cardiovascular: No significant vascular findings. Mild cardiomegaly. No pericardial effusion. No thoracic aortic aneurysm. Three-vessel coronary artery atherosclerotic calcification. Right upper extremity PICC line with tip in the proximal SVC. Mediastinum/Nodes: No enlarged mediastinal or axillary lymph nodes. Thyroid gland, trachea, and esophagus demonstrate no significant findings. Lungs/Pleura: Mild centrilobular and paraseptal emphysema. Moderate right and small left pleural effusions. Compressive atelectasis of the right greater than left lower lobes. No consolidation or pneumothorax. 3 mm nodule in the anterior right middle lobe (series 6, image 99). Upper Abdomen: No acute abnormality. Small perihepatic ascites. 4.8 cm left renal simple cyst. Musculoskeletal: No acute or significant osseous findings. IMPRESSION: 1. No evidence of pulmonary sarcoidosis. 2. Moderate right and small left pleural effusions with compressive atelectasis of the right greater than left lower lobes. 3. 3 mm nodule in the right middle lobe. No follow-up needed if patient is low-risk. Non-contrast chest CT can be considered in 12 months if patient is high-risk. This recommendation follows the consensus statement: Guidelines for Management of  Incidental Pulmonary Nodules Detected on CT Images: From the Fleischner Society 2017; Radiology 2017; 284:228-243. 4. Small perihepatic ascites. 5. Emphysema (ICD10-J43.9). Electronically Signed   By: Titus Dubin M.D.   On: 02/22/2021 10:02   DG CHEST PORT 1 VIEW  Result Date: 02/21/2021 CLINICAL DATA:  Heart failure EXAM: PORTABLE CHEST 1 VIEW COMPARISON:   02/21/2021 FINDINGS: Right PICC line is unchanged. Heart is borderline in size. Bilateral lower lobe airspace opacities are again noted, right greater than left with small right effusion. No change since earlier study. IMPRESSION: Bilateral lower lobe opacities, right greater than left with right effusion. No change. Electronically Signed   By: Rolm Baptise M.D.   On: 02/21/2021 23:26   DG CHEST PORT 1 VIEW  Result Date: 02/21/2021 CLINICAL DATA:  Heart failure EXAM: PORTABLE CHEST 1 VIEW COMPARISON:  02/13/2021 FINDINGS: Right PICC line in place with the tip in the SVC. Heart is borderline in size. Small right pleural effusion. Bilateral lower lobe airspace opacities, right greater than left. No acute bony abnormality. IMPRESSION: Bilateral lower lobe airspace opacities, right greater than left could reflect asymmetric edema or infection. This is increased slightly since prior study. Small right effusion. Electronically Signed   By: Rolm Baptise M.D.   On: 02/21/2021 21:46    ASSESSMENT / PLAN:  Severe cardiomyopathy with bilateral effusions, reviewed CT scan, left effusion is small and right moderate.  He has diuresed 47 pounds since admission to the point of being hypotensive but effusions persist No evidence of pulmonary sarcoidosis  -Reasonable to proceed with thoracentesis, discussed with CHF attending -Therapeutic thoracentesis was performed on the right with removal of 1200 cc of straw-yellow fluid , will await pleural fluid analysis, expect transudate in which case PCCM will sign off  Emphysema -he has a 30-pack-year history of smoking, can obtain PFTs as outpatient  3 mm pulmonary nodule is likely of no significance  Kara Mead MD. Shade Flood. [facility] Pager [phone] If no response call [phone]    02/22/2021, 6:32 PM

## 2021-02-22 NOTE — Progress Notes (Signed)
Message to Holstein, RN informing her PICC is OK to use. Please remove PIV from R forearm.

## 2021-02-22 NOTE — Progress Notes (Addendum)
Advanced Heart Failure Rounding Note  PCP-Cardiologist: Dr Claiborne Billings   Subjective:    Admitted for a/c CHF. Echo EF < 20% , RV severely reduced. LA/RA severely dilated. Frequent PVCs on tele. Now on amio and mexilitene.   Cath 1/10 with mild CAD. RHC on milrinone 0.25. Placed back on lasix drip after cath.   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   Loop at distal end of PICC line yesterday. Resolved with repositioning last night.  Co-ox 52% on 0.25 milrinone.  Diuresing with lasix gtt at 15/hr + 2.5 mg metolazone.  CVP 3.   Down 12 lb overnight and 47 lb from admit. Brisk diuresis yesterday.   BP soft  Ambulated halls yesterday. Fatigued this morning. Did not sleep much last night. No dyspnea or orthopnea at rest. LE edema improving.   Objective:   Weight Range: 81.5 kg Body mass index is 23.06 kg/m.   Vital Signs:   Temp:  [97.7 F (36.5 C)-98.1 F (36.7 C)] 97.9 F (36.6 C) (01/13 0337) Pulse Rate:  [80] 80 (01/12 1103) Resp:  [15-19] 15 (01/13 0337) BP: (88-99)/(57-64) 90/57 (01/13 0337) SpO2:  [94 %-99 %] 94 % (01/13 0337) Weight:  [81.5 kg] 81.5 kg (01/13 0337) Last BM Date: 02/18/21  Weight change: Filed Weights   02/20/21 0525 02/21/21 0335 02/22/21 0337  Weight: 89.4 kg 86.7 kg 81.5 kg    Intake/Output:   Intake/Output Summary (Last 24 hours) at 02/22/2021 0652 Last data filed at 02/22/2021 0511 Gross per 24 hour  Intake 1319.65 ml  Output 6250 ml  Net -4930.35 ml      Physical Exam  CVP 3 General:  No distress. Lying comfortably in bed. HEENT: normal Neck: supple. no JVD. Carotids 2+ bilat; no bruits.  No lymphadenopathy or thryomegaly appreciated. Cor: PMI nondisplaced. Regular rate & rhythm with ectopy. No rubs, gallops or murmurs. Lungs: clear, O2 sats stable. Abdomen: soft, nontender, nondistended. No hepatosplenomegaly. No bruits or masses. Good bowel sounds. Extremities: no cyanosis, clubbing, rash, trace edema, b/l UNNA, RUE PICC Neuro: alert & orientedx3, cranial nerves grossly intact. moves all 4 extremities w/o difficulty. Affect pleasant    Telemetry  SR 80s with variable PVCs (2-12/min)  Labs    CBC Recent Labs    02/21/21 0535 02/22/21 0500  WBC 8.0 7.9  HGB 12.1* 12.8*  HCT 36.1* 38.0*  MCV 88.3 87.2  PLT 131* [id number]    Basic Metabolic Panel Recent Labs    02/21/21 0535 02/22/21 0500  NA 129* 129*  K 3.8 3.6  CL 90* 84*  CO2 30 33*  GLUCOSE 106* 97  BUN 7* 9  CREATININE 1.26* 1.31*  CALCIUM 8.5* 9.0  MG 2.1 2.1   Liver Function Tests No results for input(s): AST, ALT, ALKPHOS, BILITOT, PROT, ALBUMIN in the last 72 hours. No results for input(s): LIPASE, AMYLASE in the last 72 hours. Cardiac Enzymes No results for input(s): CKTOTAL, CKMB, CKMBINDEX, TROPONINI in the last 72 hours.  BNP: BNP (last 3 results) Recent Labs    02/01/21 1522 02/13/21 1709  BNP 2,411.9* 1,831.1*  ProBNP (last 3 results) No results for input(s): PROBNP in the last 8760 hours.   D-Dimer No results for input(s): DDIMER in the last 72 hours. Hemoglobin A1C No results for input(s): HGBA1C in the last 72 hours. Fasting Lipid Panel No results for input(s): CHOL, HDL, LDLCALC, TRIG, CHOLHDL, LDLDIRECT in the last 72 hours. Thyroid Function Tests No results for input(s): TSH, T4TOTAL, T3FREE, THYROIDAB in the last 72 hours.  Invalid input(s): FREET3  Other results:   Imaging    DG CHEST PORT 1 VIEW  Result Date: 02/21/2021 CLINICAL DATA:  Heart failure EXAM: PORTABLE CHEST 1 VIEW COMPARISON:  02/21/2021 FINDINGS: Right PICC line is unchanged. Heart is  borderline in size. Bilateral lower lobe airspace opacities are again noted, right greater than left with small right effusion. No change since earlier study. IMPRESSION: Bilateral lower lobe opacities, right greater than left with right effusion. No change. Electronically Signed   By: Rolm Baptise M.D.   On: 02/21/2021 23:26   DG CHEST PORT 1 VIEW  Result Date: 02/21/2021 CLINICAL DATA:  Heart failure EXAM: PORTABLE CHEST 1 VIEW COMPARISON:  02/13/2021 FINDINGS: Right PICC line in place with the tip in the SVC. Heart is borderline in size. Small right pleural effusion. Bilateral lower lobe airspace opacities, right greater than left. No acute bony abnormality. IMPRESSION: Bilateral lower lobe airspace opacities, right greater than left could reflect asymmetric edema or infection. This is increased slightly since prior study. Small right effusion. Electronically Signed   By: Rolm Baptise M.D.   On: 02/21/2021 21:46     Medications:     Scheduled Medications:  alteplase  2 mg Intracatheter Once   Chlorhexidine Gluconate Cloth  6 each Topical Daily   digoxin  0.125 mg Oral Daily   docusate sodium  200 mg Oral BID   enoxaparin (LOVENOX) injection  40 mg Subcutaneous Q24H   losartan  12.5 mg Oral BID   metolazone  2.5 mg Oral Daily   mexiletine  200 mg Oral Q12H   pneumococcal 23 valent vaccine  0.5 mL Intramuscular Tomorrow-1000   polyethylene glycol  17 g Oral Daily   potassium chloride  40 mEq Oral TID   senna  1 tablet Oral Daily   sodium chloride flush  3 mL Intravenous Q12H   sodium chloride flush  3 mL Intravenous Q12H   spironolactone  25 mg Oral Daily    Infusions:  sodium chloride     amiodarone 30 mg/hr (02/22/21 0511)   furosemide (LASIX) 200 mg in dextrose 5% 100 mL (2mg /mL) infusion 15.2 mg/hr (02/22/21 0438)   milrinone 0.25 mcg/kg/min (02/22/21 0445)    PRN Medications: sodium chloride, acetaminophen, albuterol, ondansetron (ZOFRAN) IV, sodium chloride  flush    Patient Profile   Erik Chan is a 66 y.o. male with tobacco abuse and COPD. Admitted with new biventricular HFrEF with low output.   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 Moderate to severe MR.  - Cath 1/10 moderate non-obstructive CAD. EF < 10% - ? PVC induced. > 20% PVC burden  on admit - CMRI with severe biventricular heart failure with LGE basal septal wall scar pattern, apical inferior subendocardial LGE, and RV insertion non specific scar pattern. Possible sarcoid. CT chest today. Myeloma panel and UPEP pending. -Continue milrinone 0.25. CO-OX 52% this am. Recheck.  - CVP 3. Diuresed 47 lb this admit, 12 lb overnight. Stop lasix gtt and metolazone.  -  Start po furosemide 40 mg BID - Continue spiro 25 mg daily.  - Continue digoxin 0.125 - Continue  losartan 12.5 bid - No ? blocker w/ low output  - No room to titrate GDMT further. SBP 90s. - Continue Unna boots.  - Renal function stable.   2. PVCs/NSVT - High PVC burden 20% on admit   - Continues to have high PVC burden/NSVT. - Continue amio gtt + Mexilitene - keep K > 4.0 and Mg > 2.0 - needs outpatient sleep study to r/o OSA - Will need Life Vest at d/c  - Supp K. Mag okay.   3. COPD - Former heavy smoker. Quit smoking 5 weeks ago  Encouraged ambulation   Length of Stay: 8  FINCH, LINDSAY N, PA-C  02/22/2021, 6:52 AM  Advanced Heart Failure Team Pager [phone] (M-F; 7a - 5p)  Please contact [facility] for night-coverage after hours (5p -7a ) and weekends on [email or web address]  Patient seen and examined with the above-signed Advanced Practice Provider and/or Housestaff. I personally reviewed laboratory data, imaging studies and relevant notes. I independently examined the patient and formulated the important aspects of the plan. I have edited the note to reflect any of my changes or salient points. I have personally discussed  the plan with the patient and/or family.  Remains on milrinone and IV lasix. Diuresed almost 50 pounds total.   Chest CT no evidence of sarcoid. Underwent R thora today with 1200cc out.   PVC burden down to < 5/min   Denies SOB, orthopnea or PND.   SBP in 70s this evening  CVP 2-3  General:  Weak appearing. No resp difficulty HEENT: normal Neck: supple. no JVD. Carotids 2+ bilat; no bruits. No lymphadenopathy or thryomegaly appreciated. Cor: PMI laterally displaced. Regular rate & rhythm. No rubs, gallops or murmurs. Lungs: clear Abdomen: soft, nontender, nondistended. No hepatosplenomegaly. No bruits or masses. Good bowel sounds. Extremities: no cyanosis, clubbing, rash, edema Neuro: alert & orientedx3, cranial nerves grossly intact. moves all 4 extremities w/o difficulty. Affect pleasant  Remains very tenuous.   CT no evidence sarcoid.   BP down. Stop lasix and metolazone. Cut milrinone to 0.125. Will give NS 500.   Follow co-ox. Will need Lifevest on d/c.   [name], MD  6:38 PM

## 2021-02-22 NOTE — Progress Notes (Signed)
Pt declined stating he has already walked today. Not interested in another walk. Ethelda Chick CES, ACSM 2:20 PM 02/22/2021

## 2021-02-23 DIAGNOSIS — I5043 Acute on chronic combined systolic (congestive) and diastolic (congestive) heart failure: Secondary | ICD-10-CM

## 2021-02-23 LAB — BASIC METABOLIC PANEL
Anion gap: 13 (ref 5–15)
BUN: 10 mg/dL (ref 8–23)
CO2: 29 mmol/L (ref 22–32)
Calcium: 8.6 mg/dL — ABNORMAL LOW (ref 8.9–10.3)
Chloride: 88 mmol/L — ABNORMAL LOW (ref 98–111)
Creatinine, Ser: 1.27 mg/dL — ABNORMAL HIGH (ref 0.61–1.24)
GFR, Estimated: >60 mL/min (ref >60)
Glucose, Bld: 100 mg/dL — ABNORMAL HIGH (ref 70–99)
Potassium: 3.6 mmol/L (ref 3.5–5.1)
Sodium: 130 mmol/L — ABNORMAL LOW (ref 135–145)

## 2021-02-23 LAB — CBC
HCT: 40.1 % (ref 39.0–52.0)
Hemoglobin: 13.8 g/dL (ref 13.0–17.0)
MCH: 29.7 pg (ref 26.0–34.0)
MCHC: 34.4 g/dL (ref 30.0–36.0)
MCV: 86.2 fL (ref 80.0–100.0)
Platelets: 167 10*3/uL (ref 150–400)
RBC: 4.65 MIL/uL (ref 4.22–5.81)
RDW: 16 % — ABNORMAL HIGH (ref 11.5–15.5)
WBC: 8.3 10*3/uL (ref 4.0–10.5)
nRBC: 0 % (ref 0.0–0.2)

## 2021-02-23 LAB — COOXEMETRY PANEL
Carboxyhemoglobin: 1.6 % — ABNORMAL HIGH (ref 0.5–1.5)
Methemoglobin: 0.7 % (ref 0.0–1.5)
O2 Saturation: 70.9 %
Total hemoglobin: 13.7 g/dL (ref 12.0–16.0)

## 2021-02-23 NOTE — Progress Notes (Signed)
CARDIAC REHAB PHASE I   PRE:  Rate/Rhythm: SR/84  BP:  Sitting: 86/58  Standing: 90/62      SaO2: 94 (2 L via N/C)  MODE:  Ambulation: 200 ft   POST:  Rate/Rhythm: SR 89  BP:  Sitting: 93/76      SaO2: 92  Pt received in bed. Assisted patient to stand and urinate. Pt denies dizziness upon standing. Pt ambulated with asstiance x 2 persons. Gait belt used, unsteady gait at times. IV in place and infusing well. 2 L O2 via N/C. Pt does voice some dizziness toward the end of the walk. Pt assisted back to bedside. Pt with call bell and phone in reach.   Lorin Picket, MS, ACSM EP-C, [facility] 02/23/2021 12:20-1315

## 2021-02-23 NOTE — Progress Notes (Signed)
Patient ID: Erik Chan, male   DOB: 11/12/1955, 66 y.o.   MRN: [id number]     Advanced Heart Failure Rounding Note  PCP-Cardiologist: Dr Tresa Endo   Subjective:    Admitted for a/c CHF. Echo EF < 20% , RV severely reduced. LA/RA severely dilated. Frequent PVCs on tele. Now on amiodarone and mexilitene.   Cath 1/10 with mild CAD. RHC on milrinone 0.25. Placed back on lasix drip after cath.   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  - CT chest w/o evidence for sarcoidosis  Co-ox 76% on 0.125 milrinone.  He is off diuretics, CVP 4 today.   SBP remains 80s-90s.  No lightheadedness. Not short of breath.     Objective:   Weight Range: 80.1 kg Body mass index is 22.67 kg/m.   Vital Signs:   Temp:  [97.3 F (36.3 C)-98.2 F (36.8 C)] 98.2 F (36.8 C) (01/14 0735) Pulse Rate:  [77-86] 79 (01/14 0942) Resp:  [14-25] 18 (01/14 0735) BP: (61-97)/(40-68) 78/48 (01/14 0735) SpO2:  [86 %-98 %] 86 % (01/14 0735) Weight:  [80.1 kg] 80.1 kg (01/14 0517) Last BM Date: 02/18/21  Weight change: Filed Weights   02/21/21 0335 02/22/21 0337 02/23/21 0517  Weight: 86.7 kg 81.5 kg 80.1 kg    Intake/Output:   Intake/Output Summary (Last 24 hours) at 02/23/2021 1030 Last data filed at 02/23/2021 1026 Gross per 24 hour  Intake 2473.52 ml  Output 1150 ml  Net 1323.52 ml      Physical Exam   CVP 4 General: NAD Neck: No JVD, no thyromegaly or thyroid nodule.  Lungs: Clear to auscultation bilaterally with normal respiratory effort. CV: Lateral PMI.  Heart regular S1/S2, no S3/S4, no murmur.  No peripheral edema.    Abdomen: Soft, nontender, no hepatosplenomegaly, no distention.  Skin: Intact without lesions or rashes.  Neurologic: Alert and oriented x 3.  Psych: Normal affect. Extremities: No clubbing or cyanosis.  HEENT: Normal.   Telemetry   SR 80s with PVCs  Labs    CBC Recent Labs    02/22/21 0500 02/23/21 0555  WBC 7.9 8.3  HGB 12.8* 13.8  HCT 38.0* 40.1  MCV 87.2 86.2  PLT 154 167    Basic Metabolic Panel Recent Labs    08/03/74 0535 02/22/21 0500 02/23/21 0555  NA 129* 129* 130*  K 3.8 3.6 3.6  CL 90* 84* 88*  CO2 30 33* 29  GLUCOSE 106* 97 100*  BUN 7* 9 10  CREATININE 1.26* 1.31* 1.27*  CALCIUM 8.5* 9.0 8.6*  MG 2.1 2.1  --    Liver Function Tests Recent Labs    02/22/21 2012  PROT 5.9*   No results for input(s): LIPASE, AMYLASE in the last 72 hours. Cardiac Enzymes No results for input(s): CKTOTAL, CKMB, CKMBINDEX, TROPONINI in the last 72 hours.  BNP: BNP (last 3 results) Recent Labs    02/01/21 1522 02/13/21 1709  BNP 2,411.9* 1,831.1*    ProBNP (last 3 results) No results for input(s): PROBNP in the last 8760 hours.   D-Dimer No results  for input(s): DDIMER in the last 72 hours. Hemoglobin A1C No results for input(s): HGBA1C in the last 72 hours. Fasting Lipid Panel Recent Labs    02/22/21 2012  CHOL 142   Thyroid Function Tests No results for input(s): TSH, T4TOTAL, T3FREE, THYROIDAB in the last 72 hours.  Invalid input(s): FREET3  Other results:   Imaging    DG Chest Port 1 View  Result Date: 02/22/2021 CLINICAL DATA:  Follow-up right pleural effusion. EXAM: PORTABLE CHEST 1 VIEW COMPARISON:  02/21/2021 FINDINGS: Stable cardiomegaly. Right arm PICC line remains in appropriate position. Previously seen small right pleural effusion and right basilar infiltrate or atelectasis have resolved since previous study. Left lung remains clear. No pneumothorax visualized. IMPRESSION: Resolution of small right pleural effusion, and right  basilar infiltrate or atelectasis. No acute findings. Electronically Signed   By: Marlaine Hind M.D.   On: 02/22/2021 19:20     Medications:     Scheduled Medications:  alteplase  2 mg Intracatheter Once   Chlorhexidine Gluconate Cloth  6 each Topical Daily   digoxin  0.125 mg Oral Daily   docusate sodium  200 mg Oral BID   enoxaparin (LOVENOX) injection  40 mg Subcutaneous Q24H   losartan  12.5 mg Oral Daily   mexiletine  200 mg Oral Q12H   midodrine  2.5 mg Oral TID WC   pneumococcal 23 valent vaccine  0.5 mL Intramuscular Tomorrow-1000   polyethylene glycol  17 g Oral Daily   senna  1 tablet Oral Daily   sodium chloride flush  3 mL Intravenous Q12H   sodium chloride flush  3 mL Intravenous Q12H   spironolactone  25 mg Oral Daily    Infusions:  sodium chloride     amiodarone 30 mg/hr (02/23/21 0537)    PRN Medications: sodium chloride, acetaminophen, albuterol, ondansetron (ZOFRAN) IV, sodium chloride flush    Patient Profile   Erik Chan is a 66 y.o. male with tobacco abuse and COPD. Admitted with new biventricular HFrEF with low output.   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 Moderate to severe MR.  - Cath 1/10 moderate non-obstructive CAD. EF < 10% - ? PVC induced. > 20% PVC burden  on admit - CMRI with severe biventricular heart failure with basal septal wall mid-wall LGE, apical inferior subendocardial LGE, and RV insertion non specific scar pattern. CT chest without evidence for pulmonary sarcoidosis.  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 Myeloma panel and UPEP pending. Would consider cardiac PET as well as genetic testing as outpatient, will send ACE level.  - Co-ox 71%, stop milrinone today.   - CVP 4. Hold diuretics today, reassess for need tomorrow.   - Continue spiro 25 mg daily.  - Continue digoxin 0.125 - Continue   losartan 12.5 daily.  - He is also on midodrine 2.5 tid with SBP 80s-90s, no BP room to titrate meds and probably will need to continue midodrine for now.  - No ? blocker w/ low output  - Renal function stable.   2. PVCs/NSVT - High PVC burden 20% on admit   - Continues to have high PVC burden/NSVT. - Continue amio gtt + Mexiletine - keep K > 4.0 and Mg > 2.0 - needs outpatient sleep study to r/o OSA - Will need Lifevest at d/c  - Supp K. Mag okay.   3. COPD - Former heavy smoker. Quit  smoking 5 weeks ago  Walk in halls  Loralie Champagne, MD  10:30 AM 02/23/2021

## 2021-02-23 NOTE — Progress Notes (Signed)
Thick white coating seen on tongue. Patient denies burning or pain. Will discuss with care team to determine if it warrants treatment.  Oliver Barre, RN

## 2021-02-24 DIAGNOSIS — I5043 Acute on chronic combined systolic (congestive) and diastolic (congestive) heart failure: Secondary | ICD-10-CM | POA: Diagnosis not present

## 2021-02-24 LAB — BASIC METABOLIC PANEL
Anion gap: 13 (ref 5–15)
BUN: 13 mg/dL (ref 8–23)
CO2: 28 mmol/L (ref 22–32)
Calcium: 8.5 mg/dL — ABNORMAL LOW (ref 8.9–10.3)
Chloride: 88 mmol/L — ABNORMAL LOW (ref 98–111)
Creatinine, Ser: 1.1 mg/dL (ref 0.61–1.24)
GFR, Estimated: >60 mL/min (ref >60)
Glucose, Bld: 96 mg/dL (ref 70–99)
Potassium: 3.4 mmol/L — ABNORMAL LOW (ref 3.5–5.1)
Sodium: 129 mmol/L — ABNORMAL LOW (ref 135–145)

## 2021-02-24 LAB — COOXEMETRY PANEL
Carboxyhemoglobin: 1.5 % (ref 0.5–1.5)
Methemoglobin: 0.8 % (ref 0.0–1.5)
O2 Saturation: 57.5 %
Total hemoglobin: 13.3 g/dL (ref 12.0–16.0)

## 2021-02-24 MED ORDER — AMIODARONE HCL 200 MG PO TABS
200.0000 mg | ORAL_TABLET | Freq: Two times a day (BID) | ORAL | Status: DC
  Administered 2021-02-24 – 2021-02-26 (×5): 200 mg via ORAL
  Filled 2021-02-24 (×5): qty 1

## 2021-02-24 NOTE — Plan of Care (Signed)
Problem: Skin Integrity: Goal: Risk for impaired skin integrity will decrease Outcome: Completed/Met   Problem: Safety: Goal: Ability to remain free from injury will improve Outcome: Completed/Met   Problem: Pain Managment: Goal: General experience of comfort will improve Outcome: Completed/Met   Problem: Nutrition: Goal: Adequate nutrition will be maintained Outcome: Completed/Met   Problem: Clinical Measurements: Goal: Will remain free from infection Outcome: Completed/Met

## 2021-02-24 NOTE — Progress Notes (Signed)
Patient ID: Hideo Tobiason, male   DOB: 1956/01/07, 66 y.o.   MRN: [id number]     Advanced Heart Failure Rounding Note  PCP-Cardiologist: Dr Claiborne Billings   Subjective:    Admitted for a/c CHF. Echo EF < 20% , RV severely reduced. LA/RA severely dilated. Frequent PVCs on tele. Now on amiodarone and mexilitene.   Cath 1/10 with mild CAD. RHC on milrinone 0.25. Placed back on lasix drip after cath.   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  - CT chest w/o evidence for sarcoidosis  Co-ox 58% off milrinone.   He is off diuretics, CVP 6 today.   SBP remains 80s-90s.  No lightheadedness. Not short of breath.  Walked in hall yesterday.    Objective:   Weight Range: 80 kg Body mass index is 22.65 kg/m.   Vital Signs:   Temp:  [97.6 F (36.4 C)-98.1 F (36.7 C)] 98.1 F (36.7 C) (01/15 0755) Pulse Rate:  [73-81] 75 (01/15 0400) Resp:  [17-19] 18 (01/15 0400) BP: (82-93)/(54-64) 90/63 (01/15 0400) SpO2:  [92 %-98 %] 98 % (01/15 0400) Weight:  [80 kg] 80 kg (01/15 0400) Last BM Date: 02/18/21 (pt is taking BM meds)  Weight change: Filed Weights   02/22/21 0337 02/23/21 0517 02/24/21 0400  Weight: 81.5 kg 80.1 kg 80 kg    Intake/Output:   Intake/Output Summary (Last 24 hours) at 02/24/2021 1018 Last data filed at 02/24/2021 0500 Gross per 24 hour  Intake 738.87 ml  Output 425 ml  Net 313.87 ml      Physical Exam   CVP 6 General: NAD Neck: No JVD, no thyromegaly or thyroid nodule.  Lungs: Clear to auscultation bilaterally with normal respiratory effort. CV: Nondisplaced PMI.  Heart regular S1/S2, no  S3/S4, no murmur.  No peripheral edema.   Abdomen: Soft, nontender, no hepatosplenomegaly, no distention.  Skin: Intact without lesions or rashes.  Neurologic: Alert and oriented x 3.  Psych: Normal affect. Extremities: No clubbing or cyanosis.  HEENT: Normal.    Telemetry   SR 80s with PVCs (personally reviewed)  Labs    CBC Recent Labs    02/22/21 0500 02/23/21 0555  WBC 7.9 8.3  HGB 12.8* 13.8  HCT 38.0* 40.1  MCV 87.2 86.2  PLT 154 [id number]    Basic Metabolic Panel Recent Labs    02/22/21 0500 02/23/21 0555 02/24/21 0604  NA 129* 130* 129*  K 3.6 3.6 3.4*  CL 84* 88* 88*  CO2 33* 29 28  GLUCOSE 97 100* 96  BUN 9 10 13   CREATININE 1.31* 1.27* 1.10  CALCIUM 9.0 8.6* 8.5*  MG 2.1  --   --    Liver Function Tests Recent Labs    02/22/21 2012  PROT 5.9*   No results for input(s): LIPASE, AMYLASE in the last 72 hours. Cardiac Enzymes No results for input(s): CKTOTAL, CKMB, CKMBINDEX, TROPONINI in the last 72 hours.  BNP: BNP (last 3 results) Recent Labs    02/01/21 1522 02/13/21 1709  BNP 2,411.9* 1,831.1*    ProBNP (last 3 results) No  results for input(s): PROBNP in the last 8760 hours.   D-Dimer No results for input(s): DDIMER in the last 72 hours. Hemoglobin A1C No results for input(s): HGBA1C in the last 72 hours. Fasting Lipid Panel Recent Labs    02/22/21 2012  CHOL 142   Thyroid Function Tests No results for input(s): TSH, T4TOTAL, T3FREE, THYROIDAB in the last 72 hours.  Invalid input(s): FREET3  Other results:   Imaging    No results found.   Medications:     Scheduled Medications:  alteplase  2 mg Intracatheter Once   amiodarone  200 mg Oral BID   Chlorhexidine Gluconate Cloth  6 each Topical Daily   digoxin  0.125 mg Oral Daily   docusate sodium  200 mg Oral BID   enoxaparin (LOVENOX) injection  40 mg Subcutaneous Q24H   losartan  12.5 mg Oral Daily   mexiletine  200 mg Oral Q12H   midodrine  2.5 mg Oral TID WC    pneumococcal 23 valent vaccine  0.5 mL Intramuscular Tomorrow-1000   polyethylene glycol  17 g Oral Daily   senna  1 tablet Oral Daily   sodium chloride flush  3 mL Intravenous Q12H   sodium chloride flush  3 mL Intravenous Q12H   spironolactone  25 mg Oral Daily    Infusions:  sodium chloride      PRN Medications: sodium chloride, acetaminophen, albuterol, ondansetron (ZOFRAN) IV, sodium chloride flush    Patient Profile   Dustine Weisner is a 66 y.o. male with tobacco abuse and COPD. Admitted with new biventricular HFrEF with low output.   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 Moderate to severe MR.  - Cath 1/10 moderate non-obstructive CAD. EF < 10% - ? PVC induced. > 20% PVC burden  on admit - CMRI with severe biventricular heart failure with basal septal wall mid-wall LGE, apical inferior subendocardial LGE, and RV insertion non specific scar pattern. CT chest without evidence for pulmonary sarcoidosis.  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 Myeloma panel and UPEP pending. Would consider cardiac PET as well as genetic testing as outpatient, will send ACE level.  - Co-ox 58% off milrinone.   - CVP 6. Hold diuretics today, probably start po Lasix tomorrow.   - Continue spiro 25 mg daily.  - Continue digoxin 0.125 - Continue  losartan 12.5 daily.  - He is also on midodrine 2.5 tid with SBP 80s-90s, no BP room to titrate meds and probably will need to continue midodrine for now.  - No ? blocker w/ low output  - Renal function stable, creatinine 1.10.   2. PVCs/NSVT - High PVC burden 20% on admit   - Continues to have high PVC burden/NSVT. - Continue Mexiletine - Transition amiodarone to po today.  - keep K > 4.0 and Mg > 2.0 - needs outpatient sleep study to r/o OSA - Will need Lifevest at d/c  - Supp K. Mag okay.   3. COPD - Former  heavy smoker. Quit smoking 5 weeks ago  Walk in halls.  If remains stable, home Monday or Tuesday.   Loralie Champagne, MD  10:18 AM 02/24/2021

## 2021-02-25 ENCOUNTER — Other Ambulatory Visit (HOSPITAL_COMMUNITY): Payer: Self-pay

## 2021-02-25 LAB — MULTIPLE MYELOMA PANEL, SERUM
Albumin SerPl Elph-Mcnc: 3 g/dL (ref 2.9–4.4)
Albumin/Glob SerPl: 1.1 (ref 0.7–1.7)
Alpha 1: 0.3 g/dL (ref 0.0–0.4)
Alpha2 Glob SerPl Elph-Mcnc: 0.6 g/dL (ref 0.4–1.0)
B-Globulin SerPl Elph-Mcnc: 0.9 g/dL (ref 0.7–1.3)
Gamma Glob SerPl Elph-Mcnc: 1.1 g/dL (ref 0.4–1.8)
Globulin, Total: 2.9 g/dL (ref 2.2–3.9)
IgA: 312 mg/dL (ref 61–437)
IgG (Immunoglobin G), Serum: 1124 mg/dL (ref 603–1613)
IgM (Immunoglobulin M), Srm: 52 mg/dL (ref 20–172)
Total Protein ELP: 5.9 g/dL — ABNORMAL LOW (ref 6.0–8.5)

## 2021-02-25 LAB — BASIC METABOLIC PANEL
Anion gap: 10 (ref 5–15)
BUN: 11 mg/dL (ref 8–23)
CO2: 28 mmol/L (ref 22–32)
Calcium: 8.4 mg/dL — ABNORMAL LOW (ref 8.9–10.3)
Chloride: 91 mmol/L — ABNORMAL LOW (ref 98–111)
Creatinine, Ser: 1.05 mg/dL (ref 0.61–1.24)
GFR, Estimated: >60 mL/min (ref >60)
Glucose, Bld: 80 mg/dL (ref 70–99)
Potassium: 3.4 mmol/L — ABNORMAL LOW (ref 3.5–5.1)
Sodium: 129 mmol/L — ABNORMAL LOW (ref 135–145)

## 2021-02-25 LAB — COOXEMETRY PANEL
Carboxyhemoglobin: 2 % — ABNORMAL HIGH (ref 0.5–1.5)
Methemoglobin: 0.6 % (ref 0.0–1.5)
O2 Saturation: 64.8 %
Total hemoglobin: 13.3 g/dL (ref 12.0–16.0)

## 2021-02-25 LAB — UPEP/UIFE/LIGHT CHAINS/TP, 24-HR UR
% BETA, Urine: 0 %
ALPHA 1 URINE: 0 %
Albumin, U: 100 %
Alpha 2, Urine: 0 %
Free Kappa Lt Chains,Ur: 2.97 mg/L (ref 1.17–86.46)
Free Kappa/Lambda Ratio: >4.43 (ref 1.83–14.26)
Free Lambda Lt Chains,Ur: <0.67 mg/L (ref 0.27–15.21)
GAMMA GLOBULIN URINE: 0 %
Total Protein, Urine-Ur/day: 312 mg/24 hr — ABNORMAL HIGH (ref 30–150)
Total Protein, Urine: 5.2 mg/dL
Total Volume: 6000

## 2021-02-25 LAB — HEMOGLOBIN A1C
Hgb A1c MFr Bld: 6.7 % — ABNORMAL HIGH (ref 4.8–5.6)
Mean Plasma Glucose: 145.59 mg/dL

## 2021-02-25 LAB — ANGIOTENSIN CONVERTING ENZYME: Angiotensin-Converting Enzyme: 37 U/L (ref 14–82)

## 2021-02-25 LAB — MAGNESIUM: Magnesium: 2.2 mg/dL (ref 1.7–2.4)

## 2021-02-25 MED ORDER — DIGOXIN 125 MCG PO TABS
0.1250 mg | ORAL_TABLET | Freq: Every day | ORAL | 6 refills | Status: DC
  Filled 2021-02-25: qty 30, 30d supply, fill #0

## 2021-02-25 MED ORDER — MIDODRINE HCL 2.5 MG PO TABS
2.5000 mg | ORAL_TABLET | Freq: Three times a day (TID) | ORAL | 6 refills | Status: DC
  Filled 2021-02-25: qty 90, 30d supply, fill #0

## 2021-02-25 MED ORDER — POTASSIUM CHLORIDE CRYS ER 10 MEQ PO TBCR
40.0000 meq | EXTENDED_RELEASE_TABLET | Freq: Once | ORAL | Status: AC
  Administered 2021-02-25: 40 meq via ORAL

## 2021-02-25 MED ORDER — MEXILETINE HCL 200 MG PO CAPS
200.0000 mg | ORAL_CAPSULE | Freq: Two times a day (BID) | ORAL | 6 refills | Status: DC
  Filled 2021-02-25: qty 60, 30d supply, fill #0

## 2021-02-25 MED ORDER — POTASSIUM CHLORIDE CRYS ER 20 MEQ PO TBCR
40.0000 meq | EXTENDED_RELEASE_TABLET | Freq: Once | ORAL | Status: AC
  Administered 2021-02-25: 40 meq via ORAL
  Filled 2021-02-25: qty 2

## 2021-02-25 MED ORDER — POTASSIUM CHLORIDE CRYS ER 20 MEQ PO TBCR
20.0000 meq | EXTENDED_RELEASE_TABLET | Freq: Every day | ORAL | 5 refills | Status: DC
  Filled 2021-02-25: qty 30, 30d supply, fill #0

## 2021-02-25 MED ORDER — EMPAGLIFLOZIN 10 MG PO TABS
10.0000 mg | ORAL_TABLET | Freq: Every day | ORAL | Status: DC
  Administered 2021-02-25 – 2021-02-26 (×2): 10 mg via ORAL
  Filled 2021-02-25 (×2): qty 1

## 2021-02-25 MED ORDER — SENNA 8.6 MG PO TABS
1.0000 | ORAL_TABLET | Freq: Two times a day (BID) | ORAL | Status: DC
  Administered 2021-02-25 – 2021-02-26 (×2): 8.6 mg via ORAL
  Filled 2021-02-25 (×2): qty 1

## 2021-02-25 MED ORDER — SPIRONOLACTONE 25 MG PO TABS
25.0000 mg | ORAL_TABLET | Freq: Every day | ORAL | 5 refills | Status: DC
  Filled 2021-02-25: qty 30, 30d supply, fill #0

## 2021-02-25 MED ORDER — AMIODARONE HCL 200 MG PO TABS
200.0000 mg | ORAL_TABLET | Freq: Two times a day (BID) | ORAL | 6 refills | Status: DC
  Filled 2021-02-25: qty 60, 30d supply, fill #0

## 2021-02-25 MED ORDER — EMPAGLIFLOZIN 10 MG PO TABS
10.0000 mg | ORAL_TABLET | Freq: Every day | ORAL | 5 refills | Status: DC
  Filled 2021-02-25: qty 30, 30d supply, fill #0

## 2021-02-25 NOTE — TOC Benefit Eligibility Note (Signed)
Patient Product/process development scientist completed.    The patient is currently admitted and upon discharge could be taking Farxiga 10 mg.  The current 30 day co-pay is, $37.00.   The patient is currently admitted and upon discharge could be taking Jardiance 10 mg.  The current 30 day co-pay is, $37.00.   The patient is insured through [employer] of [place] Medicare Part D     Roland Earl, CPhT Pharmacy Patient Advocate Specialist [facility] Patient Advocate Team Direct Number: [phone]  Fax: [phone]

## 2021-02-25 NOTE — Discharge Summary (Incomplete Revision)
Advanced Heart Failure Team  Discharge Summary   Patient ID: Erik Chan MRN: [id number], DOB/AGE: [birth year] 66 y.o. Admit date: 02/13/2021 D/C date:     02/25/2021   Primary Discharge Diagnoses:  A/C Biventricular HFrEF>>Low Output  PVCs/NSVT COPD  Pleural Effusion, Right  Hypokalemia  Hyponatremia  DM II  Hospital Course:   Erik Chan is a 66 y.o. male with tobacco abuse and COPD, recently seen by Dr. Claiborne Billings for several weeks of shortness of breath.   Admitted with increased shortness of breath in the setting of acute HFrEF. ECHO completed EF < 20%. PICC line placed for CVP and co-ox monitoring. Initial co-ox 52%. Started on milrinone. L/RHC on milrinone 01/10 with mild CAD, RA 15, PA mean 35, PCW 29 (v 35-40), Fick CO/CI 6.3/2.9.  Diuresed with lasix gtt, weight down total of 50 lb during admission. Milrinone weaned off on 01/14. Co-ox stable at 64.8% on day of discharge.  Had greater than 20% PVC burden on admit with runs of NSVT. Initiated on mexiletine and IV amio which was later switched to po 200 mg BID. LifeVest ordered and placed.  cMRI 01/11 EF 10%, basal septal wall LGE (mid-wall), apical subendocardial LGE, RV insertion nonspecific scar pattern, RV normal size with severe dysfunction (EF 19%)  CT chest did not show any evidence of sarcoidosis. There was moderate right pleural effusion. Underwent Rt thoracentesis on 11/13 with removal 1200 cc fluid.  Potential etiologies for CM include isolated cardiac sarcoidosis vs myocarditis vs genetic cardiomyopathy +/- PVCs. Genetic testing and cardiac PET to be considered as an outpatient. ACE level, myeloma panel and UPEP pending day of discharge.   Initiated on GDMT with spiro, digoxin and farxiga. Unable to add entresto d/t soft BP despite midodrine.  Patient evaluated by PT. No discharge needs identified.  HHRN arranged with TOC-CSW  Follow-up scheduled in HF clinic. Will need BMET, CBC and digoxin level same day as  visit.    Acute Biventricular HFrEF>>Low Output  - ECHO severely reduced EF < 20% and severely reduced RV. Moderate to severe MR.  - Cath 1/10 moderate non-obstructive CAD. EF < 10% - ? PVC induced. > 20% PVC burden  on admit - CMRI with severe biventricular heart failure with basal septal wall mid-wall LGE, apical inferior subendocardial LGE, and RV insertion non specific scar pattern. CT chest without evidence for pulmonary sarcoidosis.  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 Myeloma panel and UPEP pending. Would consider cardiac PET as well as genetic testing as outpatient, will send ACE level (pending).  - Co-ox 65% off milrinone.   - CVP 5-6. Holding Lasix  - Continue spiro 25 mg daily.  - Continue digoxin 0.125 - BP too soft for Entresto or ARB - Continue midodrine 2.5 tid with SBP 80s-90s, no BP room to titrate meds  - No ? blocker w/ low output  - Renal function stable, creatinine 1.05.  - Adding farxiga 10 day of d/c. A1c 6.7.   2. PVCs/NSVT - High PVC burden 20% on admit   - Now well suppressed w/ AAD therapy  - Continue Mexiletine - Continue PO amiodarone 200 mg bid  - keep K > 4.0 and Mg > 2.0 - needs outpatient sleep study to r/o OSA - LifeVest placed - K 3.4. Supp'd day of d/c. Mag okay.   3. COPD - Former heavy smoker. Quit smoking 5 weeks ago   4. Pleural Effusion - s/p rt thoracentesis 1/13 w/ 1200 cc fluid  removal  - transudative     5. Hypokalemia - K 3.4 - supp KCl  - continue spiro    6. Hyponatremia  - Na 129  - monitor and fluid restrict   7. DM II -A1c 6.7 -? new -Follow-up with PCP for management -Added Farxiga  Discharge Weight: 174. 8 pounds.  Discharge Vitals: Blood pressure 93/62, pulse 69, temperature 98.4 F (36.9 C), temperature source Oral, resp. rate 20, height 6\' 2"  (1.88 m), weight 79.3 kg, SpO2 95 %.  Labs: Lab Results  Component Value Date   WBC 8.3 02/23/2021   HGB  13.8 02/23/2021   HCT 40.1 02/23/2021   MCV 86.2 02/23/2021   PLT 167 02/23/2021    Recent Labs  Lab 02/22/21 2012 02/23/21 0555 02/25/21 0523  NA  --    < > 129*  K  --    < > 3.4*  CL  --    < > 91*  CO2  --    < > 28  BUN  --    < > 11  CREATININE  --    < > 1.05  CALCIUM  --    < > 8.4*  PROT 5.9*  --   --   GLUCOSE  --    < > 80   < > = values in this interval not displayed.   Lab Results  Component Value Date   CHOL 142 02/22/2021   HDL 35 (L) 02/01/2021   LDLCALC 77 02/01/2021   TRIG 115 02/01/2021   BNP (last 3 results) Recent Labs    02/01/21 1522 02/13/21 1709  BNP 2,411.9* 1,831.1*    ProBNP (last 3 results) No results for input(s): PROBNP in the last 8760 hours.   Diagnostic Studies/Procedures    Cath 1/10 with mild CAD. RHC on milrinone 0.25. Placed back on lasix drip after cath.   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      Discharge Medications   Allergies as of 02/25/2021       Reactions   Shellfish-derived Products Hives, Rash        Medication List     STOP taking these medications    furosemide 40 MG tablet Commonly known as: LASIX   losartan 25 MG tablet Commonly known as: COZAAR   metoprolol succinate 25 MG 24 hr tablet Commonly known as: Toprol XL       TAKE these medications    acetaminophen 500 MG tablet Commonly known as: TYLENOL Take 500 mg by mouth every 6 (six) hours as needed for mild pain, fever or headache.   albuterol 108 (90 Base) MCG/ACT inhaler Commonly known as: VENTOLIN HFA Inhale 1-2 puffs into the lungs every 4 (four) hours as needed for wheezing or  shortness of breath.   amiodarone 200 MG tablet Commonly known as: PACERONE Take 1 tablet (200 mg total) by mouth 2 (two) times daily.   digoxin 0.125 MG tablet Commonly known as: LANOXIN Take 1 tablet (0.125 mg total) by mouth daily. Start taking on: February 26, 2021   empagliflozin 10 MG  Tabs tablet Commonly known as: JARDIANCE Take 1 tablet (10 mg total) by mouth daily.   mexiletine 200 MG capsule Commonly known as: MEXITIL Take 1 capsule (200 mg total) by mouth every 12 (twelve) hours.   midodrine 2.5 MG tablet Commonly known as: PROAMATINE Take 1 tablet (2.5 mg total) by mouth 3 (three) times daily with meals.   potassium chloride SA 20 MEQ tablet Commonly known as: KLOR-CON M Take 1 tablet (20 mEq total) by mouth daily.   spironolactone 25 MG tablet Commonly known as: ALDACTONE Take 1 tablet (25 mg total) by mouth daily. Start taking on: February 26, 2021               Durable Medical Equipment  (From admission, onward)           Start     Ordered   02/25/21 1501  Heart failure home health orders  (Heart failure home health orders / Face to face)  Once       Comments: Heart Failure Follow-up Care:  Verify follow-up appointments per Patient Discharge Instructions. Confirm transportation arranged. Reconcile home medications with discharge medication list. Remove discontinued medications from use. Assist patient/caregiver to manage medications using pill box. Reinforce low sodium food selection Assessments: Vital signs and oxygen saturation at each visit. Assess home environment for safety concerns, caregiver support and availability of low-sodium foods. Consult Education officer, museum, PT/OT, Dietitian, and CNA based on assessments. Perform comprehensive cardiopulmonary assessment. Notify MD for any change in condition or weight gain of 3 pounds in one day or 5 pounds in one week with symptoms. Daily Weights and Symptom Monitoring: Ensure patient has access to  scales. Teach patient/caregiver to weigh daily before breakfast and after voiding using same scale and record.    Teach patient/caregiver to track weight and symptoms and when to notify Provider. Activity: Develop individualized activity plan with patient/caregiver.   Question Answer Comment  Heart Failure Follow-up Care Advanced [facility] Failure (AHF) Clinic at [phone]   Obtain the following labs Basic Metabolic Panel   Lab frequency Weekly   Fax lab results to AHF Clinic at [phone]   Diet Low Sodium Heart Healthy   Fluid restrictions: 1800 mL Fluid      02/25/21 1501   02/25/21 0935  For home use only DME Vest life vest  Once       Comments: EF < 20%, VT, 3 months   02/25/21 0935            Disposition   The patient will be discharged in stable condition to home. Discharge Instructions     (HEART FAILURE PATIENTS) Call MD:  Anytime you have any of the following symptoms: 1) 3 pound weight gain in 24 hours or 5 pounds in 1 week 2) shortness of breath, with or without a dry hacking cough 3) swelling in the hands, feet or stomach 4) if you have to sleep on extra pillows at night in order to breathe.   Complete by: As directed    Diet - low sodium heart healthy   Complete by: As directed    Increase activity slowly   Complete by: As directed        Follow-up Information     [facility] [place] Follow up on 03/04/2021.   Specialty: Cardiology Why: Advanced Heart Failure Clinic at [facility] 12 pm Entrance C, Garage Code 1202 Contact information: [address] [id number] [place] [phone]  Kathyrn Lass, MD Follow up in 1 week(s).   Specialty: Family Medicine Why: Hospital follow-up, new diabetes Call for appointment Contact information: [place] [address] [phone]                   Duration of Discharge Encounter: Greater than 35 minutes    Signed, [facility], Oviya Ammar N  02/25/2021, 3:16 PM

## 2021-02-25 NOTE — Progress Notes (Addendum)
Patient ID: Zymier Matsuura, male   DOB: February 09, 1956, 66 y.o.   MRN: [id number]     Advanced Heart Failure Rounding Note  PCP-Cardiologist: Dr Claiborne Billings   Subjective:    Admitted for a/c CHF. Echo EF < 20% , RV severely reduced. LA/RA severely dilated. Frequent PVCs on tele. Now on amiodarone and mexilitene.   Cath 1/10 with mild CAD. RHC on milrinone 0.25. Placed back on lasix drip after cath.   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  - CT chest w/o evidence for sarcoidosis  1/13 Rt thoracentesis, 1200 cc fluid removal   Co-ox 65% off milrinone.   IV Lasix held yesterday w/ low CVP. Wt down 2 lb. CVP 5-6 today  Overall, feels better. No current dyspnea. Denies CP. PVCs well suppressed w/ mexiletine and amio.      Objective:   Weight Range: 79.3 kg Body mass index is 22.44 kg/m.   Vital Signs:   Temp:  [97.6 F (36.4 C)-98.4 F (36.9 C)] 98.4 F (36.9 C) (01/16 0400) Pulse Rate:  [66-74] 72 (01/16 0800) Resp:  [16-24] 20 (01/16 0800) BP: (87-94)/(50-65) 94/54 (01/16 0800) SpO2:  [95 %-98 %] 95 % (01/16 0800) Weight:  [79.3 kg] 79.3 kg (01/16 0400) Last BM Date: 02/21/21 (per pt)  Weight change: Filed Weights   02/23/21 0517 02/24/21 0400 02/25/21 0400  Weight: 80.1 kg 80 kg 79.3 kg    Intake/Output:   Intake/Output Summary (Last 24 hours) at 02/25/2021 0836 Last data filed at 02/25/2021 0826 Gross per 24 hour  Intake 445.98 ml  Output 700 ml  Net -254.02 ml      Physical Exam   CVP 5-6  General:  Well appearing. No respiratory difficulty HEENT: normal Neck: supple. no JVD. Carotids 2+  bilat; no bruits. No lymphadenopathy or thyromegaly appreciated. Cor: PMI nondisplaced. Regular rate & rhythm. No rubs, gallops or murmurs. Lungs: clear Abdomen: soft, nontender, nondistended. No hepatosplenomegaly. No bruits or masses. Good bowel sounds. Extremities: no cyanosis, clubbing, rash, edema Neuro: alert & oriented x 3, cranial nerves grossly intact. moves all 4 extremities w/o difficulty. Affect pleasant.   Telemetry   NSR 80s. No PVCs (personally reviewed)  Labs    CBC Recent Labs    02/23/21 0555  WBC 8.3  HGB 13.8  HCT 40.1  MCV 86.2  PLT [id number]    Basic Metabolic Panel Recent Labs    02/24/21 0604 02/25/21 0523  NA 129* 129*  K 3.4* 3.4*  CL 88* 91*  CO2 28 28  GLUCOSE 96 80  BUN 13 11  CREATININE 1.10 1.05  CALCIUM 8.5* 8.4*   Liver Function Tests Recent Labs    02/22/21 2012  PROT 5.9*   No results for input(s): LIPASE, AMYLASE in the last 72 hours. Cardiac Enzymes No results for input(s): CKTOTAL, CKMB, CKMBINDEX, TROPONINI in the last 72 hours.  BNP: BNP (last 3 results) Recent Labs    02/01/21 1522 02/13/21 1709  BNP 2,411.9* 1,831.1*    ProBNP (last 3 results) No results for input(s): PROBNP in the last 8760  hours.   D-Dimer No results for input(s): DDIMER in the last 72 hours. Hemoglobin A1C No results for input(s): HGBA1C in the last 72 hours. Fasting Lipid Panel Recent Labs    02/22/21 2012  CHOL 142   Thyroid Function Tests No results for input(s): TSH, T4TOTAL, T3FREE, THYROIDAB in the last 72 hours.  Invalid input(s): FREET3  Other results:   Imaging    No results found.   Medications:     Scheduled Medications:  alteplase  2 mg Intracatheter Once   amiodarone  200 mg Oral BID   Chlorhexidine Gluconate Cloth  6 each Topical Daily   digoxin  0.125 mg Oral Daily   docusate sodium  200 mg Oral BID   enoxaparin (LOVENOX) injection  40 mg Subcutaneous Q24H   losartan  12.5 mg Oral Daily   mexiletine   200 mg Oral Q12H   midodrine  2.5 mg Oral TID WC   pneumococcal 23 valent vaccine  0.5 mL Intramuscular Tomorrow-1000   polyethylene glycol  17 g Oral Daily   senna  1 tablet Oral Daily   sodium chloride flush  3 mL Intravenous Q12H   sodium chloride flush  3 mL Intravenous Q12H   spironolactone  25 mg Oral Daily    Infusions:  sodium chloride      PRN Medications: sodium chloride, acetaminophen, albuterol, ondansetron (ZOFRAN) IV, sodium chloride flush    Patient Profile   Sreekar Manner is a 66 y.o. male with tobacco abuse and COPD. Admitted with new biventricular HFrEF with low output.   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 Moderate to severe MR.  - Cath 1/10 moderate non-obstructive CAD. EF < 10% - ? PVC induced. > 20% PVC burden  on admit - CMRI with severe biventricular heart failure with basal septal wall mid-wall LGE, apical inferior subendocardial LGE, and RV insertion non specific scar pattern. CT chest without evidence for pulmonary sarcoidosis.  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 Myeloma panel and UPEP pending. Would consider cardiac PET as well as genetic testing as outpatient, will send ACE level (pending).  - Co-ox 65% off milrinone.   - CVP 5-6. Hold Lasix  - Continue spiro 25 mg daily.  - Continue digoxin 0.125 - Continue  losartan 12.5 daily. BP too soft for Entresto  - He is also on midodrine 2.5 tid with SBP 80s-90s, no BP room to titrate meds and probably will need to continue midodrine for now.  - No ? blocker w/ low output  - Renal function stable, creatinine 1.05.  - Add SGLT2i  2. PVCs/NSVT - High PVC burden 20% on admit   - Now well suppressed w/ AAD therapy  - Continue Mexiletine - Continue PO amiodarone 200 mg bid  - keep K > 4.0 and Mg > 2.0 - needs outpatient sleep study to r/o OSA - Will need Lifevest at  d/c (order placed)  - Supp K.  Check Mg   3. COPD - Former heavy smoker. Quit smoking 5 weeks ago  4. Pleural Effusion - s/p rt thoracentesis 1/13 w/ 1200 cc fluid removal  - transudative    5. Hypokalemia - K 3.4 - supp KCl  - continue spiro   6. Hyponatremia  - Na 129  - monitor and fluid restrict    Lyda Jester, PA-C  8:36 AM 02/25/2021  Patient seen and examined with the above-signed  Advanced Practice Provider and/or Housestaff. I personally reviewed laboratory data, imaging studies and relevant notes. I independently examined the patient and formulated the important aspects of the plan. I have edited the note to reflect any of my changes or salient points. I have personally discussed the plan with the patient and/or family.  Feels weak but ok. Denies CP, SOB, orthopnea or PND. Co-ox stable off milrinone. On midodrine for BP support. CVP low. PVCs suppressed on amio and mexilitene  General:  Weak appearing. No resp difficulty HEENT: normal Neck: supple. no JVD. Carotids 2+ bilat; no bruits. No lymphadenopathy or thryomegaly appreciated. Cor: PMI laterally displaced. Regular rate & rhythm. No rubs, gallops or murmurs. Lungs: clear Abdomen: soft, nontender, nondistended. No hepatosplenomegaly. No bruits or masses. Good bowel sounds. Extremities: no cyanosis, clubbing, rash, edema Neuro: alert & orientedx3, cranial nerves grossly intact. moves all 4 extremities w/o difficulty. Affect pleasant  Tenuous bur stable. Add SGLT2i. Stop losartan. Ambulate. Will need LifeVest.   If EF not recovering in several months may need advanced therapies.   [name], MD  8:25 PM

## 2021-02-25 NOTE — Progress Notes (Signed)
Mobility Specialist Progress Note   02/25/21 1121  Mobility  Activity Refused mobility   Pt stating to have just received meds and is feeling very fatigued. Would prefer for me to come back around 1p. Will return if time permits.   Frederico Hamman Mobility Specialist Phone Number [phone]

## 2021-02-25 NOTE — Progress Notes (Signed)
Pt to be d/c tomorrow not today

## 2021-02-25 NOTE — Progress Notes (Signed)
Mobility Specialist Progress Note   02/25/21 1310  Mobility  Activity Ambulated in hall  Level of Assistance Standby assist, set-up cues, supervision of patient - no hands on  Assistive Device None  Distance Ambulated (ft) 235 ft  Mobility Ambulated with assistance in hallway  Mobility Response Tolerated well  Mobility performed by Mobility specialist  $Mobility charge 1 Mobility   Received pt in bed on RA having no complaints and agreeable to mobility. Asymptomatic throughout ambulation, returned back to bed w/ call bell in reach and all needs met.  Pre Mobility: 93/62 BP, 93% SpO2 on RA During Mobility: 89% SpO2 on on RA  Post Mobility: 91/54 BP, 94% SpO2 on RA  Holland Falling Mobility Specialist Phone Number [phone]

## 2021-02-25 NOTE — Progress Notes (Signed)
Orthopedic Tech Progress Note Patient Details:  Erik Chan 10/31/1955 [id number]  Ortho Devices Type of Ortho Device: Radio broadcast assistant Ortho Device/Splint Location: BLE Ortho Device/Splint Interventions: Ordered, Application, Adjustment   Post Interventions Patient Tolerated: Well Instructions Provided: Care of device  [name] 02/25/2021, 11:38 AM

## 2021-02-25 NOTE — Discharge Summary (Addendum)
Advanced Heart Failure Team  Discharge Summary   Patient ID: Erik Chan MRN: [id number], DOB/AGE: 09/11/1955 66 y.o. Admit date: 02/13/2021 D/C date:     02/26/2021   Primary Discharge Diagnoses:  A/C Biventricular HFrEF>>Low Output  PVCs/NSVT COPD  Pleural Effusion, Right  Hypokalemia  Hyponatremia  DM II  Hospital Course:   Erik Chan is a 66 y.o. male with tobacco abuse and COPD, recently seen by Dr. Claiborne Billings for several weeks of shortness of breath.   Admitted with increased shortness of breath in the setting of acute HFrEF. ECHO completed EF < 20%. PICC line placed for CVP and co-ox monitoring. Initial co-ox 52%. Started on milrinone. L/RHC on milrinone 01/10 with mild CAD, RA 15, PA mean 35, PCW 29 (v 35-40), Fick CO/CI 6.3/2.9.  Diuresed with lasix gtt, weight down total of 50 lb during admission. Milrinone weaned off on 01/14. Co-ox stable at 64.8% on day of discharge.  Had greater than 20% PVC burden on admit with runs of NSVT. Initiated on mexiletine and IV amio which was later switched to po 200 mg BID. At the time of discharge PVCs suppressed. LifeVest ordered and placed at discharge.   cMRI 01/11 EF 10%, basal septal wall LGE (mid-wall), apical subendocardial LGE, RV insertion nonspecific scar pattern, RV normal size with severe dysfunction (EF 19%)  CT chest did not show any evidence of sarcoidosis. There was moderate right pleural effusion. Underwent Rt thoracentesis on 11/13 with removal 1200 cc fluid.  Potential etiologies for CM include isolated cardiac sarcoidosis vs myocarditis vs genetic cardiomyopathy +/- PVCs. Genetic testing and cardiac PET to be considered as an outpatient. SPEP/UPEP negative.    Initiated on GDMT with spiro, digoxin and jardiance. Unable to add entresto or losartan due to soft  BP. Would not add due while on midodrine.   Patient evaluated by PT. No discharge needs identified.  HHRN arranged with TOC-CSW  Follow-up scheduled in HF  clinic. Will need BMET, CBC and digoxin level same day as visit.    Acute Biventricular HFrEF>>Low Output  - ECHO severely reduced EF < 20% and severely reduced RV. Moderate to severe MR.  - Cath 1/10 moderate non-obstructive CAD. EF < 10% - ? PVC induced. > 20% PVC burden  on admit - CMRI with severe biventricular heart failure with basal septal wall mid-wall LGE, apical inferior subendocardial LGE, and RV insertion non specific scar pattern. CT chest without evidence for pulmonary sarcoidosis.  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 Myeloma panel and UPEP pending. Would consider cardiac PET as well as genetic testing as outpatient, will send ACE level (pending).  - Co-ox 71% off milrinone.   - CVP 5-6. Holding Lasix  - Continue spiro 25 mg daily.  - Continue digoxin 0.125 - BP too soft for Entresto or ARB - Continue midodrine 2.5 tid with SBP 80s-90s, no BP room to titrate meds  - No ? blocker w/ low output  - Renal function stable, creatinine 1.05.  - Continue jardiance 10 mg daily.    2. PVCs/NSVT - High PVC burden 20% on admit   - Now well suppressed w/ AAD therapy  - Continue Mexiletine +amiodarone 200 mg bid  - keep K > 4.0 and Mg > 2.0 - needs outpatient sleep study to r/o OSA  3. COPD - Former heavy smoker. Quit smoking 5 weeks ago   4. Pleural Effusion - s/p rt thoracentesis 1/13 w/ 1200 cc fluid removal  - transudative  5. Hypokalemia -K    6. Hyponatremia  - Na 129  - monitor and fluid restrict   7. DM II -A1c 6.7 -? new -Follow-up with PCP for management -Added jardiance  Discharge Weight: 174. 8 pounds.  Discharge Vitals: Blood pressure (!) 91/54, pulse 76, temperature 97.7 F (36.5 C), temperature source Oral, resp. rate 19, height 6\' 2"  (1.88 m), weight 78.5 kg, SpO2 96 %.  Labs: Lab Results  Component Value Date   WBC 8.3 02/23/2021   HGB 13.8 02/23/2021   HCT 40.1 02/23/2021   MCV 86.2  02/23/2021   PLT 167 02/23/2021    Recent Labs  Lab 02/22/21 2012 02/23/21 0555 02/26/21 0502  NA  --    < > 131*  K  --    < > 3.9  CL  --    < > 95*  CO2  --    < > 27  BUN  --    < > 8  CREATININE  --    < > 0.99  CALCIUM  --    < > 8.4*  PROT 5.9*  --   --   GLUCOSE  --    < > 76   < > = values in this interval not displayed.   Lab Results  Component Value Date   CHOL 142 02/22/2021   HDL 35 (L) 02/01/2021   LDLCALC 77 02/01/2021   TRIG 115 02/01/2021   BNP (last 3 results) Recent Labs    02/01/21 1522 02/13/21 1709  BNP 2,411.9* 1,831.1*    ProBNP (last 3 results) No results for input(s): PROBNP in the last 8760 hours.   Diagnostic Studies/Procedures    Cath 1/10 with mild CAD. RHC on milrinone 0.25. Placed back on lasix drip after cath.   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      Discharge Medications   Allergies as of 02/26/2021       Reactions   Shellfish-derived Products Hives, Rash        Medication List     STOP taking these medications    furosemide 40 MG tablet Commonly known as: LASIX   losartan 25 MG tablet Commonly known as: COZAAR   metoprolol succinate 25 MG 24 hr tablet Commonly known as: Toprol XL       TAKE these medications    acetaminophen 500 MG tablet Commonly known as: TYLENOL Take 500 mg by mouth every 6 (six) hours as needed for mild pain, fever or headache.   albuterol 108 (90 Base) MCG/ACT inhaler Commonly known as: VENTOLIN HFA Inhale 1-2 puffs into the lungs every 4 (four) hours as needed for wheezing or shortness of breath.   amiodarone 200 MG tablet Commonly  known as: PACERONE Take 1 tablet (200 mg total) by mouth 2 (two) times daily.   digoxin 0.125 MG tablet Commonly known as: LANOXIN Take 1 tablet (0.125 mg total) by mouth daily.   Jardiance 10 MG Tabs tablet Generic drug: empagliflozin Take 1 tablet (10 mg total) by mouth daily.   mexiletine 200 MG capsule Commonly known  as: MEXITIL Take 1 capsule (200 mg total) by mouth every 12 (twelve) hours.   midodrine 2.5 MG tablet Commonly known as: PROAMATINE Take 1 tablet (2.5 mg total) by mouth 3 (three) times daily with meals.   midodrine 5 MG tablet Commonly known as: PROAMATINE Take 1 tablet (5 mg total) by mouth 3 (three) times daily with meals.   potassium chloride 10 MEQ tablet Commonly known as: KLOR-CON M Take 2 tablets (20 mEq total) by mouth daily.   spironolactone 25 MG tablet Commonly known as: ALDACTONE Take 1 tablet (25 mg total) by mouth daily.               Durable Medical Equipment  (From admission, onward)           Start     Ordered   02/26/21 1151  For home use only DME 4 wheeled rolling walker with seat  Once       Question:  Patient needs a walker to treat with the following condition  Answer:  Heart failure ([place])   02/26/21 1150   02/25/21 1501  Heart failure home health orders  (Heart failure home health orders / Face to face)  Once       Comments: Heart Failure Follow-up Care:  Verify follow-up appointments per Patient Discharge Instructions. Confirm transportation arranged. Reconcile home medications with discharge medication list. Remove discontinued medications from use. Assist patient/caregiver to manage medications using pill box. Reinforce low sodium food selection Assessments: Vital signs and oxygen saturation at each visit. Assess home environment for safety concerns, caregiver support and availability of low-sodium foods. Consult Education officer, museum, PT/OT, Dietitian, and CNA based on assessments. Perform comprehensive cardiopulmonary  assessment. Notify MD for any change in condition or weight gain of 3 pounds in one day or 5 pounds in one week with symptoms. Daily Weights and Symptom Monitoring: Ensure patient has access to scales. Teach patient/caregiver to weigh daily before breakfast and after voiding using same scale and record.    Teach patient/caregiver to track weight and symptoms and when to notify Provider. Activity: Develop individualized activity plan with patient/caregiver.   Question Answer Comment  Heart Failure Follow-up Care Advanced Heart Failure (AHF) Clinic at [phone]   Obtain the following labs Basic Metabolic Panel   Lab frequency Weekly   Fax lab results to AHF Clinic at [phone]   Diet Low Sodium Heart Healthy   Fluid restrictions: 1800 mL Fluid      02/25/21 1501   02/25/21 0935  For home use only DME Vest life vest  Once       Comments: EF < 20%, VT, 3 months   02/25/21 0935            Disposition   The patient will be discharged in stable condition to home. Discharge Instructions     (HEART FAILURE PATIENTS) Call MD:  Anytime you have any of the following symptoms: 1) 3 pound weight gain in 24 hours or 5 pounds in 1 week 2) shortness of breath, with or without a dry hacking cough 3) swelling in the hands, feet or stomach 4) if you have to sleep on extra pillows at night in order to breathe.   Complete by: As directed    (HEART FAILURE PATIENTS) Call MD:  Anytime you have any of the following symptoms: 1) 3 pound weight gain in 24 hours or 5 pounds in 1 week 2) shortness of breath, with or without a dry hacking cough 3) swelling in  the hands, feet or stomach 4) if you have to sleep on extra pillows at night in order to breathe.   Complete by: As directed    Amb Referral to Cardiac Rehabilitation   Complete by: As directed    Diagnosis: Heart Failure (see criteria below if ordering Phase II)   Heart Failure Type: Chronic Systolic & Diastolic   After initial evaluation and  assessments completed: Virtual Based Care may be provided alone or in conjunction with Phase 2 Cardiac Rehab based on patient barriers.: Yes   Diet - low sodium heart healthy   Complete by: As directed    Diet - low sodium heart healthy   Complete by: As directed    Increase activity slowly   Complete by: As directed    Increase activity slowly   Complete by: As directed        Follow-up Information     [facility] Follow up on 03/04/2021.   Specialty: Cardiology Why: Advanced Heart Failure Clinic at [facility] 12 pm Entrance C, Garage Code 1202 Contact information: [address] [id number] [place] [address] [place]        Kathyrn Lass, MD Follow up on 03/01/2021.   Specialty: Family Medicine Why: Hospital follow-up, new diabetes @1 :15pm Contact information: [place] [address] [address] Follow up.   Why: Home Health RN -agency will call to arrange appts Contact information: [phone]                  Duration of Discharge Encounter: Greater than 35 minutes   Signed, [name] Clegg  NP-C  02/26/2021, 2:24 PM  Patient seen and examined with the above-signed Advanced Practice Provider and/or Housestaff. I personally reviewed laboratory data, imaging studies and relevant notes. I independently examined the patient and formulated the important aspects of the plan. I have edited the note to reflect any of my changes or salient points. I have personally discussed the plan with the patient and/or family.  Overall improved. Co-ox stable. Volume status much improved. PVCs suppressed. Stable for d/c with close f/u in HF Clinic. If EF not improving may need to consider advanced HF therapies.   [name], MD  10:54 PM

## 2021-02-25 NOTE — TOC CM/SW Note (Signed)
HF TOC CM faxed CMRI and updated Life Vest order with length of use to [employer]. Initial package was faxed on 02/22/2021. Spoke to [facility], and will resubmit referral to insurance for prior auth. [employer] will deliver device to pt's room once approved. Attending updated.   Isidoro Donning RN3 CCM, Heart Failure TOC CM [phone]

## 2021-02-26 ENCOUNTER — Other Ambulatory Visit (HOSPITAL_COMMUNITY): Payer: Self-pay

## 2021-02-26 ENCOUNTER — Other Ambulatory Visit: Payer: Self-pay

## 2021-02-26 LAB — CYTOLOGY - NON PAP

## 2021-02-26 LAB — COOXEMETRY PANEL
Carboxyhemoglobin: 2 % — ABNORMAL HIGH (ref 0.5–1.5)
Methemoglobin: 0.9 % (ref 0.0–1.5)
O2 Saturation: 67.3 %
Total hemoglobin: 13.6 g/dL (ref 12.0–16.0)

## 2021-02-26 LAB — BASIC METABOLIC PANEL
Anion gap: 9 (ref 5–15)
BUN: 8 mg/dL (ref 8–23)
CO2: 27 mmol/L (ref 22–32)
Calcium: 8.4 mg/dL — ABNORMAL LOW (ref 8.9–10.3)
Chloride: 95 mmol/L — ABNORMAL LOW (ref 98–111)
Creatinine, Ser: 0.99 mg/dL (ref 0.61–1.24)
GFR, Estimated: >60 mL/min (ref >60)
Glucose, Bld: 76 mg/dL (ref 70–99)
Potassium: 3.9 mmol/L (ref 3.5–5.1)
Sodium: 131 mmol/L — ABNORMAL LOW (ref 135–145)

## 2021-02-26 MED ORDER — MIDODRINE HCL 5 MG PO TABS
5.0000 mg | ORAL_TABLET | Freq: Three times a day (TID) | ORAL | 6 refills | Status: DC
  Filled 2021-02-26: qty 90, 30d supply, fill #0

## 2021-02-26 MED ORDER — MIDODRINE HCL 5 MG PO TABS
5.0000 mg | ORAL_TABLET | Freq: Three times a day (TID) | ORAL | Status: DC
  Administered 2021-02-26: 5 mg via ORAL
  Filled 2021-02-26: qty 1

## 2021-02-26 MED ORDER — POTASSIUM CHLORIDE CRYS ER 10 MEQ PO TBCR
20.0000 meq | EXTENDED_RELEASE_TABLET | Freq: Every day | ORAL | 5 refills | Status: DC
  Filled 2021-02-26: qty 60, 30d supply, fill #0

## 2021-02-26 MED ORDER — POTASSIUM CHLORIDE CRYS ER 10 MEQ PO TBCR: 20.0000 meq | EXTENDED_RELEASE_TABLET | Freq: Every day | ORAL | 3 refills | Status: DC

## 2021-02-26 MED ORDER — POTASSIUM CHLORIDE CRYS ER 10 MEQ PO TBCR
20.0000 meq | EXTENDED_RELEASE_TABLET | Freq: Once | ORAL | Status: AC
  Administered 2021-02-26: 20 meq via ORAL
  Filled 2021-02-26: qty 1

## 2021-02-26 NOTE — Progress Notes (Signed)
Mobility Specialist Progress Note:   02/26/21 1017  Mobility  Activity Refused mobility   Pt stated he's going home today and doesn't want to over do it.   [facility] Designer, jewellery Phone [phone] Secondary Phone [phone]

## 2021-02-26 NOTE — Progress Notes (Addendum)
Patient ID: Erik Chan, male   DOB: 1955/11/17, 66 y.o.   MRN: [id number]     Advanced Heart Failure Rounding Note  PCP-Cardiologist: Dr Claiborne Billings   Subjective:    Admitted for a/c CHF. Echo EF < 20% , RV severely reduced. LA/RA severely dilated. Frequent PVCs on tele. Now on amiodarone and mexilitene.   Cath 1/10 with mild CAD. RHC on milrinone 0.25. Placed back on lasix drip after cath.   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  - CT chest w/o evidence for sarcoidosis  1/13 Rt thoracentesis, 1200 cc fluid removal   Feels ok. Denies SOB.  Objective:   Weight Range: 78.5 kg Body mass index is 22.21 kg/m.   Vital Signs:   Temp:  [97.5 F (36.4 C)-98.6 F (37 C)] 97.7 F (36.5 C) (01/17 0745) Pulse Rate:  [69-76] 76 (01/17 0745) Resp:  [16-20] 19 (01/17 0745) BP: (92-94)/(56-76) 93/59 (01/17 0745) SpO2:  [95 %-97 %] 96 % (01/17 0745) Weight:  [78.5 kg] 78.5 kg (01/17 0506) Last BM Date: 02/22/20  Weight change: Filed Weights   02/24/21 0400 02/25/21 0400 02/26/21 0506  Weight: 80 kg 79.3 kg 78.5 kg    Intake/Output:   Intake/Output Summary (Last 24 hours) at 02/26/2021 0841 Last data filed at 02/26/2021 0746 Gross per 24 hour  Intake 660 ml  Output 1200 ml  Net -540 ml      Physical Exam  CVP 2 General:   No resp difficulty HEENT: normal Neck: supple. no JVD. Carotids 2+ bilat; no bruits. No lymphadenopathy or thryomegaly appreciated. Cor: PMI nondisplaced. Regular rate & rhythm. No rubs, gallops or murmurs. Lungs: clear Abdomen: soft, nontender, nondistended. No hepatosplenomegaly. No  bruits or masses. Good bowel sounds. Extremities: no cyanosis, clubbing, rash, edema. RUE PICC Neuro: alert & orientedx3, cranial nerves grossly intact. moves all 4 extremities w/o difficulty. Affect pleasant   Telemetry   SR 80s. No PVCs  Labs    CBC No results for input(s): WBC, NEUTROABS, HGB, HCT, MCV, PLT in the last 72 hours.   Basic Metabolic Panel Recent Labs    02/25/21 0523 02/25/21 0918 02/26/21 0502  NA 129*  --  131*  K 3.4*  --  3.9  CL 91*  --  95*  CO2 28  --  27  GLUCOSE 80  --  76  BUN 11  --  8  CREATININE 1.05  --  0.99  CALCIUM 8.4*  --  8.4*  MG  --  2.2  --    Liver Function Tests No results for input(s): AST, ALT, ALKPHOS, BILITOT, PROT, ALBUMIN in the last 72 hours.  No results for input(s): LIPASE, AMYLASE in the last 72 hours. Cardiac Enzymes No results for input(s): CKTOTAL, CKMB, CKMBINDEX, TROPONINI in the last 72 hours.  BNP: BNP (last 3 results) Recent Labs    02/01/21 1522 02/13/21 1709  BNP 2,411.9* 1,831.1*    ProBNP (last 3 results) No results for input(s): PROBNP in the last 8760 hours.   D-Dimer No results for input(s): DDIMER in the last  72 hours. Hemoglobin A1C Recent Labs    02/25/21 0918  HGBA1C 6.7*   Fasting Lipid Panel No results for input(s): CHOL, HDL, LDLCALC, TRIG, CHOLHDL, LDLDIRECT in the last 72 hours.  Thyroid Function Tests No results for input(s): TSH, T4TOTAL, T3FREE, THYROIDAB in the last 72 hours.  Invalid input(s): FREET3  Other results:   Imaging    No results found.   Medications:     Scheduled Medications:  alteplase  2 mg Intracatheter Once   amiodarone  200 mg Oral BID   Chlorhexidine Gluconate Cloth  6 each Topical Daily   digoxin  0.125 mg Oral Daily   docusate sodium  200 mg Oral BID   empagliflozin  10 mg Oral Daily   enoxaparin (LOVENOX) injection  40 mg Subcutaneous Q24H   mexiletine  200 mg Oral Q12H   midodrine  2.5 mg Oral TID WC   pneumococcal 23 valent  vaccine  0.5 mL Intramuscular Tomorrow-1000   polyethylene glycol  17 g Oral Daily   senna  1 tablet Oral BID   sodium chloride flush  3 mL Intravenous Q12H   sodium chloride flush  3 mL Intravenous Q12H   spironolactone  25 mg Oral Daily    Infusions:  sodium chloride      PRN Medications: sodium chloride, acetaminophen, albuterol, ondansetron (ZOFRAN) IV, sodium chloride flush    Patient Profile   Erik Chan is a 65 y.o. male with tobacco abuse and COPD. Admitted with new biventricular HFrEF with low output.   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 Moderate to severe MR.  - Cath 1/10 moderate non-obstructive CAD. EF < 10% - ? PVC induced. > 20% PVC burden  on admit - CMRI with severe biventricular heart failure with basal septal wall mid-wall LGE, apical inferior subendocardial LGE, and RV insertion non specific scar pattern. CT chest without evidence for pulmonary sarcoidosis.  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 Myeloma panel and UPEP pending. Would consider cardiac PET as well as genetic testing as outpatient, will send ACE level (pending).  -Volume status stable. Does not need lasix.  -- Continue spiro 25 mg daily.  - Continue digoxin 0.125 - Off losartan due to soft SBP would not add while on midodrine.  - Continue jardiance 10 mg daily - Increase midodrine to 5 mg tid.  - No ? blocker w/ low output  - Renal function stable  2. PVCs/NSVT - High PVC burden 20% on admit   - PVCs suppressed.   - Continue Mexiletine - Continue PO amiodarone 200 mg bid  - keep K > 4.0 and Mg > 2.0 - needs outpatient sleep study to r/o OSA - Life Vest is in the room and he has been educated.  - K stable.   3. COPD - Former heavy smoker. Quit smoking 5 weeks ago  4. Pleural Effusion - s/p rt thoracentesis 1/13 w/ 1200 cc fluid removal  -  transudative    5. Hypokalemia - K 3.9 - supp KCl  - continue spiro   6. Hyponatremia  - Na 131 - monitor and fluid restrict    Home today. Discontinue PICC.  Life Vest is in the room.   Darrick Grinder, NP  8:41 AM 02/26/2021   Patient seen and examined with the above-signed Advanced Practice Provider and/or Housestaff. I personally reviewed laboratory data, imaging studies and relevant notes. I independently  examined the patient and formulated the important aspects of the plan. I have edited the note to reflect any of my changes or salient points. I have personally discussed the plan with the patient and/or family.  He remains stable. Volume status and coox ok. PVCs suppressed.   General:  Sitting up No resp difficulty HEENT: normal Neck: supple. no JVD. Carotids 2+ bilat; no bruits. No lymphadenopathy or thryomegaly appreciated. Cor: Regular rate & rhythm. No rubs, gallops or murmurs. Lungs: clear Abdomen: soft, nontender, nondistended. No hepatosplenomegaly. No bruits or masses. Good bowel sounds. Extremities: no cyanosis, clubbing, rash, edema Neuro: alert & orientedx3, cranial nerves grossly intact. moves all 4 extremities w/o difficulty. Affect pleasant  He is much improved. Stable for d/c. Follow closely in HF Clinic. If EF not improving will need to consider advanced therapies.   [name], MD  10:39 AM

## 2021-02-26 NOTE — Care Management Important Message (Signed)
Important Message  Patient Details  Name: Sunny Dasinger MRN: [id number] Date of Birth: May 17, 1955   Medicare Important Message Given:  Yes     Shelda Altes 02/26/2021, 7:54 AM

## 2021-02-26 NOTE — Plan of Care (Signed)
°  Problem: Education: Goal: Knowledge of General Education information will improve Description: Including pain rating scale, medication(s)/side effects and non-pharmacologic comfort measures Outcome: Progressing   Problem: Health Behavior/Discharge Planning: Goal: Ability to manage health-related needs will improve Outcome: Progressing   Problem: Clinical Measurements: Goal: Ability to maintain clinical measurements within normal limits will improve Outcome: Progressing Goal: Diagnostic test results will improve Outcome: Progressing Goal: Respiratory complications will improve Outcome: Progressing Goal: Cardiovascular complication will be avoided Outcome: Progressing   Problem: Coping: Goal: Level of anxiety will decrease Outcome: Progressing   Problem: Elimination: Goal: Will not experience complications related to bowel motility Outcome: Progressing Goal: Will not experience complications related to urinary retention Outcome: Progressing   Problem: Education: Goal: Ability to demonstrate management of disease process will improve Outcome: Progressing Goal: Ability to verbalize understanding of medication therapies will improve Outcome: Progressing Goal: Individualized Educational Video(s) Outcome: Progressing   Problem: Activity: Goal: Capacity to carry out activities will improve Outcome: Progressing   Problem: Cardiac: Goal: Ability to achieve and maintain adequate cardiopulmonary perfusion will improve Outcome: Progressing

## 2021-02-26 NOTE — Progress Notes (Signed)
Discussed with pt HF booklet including daily wts, low sodium diet, signs of fluid, fluid restrictions and walking daily. He voiced understanding. He sts he will not smoke again, quit before admit. Will refer to [place] [facility]. He asked appropriate questions and generally is receptive. [id number] Ethelda Chick CES, ACSM 12:03 PM 02/26/2021

## 2021-02-26 NOTE — TOC CM/SW Note (Addendum)
HF TOC CM spoke to pt at bedside. Pt has Life Vest in room. Has meds from [facility] pharmacy. Pt states he will purchase a scale at [facility]. Requesting rollator for home. States the RW at home he has belongs to his mother. Contacted [facility] for [employer] with seat. Contacted [facility] for dc home with HH.     Isidoro Donning RN3 CCM, Heart Failure TOC CM [phone]

## 2021-02-27 ENCOUNTER — Telehealth: Payer: Self-pay | Admitting: Cardiovascular Disease

## 2021-02-27 ENCOUNTER — Other Ambulatory Visit (HOSPITAL_COMMUNITY): Payer: Self-pay

## 2021-02-27 DIAGNOSIS — R911 Solitary pulmonary nodule: Secondary | ICD-10-CM | POA: Diagnosis not present

## 2021-02-27 DIAGNOSIS — M797 Fibromyalgia: Secondary | ICD-10-CM | POA: Diagnosis not present

## 2021-02-27 DIAGNOSIS — I3139 Other pericardial effusion (noninflammatory): Secondary | ICD-10-CM | POA: Diagnosis not present

## 2021-02-27 DIAGNOSIS — J439 Emphysema, unspecified: Secondary | ICD-10-CM | POA: Diagnosis not present

## 2021-02-27 DIAGNOSIS — I5022 Chronic systolic (congestive) heart failure: Secondary | ICD-10-CM | POA: Diagnosis not present

## 2021-02-27 DIAGNOSIS — E871 Hypo-osmolality and hyponatremia: Secondary | ICD-10-CM | POA: Diagnosis not present

## 2021-02-27 DIAGNOSIS — I4729 Other ventricular tachycardia: Secondary | ICD-10-CM | POA: Diagnosis not present

## 2021-02-27 DIAGNOSIS — I5082 Biventricular heart failure: Secondary | ICD-10-CM | POA: Diagnosis not present

## 2021-02-27 DIAGNOSIS — I493 Ventricular premature depolarization: Secondary | ICD-10-CM | POA: Diagnosis not present

## 2021-02-27 DIAGNOSIS — I429 Cardiomyopathy, unspecified: Secondary | ICD-10-CM | POA: Diagnosis not present

## 2021-02-27 DIAGNOSIS — J90 Pleural effusion, not elsewhere classified: Secondary | ICD-10-CM | POA: Diagnosis not present

## 2021-02-27 DIAGNOSIS — Z7951 Long term (current) use of inhaled steroids: Secondary | ICD-10-CM | POA: Diagnosis not present

## 2021-02-27 DIAGNOSIS — E119 Type 2 diabetes mellitus without complications: Secondary | ICD-10-CM | POA: Diagnosis not present

## 2021-02-27 DIAGNOSIS — I110 Hypertensive heart disease with heart failure: Secondary | ICD-10-CM | POA: Diagnosis not present

## 2021-02-27 DIAGNOSIS — I2510 Atherosclerotic heart disease of native coronary artery without angina pectoris: Secondary | ICD-10-CM | POA: Diagnosis not present

## 2021-02-27 DIAGNOSIS — E876 Hypokalemia: Secondary | ICD-10-CM | POA: Diagnosis not present

## 2021-02-27 LAB — CHOLESTEROL, BODY FLUID: Cholesterol, Fluid: 39 mg/dL

## 2021-02-27 MED ORDER — MIDODRINE HCL 5 MG PO TABS: 5.0000 mg | ORAL_TABLET | Freq: Three times a day (TID) | ORAL | 6 refills | Status: DC

## 2021-02-27 MED ORDER — MIDODRINE HCL 2.5 MG PO TABS: 2.5000 mg | ORAL_TABLET | Freq: Three times a day (TID) | ORAL | 6 refills | Status: DC

## 2021-02-27 NOTE — Telephone Encounter (Signed)
Pt c/o medication issue:  1. Name of Medication:  midodrine (PROAMATINE) 2.5 MG tablet  midodrine (PROAMATINE) 5 MG tablet  2. How are you currently taking this medication (dosage and times per day)? Take 1 tablet (2.5 mg total) by mouth 3 (three) times daily with meals.  Take 1 tablet (5 mg total) by mouth 3 (three) times daily with meals.  3. Are you having a reaction (difficulty breathing--STAT)? no  4. What is your medication issue? Calling to see if patient is supposed to be on both medication dosage. Please advise

## 2021-02-27 NOTE — Telephone Encounter (Signed)
Called pt to verify midodrine dosage. Pt states that at discharge he was told to take 7.5mg  TID. Changed rx to reflect current dose. Left detailed message for Noreene Larsson [facility])

## 2021-02-28 DIAGNOSIS — I420 Dilated cardiomyopathy: Secondary | ICD-10-CM | POA: Diagnosis not present

## 2021-03-01 NOTE — Telephone Encounter (Signed)
Left Message - Erik Chan is not in the office today(Friday) will have to try again Monday.

## 2021-03-01 NOTE — Progress Notes (Signed)
ADVANCED HF CLINIC CONSULT NOTE   Primary Care: Kathyrn Lass, MD HF Cardiologist: Dr. Haroldine Laws  HPI: Erik Chan is a 66 y.o. male with tobacco abuse and COPD, and new diagnosis of systolic heart failure/NICM.  Admitted 1/23 with new acute HFrEF. Echo showed  EF < 20%. PICC line placed for CVP and co-ox monitoring. Initial co-ox 52%. Started on milrinone. L/RHC on milrinone showed mild CAD, RA 15, PA mean 35, PCW 29 (v 35-40), Fick CO/CI 6.3/2.9. Had > 20% PVC burden with runs of NSVT. Started on IV amio and mexiletine, later switched to amio 200 bid and LifeVest placed. Diuresed with lasix gtt, weight down total of 50 lb during admission. Milrinone weaned off with Co-ox stable at 64.8%.  CT chest showed no sarcoidosis, but moderate right pleural effusion. Underwent right thoracentesis with removal 1200 cc fluid. Underwent cMRI showing LVEF 10%, basal septal wall LGE (mid-wall), apical subendocardial LGE, RV insertion nonspecific scar pattern, RV normal size with severe dysfunction (EF 19%). SPEP/UPEP and multiple myeloma panel negative.  CM etiology include isolated cardiac sarcoidosis vs myocarditis vs genetic cardiomyopathy +/- PVCs. Consider genetic testing and cardiac PET as outpatient. GDMT limited by low BP, but able to start spiro, dig and Jardiance. Discharge weight 173 lbs.  Today he presents for post-hospital HF follow up. Overall feeling fine. He is SOB walking on flat ground walking >50 yards. He has not tried to go up stairs yet. Remains quit from cigarettes. Denies palpitations, abnormal bleeding, CP, dizziness, edema, or PND/Orthopnea. Appetite ok. No fever or chills. Weight at home 168-172 pounds. Taking all medications. He is retired from [employer] as a Associate Professor. He will start [facility] PT this week. Not wearing LifeVest today. Lives at home with wife, they have 1 daugher.  Cardiac Studies: - Echo (1/23): EF <20%, RV severely reduced, LA/RA severley dilated  -  R/LHC (1/23): on milrinone 0.25, moderate non-obs CAD. Ao = 95/65 (79) LV = 91/29 RA = 15 RV = 54/17 PA = 60/24 (35) PCW = 29 (v = 35-40) Fick cardiac output/index = 6.3/2.9 PVR = 0.95 WU SVR = 904 FA sat = 97% PA sat = 68%, 72%  - cMRI (1/23): LVEF 10%, basal septal wall LGE (mid-wall), apical subendocardial LGE, RV insertion nonspecific scar pattern, RV normal size with severe dysfunction (EF 19%), small pericardial effusion, and moderate R pleural effusion.     Review of Systems: [y] = yes, [ ]  = no   General: Weight gain [ ] ; Weight loss [ ] ; Anorexia [ ] ; Fatigue [ ] ; Fever [ ] ; Chills [ ] ; Weakness [ ]   Cardiac: Chest pain/pressure [ ] ; Resting SOB [ ] ; Exertional SOB Blue.Reese ]; Orthopnea [ ] ; Pedal Edema [ ] ; Palpitations [ ] ; Syncope [ ] ; Presyncope [ ] ; Paroxysmal nocturnal dyspnea[ ]   Pulmonary: Cough [ ] ; Wheezing[ ] ; Hemoptysis[ ] ; Sputum [ ] ; Snoring [ ]   GI: Vomiting[ ] ; Dysphagia[ ] ; Melena[ ] ; Hematochezia [ ] ; Heartburn[ ] ; Abdominal pain [ ] ; Constipation [ ] ; Diarrhea [ ] ; BRBPR [ ]   GU: Hematuria[ ] ; Dysuria [ ] ; Nocturia[ ]   Vascular: Pain in legs with walking [ ] ; Pain in feet with lying flat [ ] ; Non-healing sores [ ] ; Stroke [ ] ; TIA [ ] ; Slurred speech [ ] ;  Neuro: Headaches[ ] ; Vertigo[ ] ; Seizures[ ] ; Paresthesias[ ] ;Blurred vision [ ] ; Diplopia [ ] ; Vision changes [ ]   Ortho/Skin: Arthritis [ ] ; Joint pain [ ] ; Muscle pain [ ] ; Joint swelling [ ] ;  Back Pain [ ] ; Rash [ ]   Psych: Depression[ ] ; Anxiety[ ]   Heme: Bleeding problems [ ] ; Clotting disorders [ ] ; Anemia [ ]   Endocrine: Diabetes [y]; Thyroid dysfunction[ ]   Past Medical History:  Diagnosis Date   CHF (congestive heart failure) ([place]) 02/13/2021   LVEF less than 20%   COPD (chronic obstructive pulmonary disease) ([facility])    Fibromyalgia    Current Outpatient Medications  Medication Sig Dispense Refill   acetaminophen (TYLENOL) 500 MG tablet Take 500 mg by mouth every 6 (six) hours as needed for mild  pain, fever or headache.     albuterol (VENTOLIN HFA) 108 (90 Base) MCG/ACT inhaler Inhale 1-2 puffs into the lungs every 4 (four) hours as needed for wheezing or shortness of breath.     amiodarone (PACERONE) 200 MG tablet Take 1 tablet (200 mg total) by mouth 2 (two) times daily. 60 tablet 6   digoxin (LANOXIN) 0.125 MG tablet Take 1 tablet (0.125 mg total) by mouth daily. 30 tablet 6   empagliflozin (JARDIANCE) 10 MG TABS tablet Take 1 tablet (10 mg total) by mouth daily. 30 tablet 5   mexiletine (MEXITIL) 200 MG capsule Take 1 capsule (200 mg total) by mouth every 12 (twelve) hours. 60 capsule 6   midodrine (PROAMATINE) 2.5 MG tablet Take 1 tablet (2.5 mg total) by mouth 3 (three) times daily with meals. Take with 5mg  to make 7.5mg  total TID 90 tablet 6   midodrine (PROAMATINE) 5 MG tablet Take 1 tablet (5 mg total) by mouth 3 (three) times daily with meals. Take with 2.5mg  to make 7.5mg  total TID 90 tablet 6   potassium chloride (KLOR-CON M) 10 MEQ tablet Take 2 tablets (20 mEq total) by mouth daily. 30 tablet 3   spironolactone (ALDACTONE) 25 MG tablet Take 1 tablet (25 mg total) by mouth daily. 30 tablet 5   No current facility-administered medications for this encounter.   Allergies  Allergen Reactions   Shellfish-Derived Products Hives and Rash   Social History   Socioeconomic History   Marital status: Married    Spouse name: Not on file   Number of children: Not on file   Years of education: Not on file   Highest education level: Not on file  Occupational History   Occupation: Banker   Tobacco Use   Smoking status: Former    Packs/day: 1.00    Years: 30.00    Pack years: 30.00    Types: Pipe, Cigarettes   Smokeless tobacco: Never  Vaping Use   Vaping Use: Never used  Substance and Sexual Activity   Alcohol use: No    Alcohol/week: 0.0 standard drinks   Drug use: No   Sexual activity: Not on file  Other Topics Concern   Not on file  Social History Narrative   Not  on file   Social Determinants of Health   Financial Resource Strain: Not on file  Food Insecurity: Not on file  Transportation Needs: Not on file  Physical Activity: Not on file  Stress: Not on file  Social Connections: Not on file  Intimate Partner Violence: Not on file   Family History  Problem Relation Age of Onset   Asthma Mother    Allergies Mother    Fibromyalgia Mother    Osteoarthritis Mother    Heart Problems Father    Osteoarthritis Maternal Grandmother    BP 110/68    Pulse 79    Wt 78.7 kg    SpO2 95%  BMI 22.29 kg/m   Wt Readings from Last 3 Encounters:  03/04/21 78.7 kg  02/26/21 78.5 kg  02/13/21 99.3 kg   PHYSICAL EXAM: General:  NAD. No resp difficulty, thin HEENT: Normal Neck: Supple. No JVD. Carotids 2+ bilat; no bruits. No lymphadenopathy or thryomegaly appreciated. Cor: PMI nondisplaced. Regular rate & rhythm. No rubs, gallops or murmurs. Lungs: Faint BS in all lung fields. Abdomen: Soft, nontender, nondistended. No hepatosplenomegaly. No bruits or masses. Good bowel sounds. Extremities: No cyanosis, clubbing, rash, trace BLE edema Neuro: Alert & oriented x 3, cranial nerves grossly intact. Moves all 4 extremities w/o difficulty. Affect pleasant.  ECG: NSR with 1st degree AVB, PR 212 msec, QTc 435 msec (personally reviewed).  Zoll interrogation: No treatments, average HR 76 bpm, daily steps 482 steps (personally reviewed).  ASSESSMENT & PLAN: 1. Chronic Biventricular HFrEF/ NICM - Echo (1/23): severely reduced EF < 20% and severely reduced RV. Moderate to severe MR.  - L/RHC (1/23): moderate non-obstructive CAD. EF < 10% - cMRI (1/23): with severe biventricular heart failure with basal septal wall mid-wall LGE, apical inferior subendocardial LGE, and RV insertion non specific scar pattern.  - CT chest (1/23): without evidence for pulmonary sarcoidosis.   - Myeloma panel and SPEP/UPEP negative. ACE level OK (37). - Etiology for CM => isolated  cardiac sarcoidosis vs prior myocarditis vs genetic cardiomyopathy, +/- PVCs - Arrange for Cardiac Pet and genetic testing today. - Stable NYHA II. Volume status stable. Does not need lasix.  - Continue spironolactone 25 mg daily.  - Continue digoxin 0.125 mg daily. - Continue Jardiance 10 mg daily. - Decrease midodrine 5 mg tid. Stop next if BP stable. - Off losartan due to soft SBP, would not add while on midodrine.  - No ? blocker w/ recent low output.  - Plan to repeat Echo in 3 months, if EF not improving may need to consider advanced HF therapies.  - Check BMET, dig level today.   2. PVCs/NSVT - >20% PVC burden on admit.   - Continue mexiletine 200 mg bid. - Continue amiodarone 200 mg bid for now.  - Keep K > 4.0 and Mg > 2.0 - Arrange sleep study. - Continue LifeVest. Discussed wearing it daily. - Check Mag today.    3. COPD - Former heavy smoker. Quit smoking 01/07/21.   4. Pleural Effusion - s/p rt thoracentesis 02/22/21 w/ 1200 cc fluid removal .  5. DM2: - A1c 6.7 (1/23). - On SGLT2i. - Has follow up with new PCP soon.   6. H/o Hypokalemia - Check labs today.   7. H/o Hyponatremia  - Labs today and fluid restrict.   Follow up in 3 weeks with PharmD (stop midodrine and add low-dose losartan), 6 weeks with APP (add low dose Toprol) and 12 weeks with Dr. Haroldine Laws + echo.  Allena Katz, FNP-BC 03/04/21

## 2021-03-04 ENCOUNTER — Encounter (HOSPITAL_COMMUNITY): Payer: Self-pay

## 2021-03-04 ENCOUNTER — Other Ambulatory Visit: Payer: Self-pay

## 2021-03-04 ENCOUNTER — Ambulatory Visit (HOSPITAL_COMMUNITY)
Admit: 2021-03-04 | Discharge: 2021-03-04 | Disposition: A | Discharge status: Home / Self Care | Payer: Medicare Other | Source: Ambulatory Visit | Attending: Family Medicine | Admitting: Family Medicine

## 2021-03-04 ENCOUNTER — Telehealth (HOSPITAL_COMMUNITY): Payer: Self-pay | Admitting: Cardiology

## 2021-03-04 VITALS — BP 110/68 | HR 79 | Wt 173.6 lb

## 2021-03-04 DIAGNOSIS — Z9889 Other specified postprocedural states: Secondary | ICD-10-CM | POA: Insufficient documentation

## 2021-03-04 DIAGNOSIS — G4719 Other hypersomnia: Secondary | ICD-10-CM | POA: Diagnosis not present

## 2021-03-04 DIAGNOSIS — I428 Other cardiomyopathies: Secondary | ICD-10-CM | POA: Diagnosis not present

## 2021-03-04 DIAGNOSIS — Z87891 Personal history of nicotine dependence: Secondary | ICD-10-CM | POA: Insufficient documentation

## 2021-03-04 DIAGNOSIS — Z7984 Long term (current) use of oral hypoglycemic drugs: Secondary | ICD-10-CM | POA: Diagnosis not present

## 2021-03-04 DIAGNOSIS — I5082 Biventricular heart failure: Secondary | ICD-10-CM | POA: Insufficient documentation

## 2021-03-04 DIAGNOSIS — I5021 Acute systolic (congestive) heart failure: Secondary | ICD-10-CM | POA: Diagnosis not present

## 2021-03-04 DIAGNOSIS — I5022 Chronic systolic (congestive) heart failure: Secondary | ICD-10-CM

## 2021-03-04 DIAGNOSIS — I493 Ventricular premature depolarization: Secondary | ICD-10-CM | POA: Diagnosis not present

## 2021-03-04 DIAGNOSIS — J90 Pleural effusion, not elsewhere classified: Secondary | ICD-10-CM | POA: Insufficient documentation

## 2021-03-04 DIAGNOSIS — Z79899 Other long term (current) drug therapy: Secondary | ICD-10-CM | POA: Diagnosis not present

## 2021-03-04 DIAGNOSIS — J449 Chronic obstructive pulmonary disease, unspecified: Secondary | ICD-10-CM | POA: Diagnosis not present

## 2021-03-04 DIAGNOSIS — E119 Type 2 diabetes mellitus without complications: Secondary | ICD-10-CM | POA: Insufficient documentation

## 2021-03-04 DIAGNOSIS — E871 Hypo-osmolality and hyponatremia: Secondary | ICD-10-CM

## 2021-03-04 DIAGNOSIS — E1159 Type 2 diabetes mellitus with other circulatory complications: Secondary | ICD-10-CM

## 2021-03-04 DIAGNOSIS — Z720 Tobacco use: Secondary | ICD-10-CM

## 2021-03-04 DIAGNOSIS — Z8639 Personal history of other endocrine, nutritional and metabolic disease: Secondary | ICD-10-CM

## 2021-03-04 DIAGNOSIS — I2510 Atherosclerotic heart disease of native coronary artery without angina pectoris: Secondary | ICD-10-CM | POA: Insufficient documentation

## 2021-03-04 LAB — CBC
HCT: 44.4 % (ref 39.0–52.0)
Hemoglobin: 14.3 g/dL (ref 13.0–17.0)
MCH: 28.9 pg (ref 26.0–34.0)
MCHC: 32.2 g/dL (ref 30.0–36.0)
MCV: 89.9 fL (ref 80.0–100.0)
Platelets: 305 10*3/uL (ref 150–400)
RBC: 4.94 MIL/uL (ref 4.22–5.81)
RDW: 16.2 % — ABNORMAL HIGH (ref 11.5–15.5)
WBC: 7.9 10*3/uL (ref 4.0–10.5)
nRBC: 0 % (ref 0.0–0.2)

## 2021-03-04 LAB — MAGNESIUM: Magnesium: 2.2 mg/dL (ref 1.7–2.4)

## 2021-03-04 LAB — BASIC METABOLIC PANEL
Anion gap: 11 (ref 5–15)
BUN: 14 mg/dL (ref 8–23)
CO2: 27 mmol/L (ref 22–32)
Calcium: 9.7 mg/dL (ref 8.9–10.3)
Chloride: 97 mmol/L — ABNORMAL LOW (ref 98–111)
Creatinine, Ser: 1.06 mg/dL (ref 0.61–1.24)
GFR, Estimated: >60 mL/min (ref >60)
Glucose, Bld: 89 mg/dL (ref 70–99)
Potassium: 5.4 mmol/L — ABNORMAL HIGH (ref 3.5–5.1)
Sodium: 135 mmol/L (ref 135–145)

## 2021-03-04 LAB — DIGOXIN LEVEL: Digoxin Level: 1.9 ng/mL (ref 0.8–2.0)

## 2021-03-04 MED ORDER — SPIRONOLACTONE 25 MG PO TABS: 12.5000 mg | ORAL_TABLET | Freq: Every day | ORAL | 5 refills | Status: DC

## 2021-03-04 MED ORDER — MIDODRINE HCL 5 MG PO TABS: 5.0000 mg | ORAL_TABLET | Freq: Three times a day (TID) | ORAL | 6 refills | Status: DC

## 2021-03-04 NOTE — Progress Notes (Addendum)
Height:  6'2"    Weight: 173 lb BMI: 22.29  Today's Date: 03/04/21  STOP BANG RISK ASSESSMENT S (snore) Have you been told that you snore?     YES   T (tired) Are you often tired, fatigued, or sleepy during the day?   YES  O (obstruction) Do you stop breathing, choke, or gasp during sleep? NO   P (pressure) Do you have or are you being treated for high blood pressure? NO   B (BMI) Is your body index greater than 35 kg/m? NO   A (age) Are you 66 years old or older? YES   N (neck) Do you have a neck circumference greater than 16 inches?      G (gender) Are you a male? YES   TOTAL STOP/BANG YES ANSWERS 4                                                                       For Office Use Only              Procedure Order Form    YES to 3+ Stop Bang questions OR two clinical symptoms - patient qualifies for WatchPAT (CPT 95800)      Clinical Notes: Will consult Sleep Specialist and refer for management of therapy due to patient increased risk of Sleep Apnea. Ordering a sleep study due to the following two clinical symptoms: Excessive daytime sleepiness G47.10 /  Loud snoring R06.83

## 2021-03-04 NOTE — Telephone Encounter (Signed)
Patient called.  Patient aware.  

## 2021-03-04 NOTE — Patient Instructions (Signed)
Medication Changes:  Decrease Midodrine to 5 mg Three times a day   Lab Work:  Done today, we will call you for abnormal results  Genetic test has been done, this has to be sent to [place] to be processed and can take 1-2 weeks to get results back.  We will let you know the results.  Testing/Procedures:  Your provider has recommended that you have a home sleep study.  We have provided you with the equipment in our office today. Please download the app and follow the instructions. YOUR PIN NUMBER IS: [id number]. Once you have completed the test you just dispose of the equipment, the information is automatically uploaded to [place] via blue-tooth technology. If your test is positive for sleep apnea and you need a home CPAP machine you will be contacted by Dr Norris Cross office [facility]) to set this up.  Your provider has recommended you have a Cardiac PET Scan at [facility]. We will get this approved with your insurance company and get it scheduled for you. We will call you with the date and time and instructions. [facility] will call you to review this information the day before the test.  Your physician has requested that you have an echocardiogram. Echocardiography is a painless test that uses sound waves to create images of your heart. It provides your doctor with information about the size and shape of your heart and how well your hearts chambers and valves are working. This procedure takes approximately one hour. There are no restrictions for this procedure. IN 3 MONTHS  Referrals:  Please follow up with our heart failure pharmacist in 3 weeks   Special Instructions // Education:  Systems analyst!!!  Do the following things EVERYDAY: Weigh yourself in the morning before breakfast. Write it down and keep it in a log. Take your medicines as prescribed Eat low salt foods--Limit salt (sodium) to 2000 mg per day.  Stay as active as you can everyday Limit all fluids for the day to less than 2  liters   Follow-Up in:   --3 weeks with pharmacy clinic  --6 weeks with APP  --12 weeks with echocardiogram and Dr [name]  At the [facility], you and your health needs are our priority. We have a designated team specialized in the treatment of Heart Failure. This Care Team includes your primary Heart Failure Specialized Cardiologist (physician), Advanced Practice Providers (APPs- Physician Assistants and Nurse Practitioners), and Pharmacist who all work together to provide you with the care you need, when you need it.   You may see any of the following providers on your designated Care Team at your next follow up:  Dr Arvilla Meres Dr Carron Curie, NP Robbie Lis, [place] [facility] [place], [place] Karle Plumber, PharmD   Please be sure to bring in all your medications bottles to every appointment.   Need to Contact us:  If you have any questions or concerns before your next appointment please send [place] a message through [place] or call our office at [phone].    TO LEAVE A MESSAGE FOR THE NURSE SELECT OPTION 2, PLEASE LEAVE A MESSAGE INCLUDING: YOUR NAME DATE OF BIRTH CALL BACK NUMBER REASON FOR CALL**this is important as we prioritize the call backs  YOU WILL RECEIVE A CALL BACK THE SAME DAY AS LONG AS YOU CALL BEFORE 4:00 PM

## 2021-03-04 NOTE — Addendum Note (Signed)
Encounter addended by: Crissie Figures, RN on: 03/04/2021 2:19 PM  Actions taken: Clinical Note Signed

## 2021-03-04 NOTE — Telephone Encounter (Signed)
-----   Message from Jacklynn Ganong, [place] sent at 03/04/2021  2:41 PM EST ----- K is high. Hold spiro x 1 day, then resume at 12.5 mg daily. Stop KCL suppl.  Repeat BMET in 1 week.

## 2021-03-04 NOTE — Progress Notes (Signed)
Patient provided home sleep device during clinic appt today along with instructions for use.  He will await a call regarding insurance precert before he proceeds to do the study.

## 2021-03-04 NOTE — Addendum Note (Signed)
Encounter addended by: Noralee Space, RN on: 03/04/2021 2:21 PM  Actions taken: Clinical Note Signed

## 2021-03-04 NOTE — Progress Notes (Signed)
Blood collected for TTR genetic testing per Dr Demaris Callander, NP.  Order form completed and both shipped by [employer] to [facility].

## 2021-03-05 ENCOUNTER — Telehealth (HOSPITAL_COMMUNITY): Payer: Self-pay

## 2021-03-05 NOTE — Telephone Encounter (Signed)
Pt insurance is active and benefits verified through BC[employer] Medicare Co-pay 0, DED 0/0 met, out of pocket $7,500/$40.71 met, co-insurance 0%. no pre-authorization required. [employer] 03/05/2021@10:01am, REF# [id number] °  °Will contact patient to see if he is interested in the Cardiac Rehab Program. If interested, patient will need to complete follow up appt. Once completed, patient will be contacted for scheduling upon review by the RN Navigator. °

## 2021-03-06 ENCOUNTER — Encounter (HOSPITAL_COMMUNITY): Payer: Medicare Other

## 2021-03-07 ENCOUNTER — Other Ambulatory Visit (HOSPITAL_COMMUNITY): Payer: Self-pay

## 2021-03-08 DIAGNOSIS — I5043 Acute on chronic combined systolic (congestive) and diastolic (congestive) heart failure: Secondary | ICD-10-CM | POA: Diagnosis not present

## 2021-03-12 ENCOUNTER — Other Ambulatory Visit: Payer: Self-pay

## 2021-03-12 ENCOUNTER — Ambulatory Visit (HOSPITAL_COMMUNITY)
Admission: RE | Admit: 2021-03-12 | Discharge: 2021-03-12 | Disposition: A | Discharge status: Home / Self Care | Payer: Medicare Other | Source: Ambulatory Visit | Attending: Internal Medicine | Admitting: Internal Medicine

## 2021-03-12 DIAGNOSIS — I5022 Chronic systolic (congestive) heart failure: Secondary | ICD-10-CM | POA: Diagnosis not present

## 2021-03-12 LAB — BASIC METABOLIC PANEL
Anion gap: 11 (ref 5–15)
BUN: 9 mg/dL (ref 8–23)
CO2: 22 mmol/L (ref 22–32)
Calcium: 8.9 mg/dL (ref 8.9–10.3)
Chloride: 103 mmol/L (ref 98–111)
Creatinine, Ser: 1.04 mg/dL (ref 0.61–1.24)
GFR, Estimated: >60 mL/min (ref >60)
Glucose, Bld: 106 mg/dL — ABNORMAL HIGH (ref 70–99)
Potassium: 4.1 mmol/L (ref 3.5–5.1)
Sodium: 136 mmol/L (ref 135–145)

## 2021-03-12 NOTE — Progress Notes (Signed)
Advanced [facility] Failure Clinic Note   Primary Care: [name], MD HF Cardiologist: Dr. [name]  HPI:  Erik Chan is a 66 y.o. male with tobacco abuse and COPD, and new diagnosis of systolic heart failure/NICM.   Admitted 02/2021 with new acute HFrEF. Echo showed  EF < 20%. PICC line placed for CVP and co-ox monitoring. Initial co-ox 52%. Started on milrinone. L/RHC on milrinone showed mild CAD, RA 15, PA mean 35, PCW 29 (v 35-40), Fick CO/CI 6.3/2.9. Had > 20% PVC burden with runs of NSVT. Started on IV amiodarone and mexiletine, later switched to amio 200 mg BID and LifeVest placed. Diuresed with Lasix gtt, weight down total of 50 lbs during admission. Milrinone weaned off with Co-ox stable at 64.8%.  CT chest showed no sarcoidosis, but moderate right pleural effusion. Underwent right thoracentesis with removal of 1200 cc fluid. Underwent cMRI showing LVEF 10%, basal septal wall LGE (mid-wall), apical subendocardial LGE, RV insertion nonspecific scar pattern, RV normal size with severe dysfunction (EF 19%). SPEP/UPEP and multiple myeloma panel negative.  CM etiology include isolated cardiac sarcoidosis vs myocarditis vs genetic cardiomyopathy +/- PVCs. Consider genetic testing and cardiac PET as outpatient. GDMT limited by low BP, but able to start spironolactone, digoxin and Jardiance. Discharge weight 173 lbs.   Presented to AHF clinic for post-hospital HF follow up 03/04/21. Overall was feeling fine. He reported getting SOB walking on flat ground walking >50 yards. He had not tried to go up stairs yet. Had quit cigarettes. Denied palpitations, abnormal bleeding, CP, dizziness, edema, or PND/Orthopnea. Appetite was ok. No fever or chills. Weight at home was 168-172 pounds. Reported taking all medications. 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 He was scheduled to start [facility] PT the following week. Was not wearing LifeVest at clinic visit. Lives at home with wife, they have  1 daughter.  Today he returns to HF clinic for pharmacist medication titration. At last visit with APP, midodrine was decreased to 5 mg TID. Additionally, potassium was elevated at 5.4 so he was instructed to hold spironolactone for 1 day then resume at 12.5 mg daily and to stop potassium supplements. Of note, he was confused with instructions and resumed spironolactone 25 mg daily, instead of decreasing to 12.5 mg daily. He was seen by Dr. Tresa Endo on 03/20/21 and his digoxin was changed to 0.125 mg daily alternating with 0.0625 mg daily.  Overall he is feeling well today. Was wearing his LifeVest. No dizziness, lightheadedness, CP or palpitations. Gets SOB after walking ~50 yards and presented today in a wheelchair since he had "been very active that morning". No LEE, PND or orthopnea. SBP at home has been 110-118, BP 104/68 in clinic.    HF Medications: Spironolactone 12.5 mg daily - taking 25 mg daily Jardiance 10 mg daily Digoxin 0.125 mg daily alternating with 0.0625 mg daily.  Has the patient been experiencing any side effects to the medications prescribed?  no  Does the patient have any problems obtaining medications due to transportation or finances?   No - BCBS Medicare  Understanding of regimen: fair Understanding of indications: fair Potential of compliance: good Patient understands to avoid NSAIDs. Patient understands to avoid decongestants.    Pertinent Lab Values: 03/25/21: Serum creatinine 0.91, BUN 13, Potassium 4.5, Sodium 135  Vital Signs: Weight: 167.6 lbs (last clinic weight: 173.2 lbs - was not wearing LiveVest at that visit) Blood pressure: 104/68  Heart rate: 72   Assessment/Plan: 1.  Chronic Biventricular HFrEF/ NICM - Echo (02/2021): severely reduced EF < 20% and severely reduced RV. Moderate to severe MR.  - L/RHC (02/2021): moderate non-obstructive CAD. EF < 10% - cMRI (02/2021): with severe biventricular heart failure with basal septal wall mid-wall LGE, apical  inferior subendocardial LGE, and RV insertion non specific scar pattern.  - CT chest (02/2021): without evidence for pulmonary sarcoidosis.   - Myeloma panel and SPEP/UPEP negative. ACE level OK (37). - Etiology for CM => isolated cardiac sarcoidosis vs prior myocarditis vs genetic cardiomyopathy, +/- PVCs - Previously arranged for Cardiac PET and genetic testing. - Stable NYHA II-III. Volume status stable.  - Does not need Lasix.  - Continue spironolactone 25 mg daily. He was confused with instructions to decrease to 12.5 mg daily so he never made the change and continued 25 mg daily. Two BMETs have resulted with normal potassium levels since he stopped the potassium supplementation. Will continue spironolactone at 25 mg daily and medication list updated.  - Continue Jardiance 10 mg daily. - Continue digoxin 0.125 mg daily alternating with 0.0625 mg daily per Dr. Evette Georges office. - Decrease midodrine to 2.5 mg TID. Stop next if BP stable. - Off losartan due to soft SBP, would not add while on midodrine.  - No ? blocker w/ recent low output.  - Plan to repeat Echo in 3 months, if EF not improving may need to consider advanced HF therapies.     2. PVCs/NSVT - >20% PVC burden on admit.   - Continue mexiletine 200 mg BID. - Continue amiodarone 200 mg BID for now.  - Keep K > 4.0 and Mg > 2.0 - Continue LifeVest.   3. COPD - Former heavy smoker. Quit smoking 01/07/21.   4. Pleural Effusion - s/p rt thoracentesis 02/22/21 w/ 1200 cc fluid removal .   5. DM2: - A1c 6.7 (1/23). - On SGLT2i. - Has follow up with new PCP soon.     Follow up 3 weeks with APP Clinic.   Audry Riles, PharmD, BCPS, BCCP, CPP Heart Failure Clinic Pharmacist [phone]

## 2021-03-13 NOTE — Telephone Encounter (Signed)
Will review with pt next week at ov.

## 2021-03-14 ENCOUNTER — Inpatient Hospital Stay: Admission: RE | Admit: 2021-03-14 | Payer: Medicare Other | Source: Ambulatory Visit

## 2021-03-14 NOTE — Telephone Encounter (Signed)
Appointment on 02/08-  

## 2021-03-15 ENCOUNTER — Telehealth (HOSPITAL_COMMUNITY): Payer: Self-pay | Admitting: Surgery

## 2021-03-15 ENCOUNTER — Telehealth (HOSPITAL_COMMUNITY): Payer: Self-pay | Admitting: *Deleted

## 2021-03-15 NOTE — Telephone Encounter (Signed)
Patient called and I left a message to inform that insurance pre cert is not required and it is fine to proceed with the ordered home sleep study.

## 2021-03-20 ENCOUNTER — Ambulatory Visit: Payer: Medicare Other | Admitting: Cardiovascular Disease

## 2021-03-20 ENCOUNTER — Other Ambulatory Visit: Payer: Self-pay

## 2021-03-20 ENCOUNTER — Encounter: Payer: Self-pay | Admitting: Cardiovascular Disease

## 2021-03-20 DIAGNOSIS — I493 Ventricular premature depolarization: Secondary | ICD-10-CM | POA: Diagnosis not present

## 2021-03-20 DIAGNOSIS — Z8639 Personal history of other endocrine, nutritional and metabolic disease: Secondary | ICD-10-CM

## 2021-03-20 DIAGNOSIS — I4729 Other ventricular tachycardia: Secondary | ICD-10-CM | POA: Diagnosis not present

## 2021-03-20 DIAGNOSIS — I428 Other cardiomyopathies: Secondary | ICD-10-CM | POA: Diagnosis not present

## 2021-03-20 DIAGNOSIS — E871 Hypo-osmolality and hyponatremia: Secondary | ICD-10-CM

## 2021-03-20 DIAGNOSIS — I5020 Unspecified systolic (congestive) heart failure: Secondary | ICD-10-CM

## 2021-03-20 DIAGNOSIS — E1159 Type 2 diabetes mellitus with other circulatory complications: Secondary | ICD-10-CM

## 2021-03-20 DIAGNOSIS — J449 Chronic obstructive pulmonary disease, unspecified: Secondary | ICD-10-CM

## 2021-03-20 DIAGNOSIS — R7889 Finding of other specified substances, not normally found in blood: Secondary | ICD-10-CM

## 2021-03-20 DIAGNOSIS — I5022 Chronic systolic (congestive) heart failure: Secondary | ICD-10-CM

## 2021-03-20 MED ORDER — DIGOXIN 125 MCG PO TABS: ORAL_TABLET | ORAL | 6 refills | Status: DC

## 2021-03-20 NOTE — Patient Instructions (Addendum)
Medication Instructions:   CHANGE IN MEDICATION - DIGOXIN ALTERNATING DOSE  ON EVEN DAYS OF THE MONTH   TAKE 1 TABLET (0 .125 MG ) ON  ODD DAYS OF THE MONTH  TAKE 1/2 TABLET (0.625 MG)    *If you need a refill on your cardiac medications before your next appointment, please call your pharmacy*   Lab Work: Monday Mar 25, 2021 BMP DIG LEVEL  -- DO NOT TAKE YOUR DIGOXIN TABLET UNTIL YOU DO LAB WORK THEN TAKE      MEDICATION FOR THAT DAY  If you have labs (blood work) drawn today and your tests are completely normal, you will receive your results only by: MyChart Message (if you have MyChart) OR A paper copy in the mail If you have any lab test that is abnormal or we need to change your treatment, we will call you to review the results.   Testing/Procedures: NOT NEEDED   Follow-Up: At [facility], you and your health needs are our priority.  As part of our continuing mission to provide you with exceptional heart care, we have created designated Provider Care Teams.  These Care Teams include your primary Cardiologist (physician) and Advanced Practice Providers (APPs -  Physician Assistants and Nurse Practitioners) who all work together to provide you with the care you need, when you need it.  We recommend signing up for the patient portal called "MyChart".  Sign up information is provided on this After Visit Summary.  MyChart is used to connect with patients for Virtual Visits (Telemedicine).  Patients are able to view lab/test results, encounter notes, upcoming appointments, etc.  Non-urgent messages can be sent to your provider as well.   To learn more about what you can do with MyChart, go to [email or web address].    Your next appointment:   4 month(s)  The format for your next appointment:   In Person  Provider:   Nicki Guadalajara, MD

## 2021-03-20 NOTE — Progress Notes (Signed)
Cardiology Office Note    Date:  03/27/2021   ID:  Erik Chan, DOB 06-01-1955, MRN [id number]  PCP:  Erik Lass, MD  Cardiologist:  Shelva Majestic, MD    F/U by Erik Lass, MD of [facility] for new onset CHF.   History of Present Illness:  Erik Chan is a 66 y.o. male who is followed by Dr. Kathyrn Chan.  He has a history of COPD and is recently developed significant swelling over the past 2 to 4 weeks associated with increasing shortness of breath.  He had recently had negative testing for COVID RSV and flu.  He was recently started on Lasix 40 mg following laboratory which showed a BNP at 2000.  On January 21, 2021 hemoglobin A1c was 6.2 and on December 19 hemoglobin was 14.2, creatinine 1.01, potassium 4.1, TSH 1.17 and ALT was increased to 291.  He had undergone an abdominal ultrasound on January 30, 2021 which showed a 2.4 cm shadowing gallstone with focal wall thickening measuring up to 5 mm.  In the liver was a hypoechoic structure in the left hepatic lobe measuring 2.0 x 1.9 cm for which future evaluation was recommended.  There was evidence for small amount of perihepatic ascites.  Our office was contacted and he was added onto my schedule to be seen on February 01, 2021 for initial evaluation.  Patient denies any history of known coronary artery disease chest tightness.  He had smoked cigarettes for over 45 years but he quit in November 2022.  He admits to allergies to shellfish.  He denied any other prior medical history with the exception of "some COPD before I quit smoking."    During that evaluation, his blood pressure was low but stable without orthostatic change.  Resting pulse was 100 and he was recently found to have ALT elevation at 291.  I recommended follow-up laboratory, with CMP, BNP, amylase, lipid studies and follow-up chest x-ray and 2D echo evaluation.  I recommended initially he increase Lasix to 60 mg in the morning and 40 mg in the evening tomorrow and then  changed to 40 twice a day.  She had low-dose metoprolol succinate 12.5 mg.  He was admitted to the hospital on February 13, 2021 with increasing shortness of breath.  Echo showed EF less than 20%.  A PICC line was placed for CVP and colonics monitoring with initial Choloxin 52%.  He was started on milrinone.  He underwent left and right heart catheterization by Dr. Haroldine Laws the RA 15, PA mean 35, PCW pressure 29 with V wave 35-40 and cardiac output/cardiac index 6.3/2.9.  He was treated with aggressive diuresis, milrinone was weaned and discontinued on January 14.  During his hospitalization he lost approximately 50 pounds.  He was found to have frequent PVCs and runs of nonsustained VT initially and was treated with mexiletine and IV amiodarone and later switched to oral amiodarone 20 mg twice a day.  Discharged with a LifeVest.  During his evaluation cardiac MRI showed an EF of 10% with basal septal wall late gadolinium enhancement in the mid wall and apical subendocardial LGE as well as RV insertion nonspecific scar pattern.  RV was normal size with severe dysfunction at 19%.  Chest CT did not show evidence for sarcoidosis and there was moderate right pleural effusion for which he underwent right thoracentesis on January 13 with removal of 1200 cc of fluid.  A myeloma panel with SPEP/UPEP was negative.  He was initiated on guideline directed medical  therapy with spironolactone, digoxin, and Jardiance.  Entresto or losartan was unable to be initiated due to soft blood pressure.  Following hospital discharge, he was seen in advanced heart failure clinic on March 04, 2021 by Erik Chan and at that time he felt improved and was walking on flat ground approximately 50 yards.  He denied any chest pain.  Presently, he feels well and denies any significant shortness of breath.  He is wearing a LifeVest most of the time but did not put it on to come to this office visit.  His weight has remained stable since  hospital discharge.  He states initial weight was 219 with subsequent weight 170.  Presently he is on amiodarone 20 mg twice a day, digoxin 1.125 mg daily, Jardiance 10 mg, mexiletine 200 mg twice a day, and has been taking midodrine as well as 12.5 spironolactone.  He is unaware of any palpitations.  Current Medications: Outpatient Medications Prior to Visit  Medication Sig Dispense Refill   acetaminophen (TYLENOL) 500 MG tablet Take 500 mg by mouth every 6 (six) hours as needed for mild pain, fever or headache.     albuterol (VENTOLIN HFA) 108 (90 Base) MCG/ACT inhaler Inhale 1-2 puffs into the lungs every 4 (four) hours as needed for wheezing or shortness of breath.     amiodarone (PACERONE) 200 MG tablet Take 1 tablet (200 mg total) by mouth 2 (two) times daily. 60 tablet 6   digoxin (LANOXIN) 0.125 MG tablet Take 1 tablet (0.125 mg total) by mouth daily. 30 tablet 6   empagliflozin (JARDIANCE) 10 MG TABS tablet Take 1 tablet (10 mg total) by mouth daily. 30 tablet 5   mexiletine (MEXITIL) 200 MG capsule Take 1 capsule (200 mg total) by mouth every 12 (twelve) hours. 60 capsule 6   midodrine (PROAMATINE) 5 MG tablet Take 1 tablet (5 mg total) by mouth 3 (three) times daily with meals. 90 tablet 6   spironolactone (ALDACTONE) 25 MG tablet Take 0.5 tablets (12.5 mg total) by mouth daily. 15 tablet 5   No facility-administered medications prior to visit.     Allergies:   Shellfish-derived products   Social History   Socioeconomic History   Marital status: Married    Spouse name: Not on file   Number of children: Not on file   Years of education: Not on file   Highest education level: Not on file  Occupational History   Occupation: Banker   Tobacco Use   Smoking status: Former    Packs/day: 1.00    Years: 30.00    Pack years: 30.00    Types: Pipe, Cigarettes   Smokeless tobacco: Never  Vaping Use   Vaping Use: Never used  Substance and Sexual Activity   Alcohol use: No     Alcohol/week: 0.0 standard drinks   Drug use: No   Sexual activity: Not on file  Other Topics Concern   Not on file  Social History Narrative   Not on file   Social Determinants of Health   Financial Resource Strain: Not on file  Food Insecurity: Not on file  Transportation Needs: Not on file  Physical Activity: Not on file  Stress: Not on file  Social Connections: Not on file    He was born in [place].  Additional social history is notable that he is married for 20 years.  He has 1 child and 2 grandchildren.  He completed 2 years of college and is retired from [employer]  [employer].  He had been walking up to about a month ago of 2-3 blocks but over the past month has not been walking.  He had smoked for 45 years, starting at age 24 and quit in November 2022  Family History:  The patient's family history includes Allergies in his mother; Asthma in his mother; Fibromyalgia in his mother; Heart Problems in his father; Osteoarthritis in his maternal grandmother and mother.  His mother is living at age 92.  Father died at age 14 and had intestinal cancer and COVID complications.  He has no siblings.  ROS General: Negative; No fevers, chills, or night sweats; recent 50 pound weight loss HEENT: Negative; No changes in vision or hearing, sinus congestion, difficulty swallowing Pulmonary: COPD Cardiovascular: See HPI GI: Negative; No nausea, vomiting, diarrhea, or abdominal pain GU: Negative; No dysuria, hematuria, or difficulty voiding Musculoskeletal: Negative; no myalgias, joint pain, or weakness Hematologic/Oncology: Negative; no easy bruising, bleeding Endocrine: Negative; no heat/cold intolerance; no diabetes Neuro: Negative; no changes in balance, headaches Skin: Negative; No rashes or skin lesions Psychiatric: Negative; No behavioral problems, depression Sleep: Negative; No snoring, daytime sleepiness, hypersomnolence, bruxism, restless legs, hypnogognic hallucinations, no  cataplexy Other comprehensive 14 point system review is negative.   PHYSICAL EXAM:   VS:  BP 108/60    Pulse 79    Ht $R'6\' 2"'wq$  (1.88 m)    Wt 170 lb 3.2 oz (77.2 kg)    SpO2 97%    BMI 21.85 kg/m     Repeat blood pressure by me 108/68 supine and 108/66 standing  Wt Readings from Last 3 Encounters:  03/26/21 167 lb 9.6 oz (76 kg)  03/20/21 170 lb 3.2 oz (77.2 kg)  03/04/21 173 lb 9.6 oz (78.7 kg)    General: Alert, oriented, no distress.  Skin: normal turgor, no rashes, warm and dry HEENT: Normocephalic, atraumatic. Pupils equal round and reactive to light; sclera anicteric; extraocular muscles intact;  Nose without nasal septal hypertrophy Mouth/Parynx benign; Mallinpatti scale 3 Neck: No JVD, no carotid bruits; normal carotid upstroke Lungs: clear to ausculatation and percussion; no wheezing or rales Chest wall: without tenderness to palpitation Heart: PMI not displaced, RRR, s1 s2 normal, 1/6 systolic murmur, no diastolic murmur, no rubs, gallops, thrills, or heaves Abdomen: soft, nontender; no hepatosplenomehaly, BS+; abdominal aorta nontender and not dilated by palpation. Back: no CVA tenderness Pulses 2+ Musculoskeletal: full range of motion, normal strength, no joint deformities Extremities: Resolution of prior 2+ tense bilateral lower extremity edema; no clubbing cyanosis, Homan's sign negative  Neurologic: grossly nonfocal; Cranial nerves grossly wnl Psychologic: Normal mood and affect    Studies/Labs Reviewed:   March 20, 2021 ECG (independently read by me):  NSR at 74, Nonspecific T wave, PR 202 msecv  February 01, 2021 ECG (independently read by me): Sinus rhythm at 99 bpm with occasional PACs and isolated PVC.  Nonspecific T wave abnormality.  Recent Labs: BMP Latest Ref Rng & Units 03/25/2021 03/12/2021 03/04/2021  Glucose 70 - 99 mg/dL 95 106(H) 89  BUN 8 - 27 mg/dL $Remove'13 9 14  'ANztFtK$ Creatinine 0.76 - 1.27 mg/dL 0.91 1.04 1.06  BUN/Creat Ratio 10 - 24 14 - -  Sodium 134  - 144 mmol/L 135 136 135  Potassium 3.5 - 5.2 mmol/L 4.5 4.1 5.4(H)  Chloride 96 - 106 mmol/L 99 103 97(L)  CO2 20 - 29 mmol/L $RemoveB'20 22 27  'KXuWWEOf$ Calcium 8.6 - 10.2 mg/dL 9.3 8.9 9.7     Hepatic Function Latest Ref  Rng & Units 02/22/2021 02/01/2021  Total Protein 6.5 - 8.1 g/dL 5.9(L) 6.6  Albumin 3.8 - 4.8 g/dL - 4.0  AST 0 - 40 IU/L - 55(H)  ALT 0 - 44 IU/L - 145(H)  Alk Phosphatase 44 - 121 IU/L - 141(H)  Total Bilirubin 0.0 - 1.2 mg/dL - 2.0(H)    CBC Latest Ref Rng & Units 03/04/2021 02/23/2021 02/22/2021  WBC 4.0 - 10.5 K/uL 7.9 8.3 7.9  Hemoglobin 13.0 - 17.0 g/dL 14.3 13.8 12.8(L)  Hematocrit 39.0 - 52.0 % 44.4 40.1 38.0(L)  Platelets 150 - 400 K/uL 305 167 154   Lab Results  Component Value Date   MCV 89.9 03/04/2021   MCV 86.2 02/23/2021   MCV 87.2 02/22/2021   Lab Results  Component Value Date   TSH 1.632 02/15/2021   Lab Results  Component Value Date   HGBA1C 6.7 (H) 02/25/2021     BNP    Component Value Date/Time   BNP 1,831.1 (H) 02/13/2021 1709    ProBNP No results found for: PROBNP   Lipid Panel     Component Value Date/Time   CHOL 142 02/22/2021 2012   CHOL 133 02/01/2021 1655   TRIG 115 02/01/2021 1655   HDL 35 (L) 02/01/2021 1655   CHOLHDL 3.8 02/01/2021 1655   LDLCALC 77 02/01/2021 1655   LABVLDL 21 02/01/2021 1655     RADIOLOGY: MR LIVER WO CONRTAST  Result Date: 03/27/2021 CLINICAL DATA:  Abnormal ultrasound demonstrating a 2.1 cm area of relative hypoechogenicity. Prior elevated liver function tests. No history of cancer. Patient refused IV contrast. EXAM: MRI ABDOMEN WITHOUT CONTRAST TECHNIQUE: Multiplanar multisequence MR imaging was performed without the administration of intravenous contrast. COMPARISON:  01/30/2021 abdominal ultrasound FINDINGS: Mild limitations secondary to lack of IV contrast and respiratory motion involving the axial T2 fat sat images. Lower chest: Mild cardiomegaly, without pericardial or pleural effusion.  Hepatobiliary: Normal noncontrast appearance of the liver. No correlate for the ultrasound abnormality. No restricted diffusion within the liver. No steatosis. 1.5 cm gallstone without acute cholecystitis or biliary duct dilatation. Pancreas:  Normal, without mass or ductal dilatation. Spleen: Signal dropout on long TE imaging, suggesting iron deposition within. No splenomegaly. Adrenals/Urinary Tract: Normal adrenal glands and right kidney. Upper pole left renal 4.9 cm cyst. No hydronephrosis. Stomach/Bowel: Proximal gastric underdistention. Apparent wall thickening may be secondary. No significant wall thickening on 02/22/2021 chest CT. Normal small bowel caliber. Colonic stool burden suggests constipation. Vascular/Lymphatic: Normal aortic caliber. No retroperitoneal or retrocrural adenopathy. Other:  No ascites. Musculoskeletal: Minimal convex right thoracic spine curvature. IMPRESSION: 1. No evidence of hepatic mass, given mild above limitations. The ultrasound abnormality was likely artifactual. 2.  No acute abdominal process. 3.  Possible constipation. Electronically Signed   By: Abigail Miyamoto M.D.   On: 03/27/2021 14:42     Additional studies/ records that were reviewed today include:   Thoroughly reviewed the hospital record from his hospital evaluation of January 4 through February 26, 2021.  Subsequent advanced heart failure notes were reviewed  ASSESSMENT:    1. HFrEF (heart failure with reduced ejection fraction) ([place])   2. Nonischemic cardiomyopathy ([place])   3. NSVT (nonsustained ventricular tachycardia)   4. PVC's (premature ventricular contractions)   5. Elevated digoxin level   6. Chronic obstructive pulmonary disease, unspecified COPD type ([place])   7. Type 2 diabetes mellitus with other circulatory complication, without long-term current use of insulin [facility])     PLAN:  Mr. Neng Albee is  a 66 year old gentleman who started smoking at age 59 and recently quit smoking in November,  2022.  When I saw him for my initial working evaluation he denied chest pain or palpitations but admitted to a 2 to 4-week history of progressive shortness of breath and lower extremity edema. He had negative test for COVID, RSV and flu.  An abdominal ultrasound showed cholelithiasis with gallbladder wall thickening which was nonspecific and may represent chronic cholecystitis.  He also was found to have an hypoechoic structure in the left hepatic lobe which may be focal fatty sparing or hepatic mass and further evaluation was recommended.  There was mild perihepatic ascites.  I initially saw him, Lasix dose was increased with improvement in some of his leg edema.  He was subsequently evaluated at [facility] and had an echo on December 4 which showed an EF less than 20% with global hypokinesis.  With his increasing shortness of breath he was hospitalized with acute HFr EF.  Initial Choloxin of 52%.  He required milrinone for inotropic support.  He had undergone right and left heart catheterization which revealed mild CAD.  During his hospitalization with aggressive diuresis he lost approximately 50 pounds.  He was found to have runs of nonsustained VT with greater than 20% PVC burden on admission which improved with mexiletine and IV amiodarone and later switched to oral amiodarone therapy.  Cardiac MRI showed basal septal LGE enhancement with apical's endocardial LGE enhancement.  There was severe RV dysfunction with EF at 19%.  CT did not show evidence for sarcoid.  Due to significant pleural effusion, he underwent successful thoracentesis with 1200 cc of fluid removed.  Presently, he is feeling well on a medical regimen consisting of amiodarone 20 mg twice a day, digoxin 0.125 mg daily, Jardiance 10 mg, mexiletine 200 mg every 12 hours in addition to spironolactone 12.5 mg.  Apparently has been taking midodrine at variable dose.  On exam today he is not orthostatic.  He appears fairly well compensated.  His  recent digoxin level was 1.9 on March 04, 2021.  I have suggested that he reduce his digoxin from 0.125 mg daily down to 0.125 mg alternating with 0.0625 mg every other day.  At present his blood pressure has been low to initiate Entresto but see seems to be fairly euvolemic on Jardiance in addition to spironolactone.  Follow-up laboratory when necessary to monitor his drugs, renal function, LFTs and thyroid function on amiodarone.  He was not wearing his LifeVest today and I encouraged him to continue wearing his LifeVest continuously.  Hopefully there will be some improvement in LV function.  He is scheduled for follow-up pharmacy clinic evaluation in February 14 and will also be undergoing MRI of his liver.  He has a follow-up CHF evaluation in March with follow-up echo planned in April and follow-up office visit with Dr. Haroldine Laws.  I will see him in 4 months for reevaluation or sooner as needed    Medication Adjustments/Labs and Tests Ordered: Current medicines are reviewed at length with the patient today.  Concerns regarding medicines are outlined above.  Medication changes, Labs and Tests ordered today are listed in the Patient Instructions below. Patient Instructions  Medication Instructions:   CHANGE IN MEDICATION - DIGOXIN ALTERNATING DOSE  ON EVEN DAYS OF THE MONTH   TAKE 1 TABLET (0 .125 MG ) ON  ODD DAYS OF THE MONTH  TAKE 1/2 TABLET (0.625 MG)    *If you need a refill on your cardiac  medications before your next appointment, please call your pharmacy*   Lab Work: Monday Mar 25, 2021 BMP DIG LEVEL  -- DO NOT TAKE YOUR DIGOXIN TABLET UNTIL YOU DO LAB WORK THEN TAKE      MEDICATION FOR THAT DAY  If you have labs (blood work) drawn today and your tests are completely normal, you will receive your results only by: MyChart Message (if you have MyChart) OR A paper copy in the mail If you have any lab test that is abnormal or we need to change your treatment, we will call you to  review the results.   Testing/Procedures: NOT NEEDED   Follow-Up: At [facility], you and your health needs are our priority.  As part of our continuing mission to provide you with exceptional heart care, we have created designated Provider Care Teams.  These Care Teams include your primary Cardiologist (physician) and Advanced Practice Providers (APPs -  Physician Assistants and Nurse Practitioners) who all work together to provide you with the care you need, when you need it.  We recommend signing up for the patient portal called "MyChart".  Sign up information is provided on this After Visit Summary.  MyChart is used to connect with patients for Virtual Visits (Telemedicine).  Patients are able to view lab/test results, encounter notes, upcoming appointments, etc.  Non-urgent messages can be sent to your provider as well.   To learn more about what you can do with MyChart, go to [email or web address].    Your next appointment:   4 month(s)  The format for your next appointment:   In Person  Provider:   Shelva Majestic, MD        Signed, Shelva Majestic, MD  03/27/2021 5:43 PM    [facility] [address], [place], [place], [place]  [address] Phone: [phone]

## 2021-03-21 ENCOUNTER — Encounter (HOSPITAL_BASED_OUTPATIENT_CLINIC_OR_DEPARTMENT_OTHER): Payer: Medicare Other | Admitting: Cardiology

## 2021-03-21 DIAGNOSIS — I5021 Acute systolic (congestive) heart failure: Secondary | ICD-10-CM | POA: Diagnosis not present

## 2021-03-21 NOTE — Procedures (Signed)
° °  Sleep Study Report  Patient Information Study Date: 03/21/21 Patient Name: Erik Chan Patient ID: [id number] Birth Date: 01/21/2056 Age: 67 Gender: Male  TEST DESCRIPTION: Home sleep apnea testing was completed using the WatchPat, a Type 1 device, utilizing peripheral arterial tonometry (PAT), chest movement, actigraphy, pulse oximetry, pulse rate, body position and snore. AHI was calculated with apnea and hypopnea using valid sleep time as the denominator. RDI includes apneas, hypopneas, and RERAs. The data acquired and the scoring of sleep and all associated events were performed in accordance with the recommended standards and specifications as outlined in the AASM Manual for the Scoring of Sleep and Associated Events 2.2.0 (2015).  FINDINGS: 1. No evidence of Obstructive Sleep Apnea with AHI 3.4/hr. 2. No Central Sleep Apnea. 3. Oxygen desaturations as low as 86%. 4. Moderate snoring was present. O2 sats were < 88% for 0.9 minutes. 5. Total sleep time was 4 hrs and 22 min. 6. 10.5% of total sleep time was spent in REM sleep. 7. Normal sleep onset latency at 16 min. 8. Prolonged REM sleep onset latency at . 9. Total awakenings were 10.  DIAGNOSIS: Normal study with no significant sleep disordered breathing.  RECOMMENDATIONS: 1. Normal study with no significant sleep disordered breathing.  2. Healthy sleep recommendations include: adequate nightly sleep (normal 7-9 hrs/night), avoidance of caffeine after noon and alcohol near bedtime, and maintaining a sleep environment that is cool, dark and quiet.  3. Weight loss for overweight patients is recommended.  4. Snoring recommendations include: weight loss where appropriate, side sleeping, and avoidance of alcohol before bed.  5. Operation of motor vehicle or dangerous equipment must be avoided when feeling drowsy, excessively sleepy, or mentally fatigued.  6. An ENT consultation which may be useful for  specific causes of and possible treatment of bothersome snoring .  7. Weight loss may be of benefit in reducing the severity of snoring.   Signature: Electronically Signed: 03/21/21 Armanda Magic, MD; [facility]; Diplomat, American Board of Sleep Medicine

## 2021-03-25 ENCOUNTER — Other Ambulatory Visit: Payer: Self-pay

## 2021-03-25 ENCOUNTER — Ambulatory Visit: Payer: Medicare Other

## 2021-03-25 DIAGNOSIS — E871 Hypo-osmolality and hyponatremia: Secondary | ICD-10-CM | POA: Diagnosis not present

## 2021-03-25 DIAGNOSIS — R7889 Finding of other specified substances, not normally found in blood: Secondary | ICD-10-CM | POA: Diagnosis not present

## 2021-03-25 DIAGNOSIS — I5022 Chronic systolic (congestive) heart failure: Secondary | ICD-10-CM

## 2021-03-25 DIAGNOSIS — G4719 Other hypersomnia: Secondary | ICD-10-CM

## 2021-03-25 DIAGNOSIS — Z8639 Personal history of other endocrine, nutritional and metabolic disease: Secondary | ICD-10-CM | POA: Diagnosis not present

## 2021-03-26 ENCOUNTER — Other Ambulatory Visit (HOSPITAL_COMMUNITY): Payer: Self-pay

## 2021-03-26 ENCOUNTER — Ambulatory Visit (HOSPITAL_COMMUNITY)
Admission: RE | Admit: 2021-03-26 | Discharge: 2021-03-26 | Disposition: A | Discharge status: Home / Self Care | Payer: Medicare Other | Source: Ambulatory Visit | Attending: Cardiology | Admitting: Cardiology

## 2021-03-26 ENCOUNTER — Ambulatory Visit
Admission: RE | Admit: 2021-03-26 | Discharge: 2021-03-26 | Disposition: A | Discharge status: Home / Self Care | Payer: Medicare Other | Source: Ambulatory Visit | Attending: Family Medicine | Admitting: Family Medicine

## 2021-03-26 ENCOUNTER — Other Ambulatory Visit: Payer: Self-pay | Admitting: Family Medicine

## 2021-03-26 ENCOUNTER — Other Ambulatory Visit: Payer: Self-pay

## 2021-03-26 VITALS — BP 104/68 | HR 72 | Wt 167.6 lb

## 2021-03-26 DIAGNOSIS — Z87891 Personal history of nicotine dependence: Secondary | ICD-10-CM | POA: Diagnosis not present

## 2021-03-26 DIAGNOSIS — J449 Chronic obstructive pulmonary disease, unspecified: Secondary | ICD-10-CM | POA: Diagnosis not present

## 2021-03-26 DIAGNOSIS — Z7984 Long term (current) use of oral hypoglycemic drugs: Secondary | ICD-10-CM | POA: Insufficient documentation

## 2021-03-26 DIAGNOSIS — I472 Ventricular tachycardia, unspecified: Secondary | ICD-10-CM | POA: Diagnosis not present

## 2021-03-26 DIAGNOSIS — I493 Ventricular premature depolarization: Secondary | ICD-10-CM | POA: Insufficient documentation

## 2021-03-26 DIAGNOSIS — R7989 Other specified abnormal findings of blood chemistry: Secondary | ICD-10-CM | POA: Diagnosis not present

## 2021-03-26 DIAGNOSIS — E119 Type 2 diabetes mellitus without complications: Secondary | ICD-10-CM | POA: Insufficient documentation

## 2021-03-26 DIAGNOSIS — I2510 Atherosclerotic heart disease of native coronary artery without angina pectoris: Secondary | ICD-10-CM | POA: Diagnosis not present

## 2021-03-26 DIAGNOSIS — Z79899 Other long term (current) drug therapy: Secondary | ICD-10-CM | POA: Insufficient documentation

## 2021-03-26 DIAGNOSIS — I5022 Chronic systolic (congestive) heart failure: Secondary | ICD-10-CM

## 2021-03-26 DIAGNOSIS — M439 Deforming dorsopathy, unspecified: Secondary | ICD-10-CM | POA: Diagnosis not present

## 2021-03-26 DIAGNOSIS — E876 Hypokalemia: Secondary | ICD-10-CM | POA: Insufficient documentation

## 2021-03-26 DIAGNOSIS — R9389 Abnormal findings on diagnostic imaging of other specified body structures: Secondary | ICD-10-CM

## 2021-03-26 DIAGNOSIS — I5082 Biventricular heart failure: Secondary | ICD-10-CM | POA: Diagnosis not present

## 2021-03-26 DIAGNOSIS — Z7901 Long term (current) use of anticoagulants: Secondary | ICD-10-CM | POA: Diagnosis not present

## 2021-03-26 DIAGNOSIS — I959 Hypotension, unspecified: Secondary | ICD-10-CM | POA: Insufficient documentation

## 2021-03-26 DIAGNOSIS — N281 Cyst of kidney, acquired: Secondary | ICD-10-CM | POA: Diagnosis not present

## 2021-03-26 DIAGNOSIS — J90 Pleural effusion, not elsewhere classified: Secondary | ICD-10-CM | POA: Diagnosis not present

## 2021-03-26 DIAGNOSIS — K8020 Calculus of gallbladder without cholecystitis without obstruction: Secondary | ICD-10-CM | POA: Diagnosis not present

## 2021-03-26 LAB — BASIC METABOLIC PANEL
BUN/Creatinine Ratio: 14 (ref 10–24)
BUN: 13 mg/dL (ref 8–27)
CO2: 20 mmol/L (ref 20–29)
Calcium: 9.3 mg/dL (ref 8.6–10.2)
Chloride: 99 mmol/L (ref 96–106)
Creatinine, Ser: 0.91 mg/dL (ref 0.76–1.27)
Glucose: 95 mg/dL (ref 70–99)
Potassium: 4.5 mmol/L (ref 3.5–5.2)
Sodium: 135 mmol/L (ref 134–144)
eGFR: 94 mL/min/{1.73_m2} (ref >59)

## 2021-03-26 LAB — DIGOXIN LEVEL: Digoxin, Serum: 0.9 ng/mL (ref 0.5–0.9)

## 2021-03-26 IMAGING — MR MR ABDOMEN W/O CM
6 of 8 series · 28 of 48 positions shown · non-contrast
Comparison: [DATE] abdominal ultrasound

CLINICAL DATA: Abnormal ultrasound demonstrating a 2.1 cm area of
relative hypoechogenicity. Prior elevated liver function tests. No
history of cancer. Patient refused IV contrast.

EXAM:
MRI ABDOMEN WITHOUT CONTRAST
TECHNIQUE: Multiplanar multisequence MR imaging was performed without the
administration of intravenous contrast.

[Series 3: cor haste · coronal · 5.0mm · 0.68mm/px · 4 of 35 slices shown]
[im 1/35]
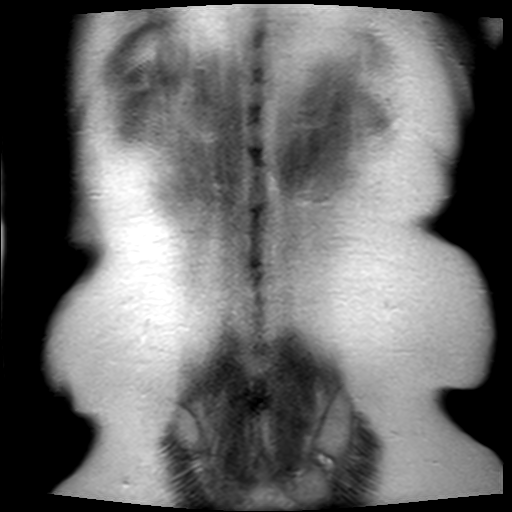
[im 12/35]
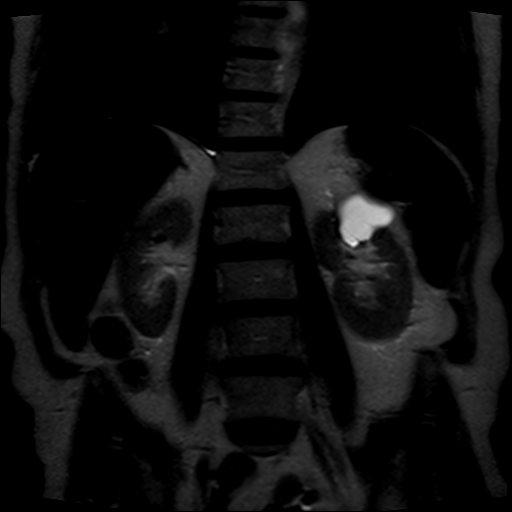
[im 23/35]
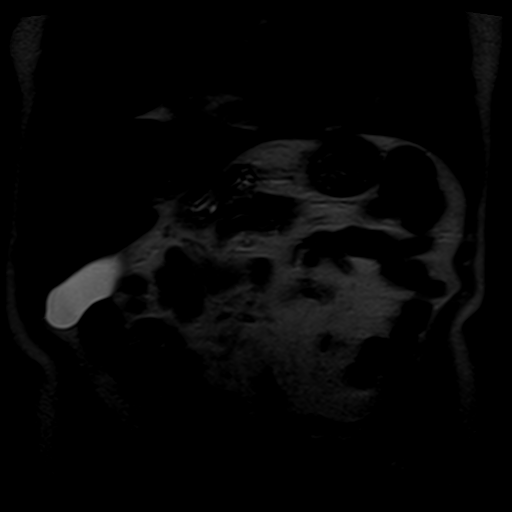
[im 35/35]
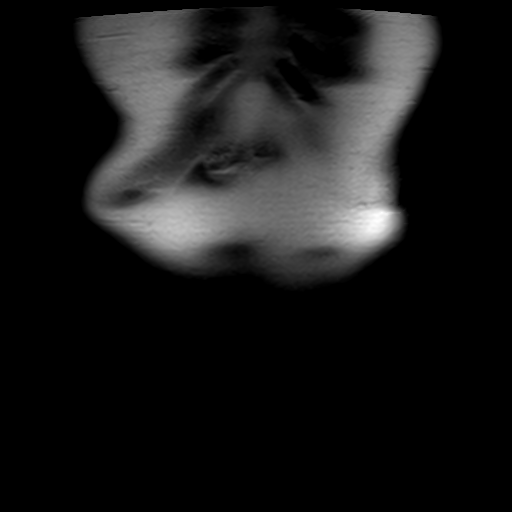

[Series 4: axial haste · axial · 6.0mm · 0.68mm/px · z∈[-164,+67]mm · 4 of 36 slices shown]
[im 1/36]
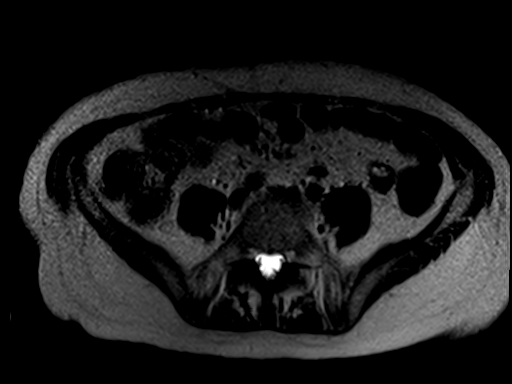
[im 12/36]
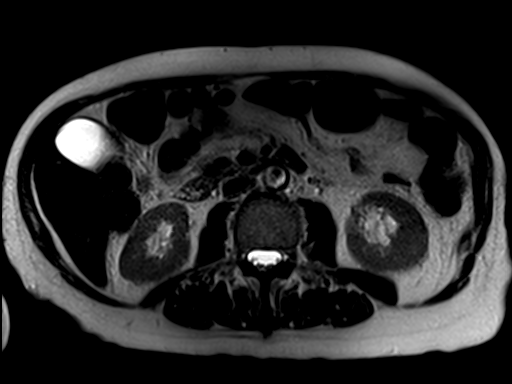
[im 24/36]
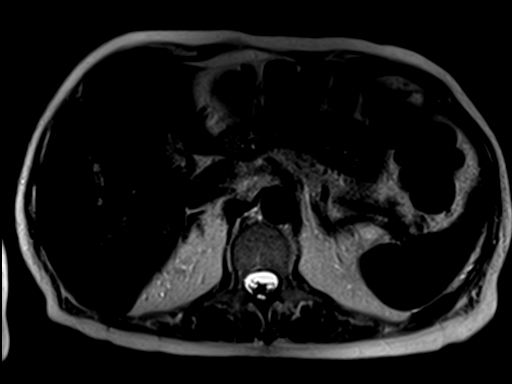
[im 36/36]
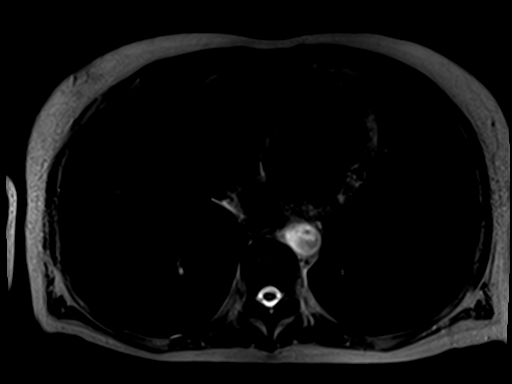

[Series 5: T1 · axial · 6.0mm · 0.68mm/px · z∈[-154,+57]mm · 7 of 66 slices shown]
[im 1/66]
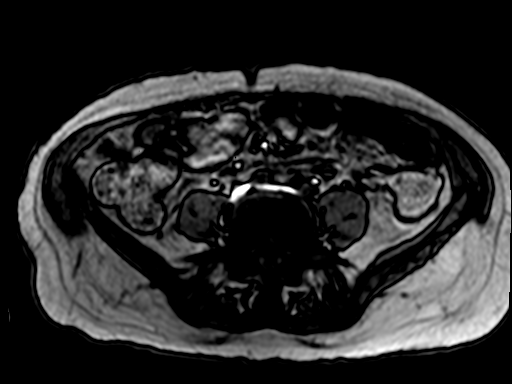
[im 11/66]
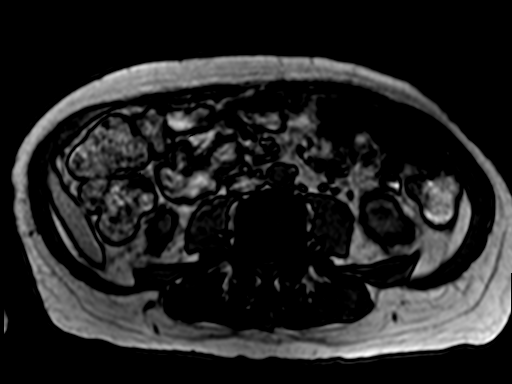
[im 22/66]
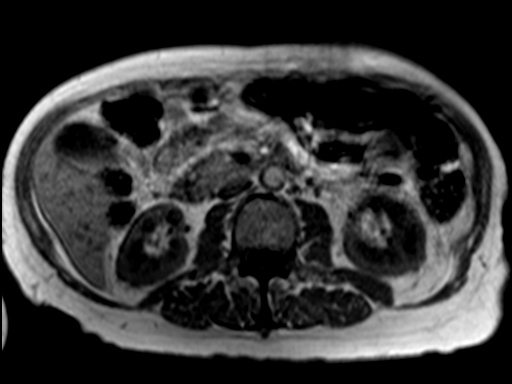
[im 33/66]
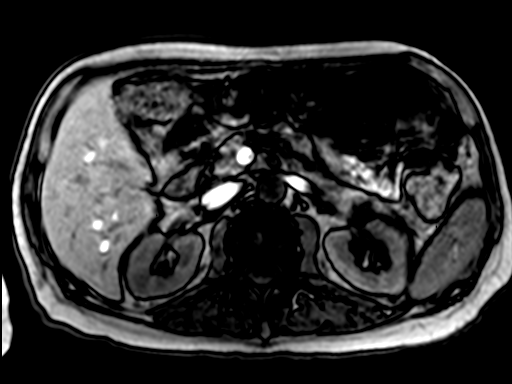
[im 44/66]
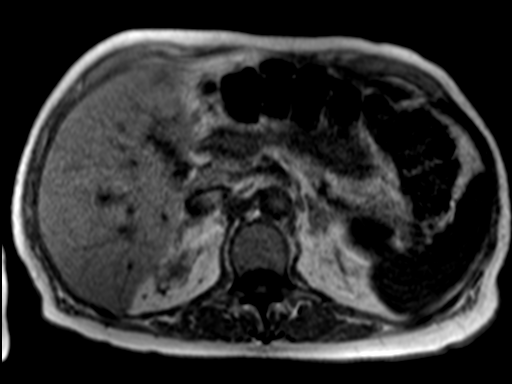
[im 55/66]
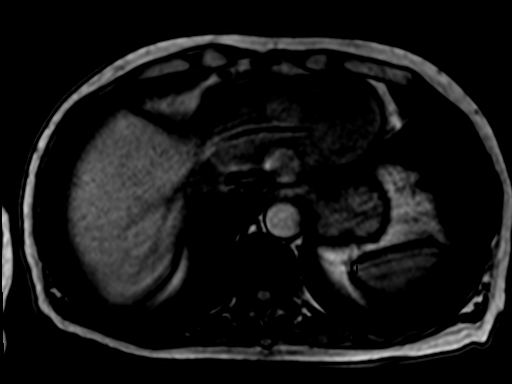
[im 66/66]
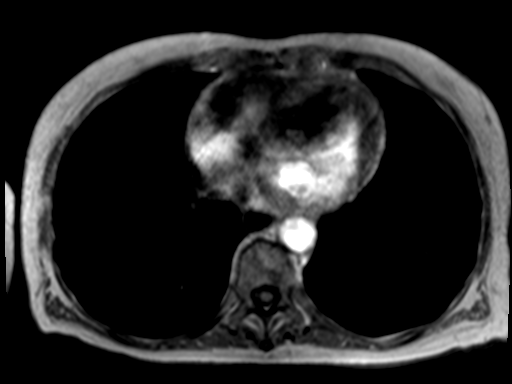

[Series 6: bSSFP · axial · 4.0mm · 0.68mm/px · z∈[-168,+71]mm · 7 of 61 slices shown]
[im 1/61]
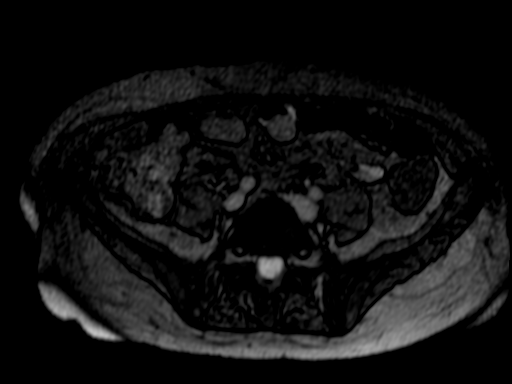
[im 11/61]
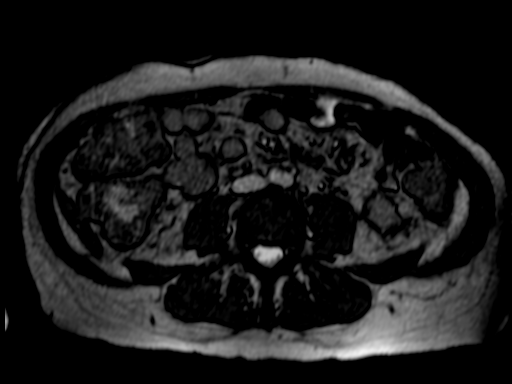
[im 21/61]
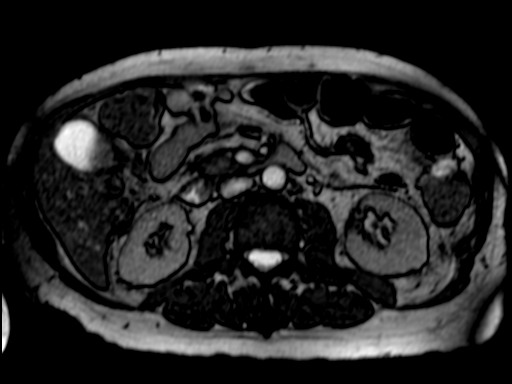
[im 31/61]
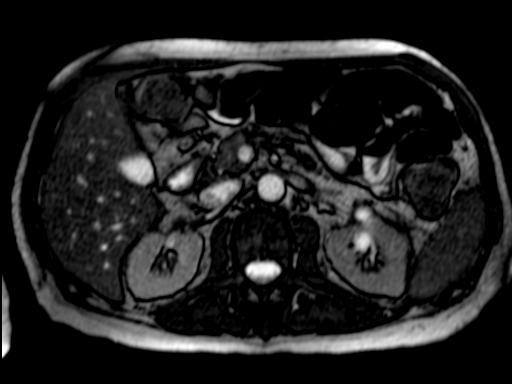
[im 41/61]
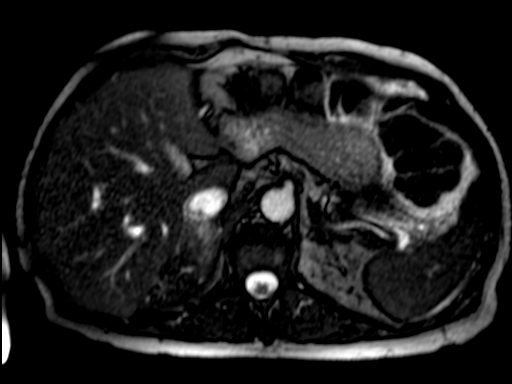
[im 51/61]
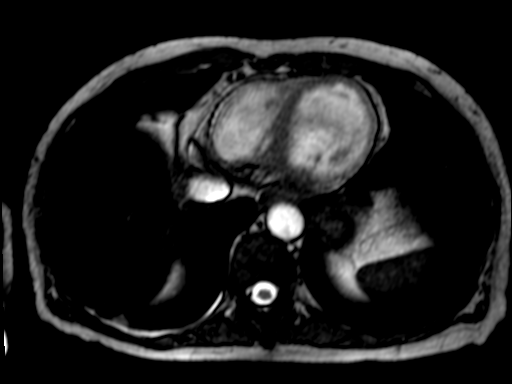
[im 61/61]
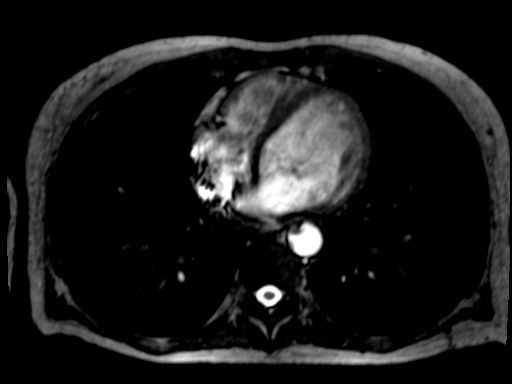

[Series 7: T2 fat-sat · axial · 6.0mm · 1.09mm/px · z∈[-152,+86]mm · 4 of 34 slices shown]
[im 1/34]
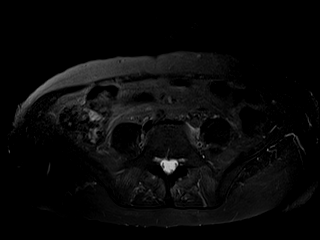
[im 12/34]
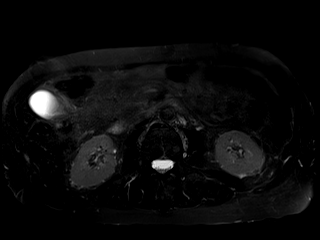
[im 23/34]
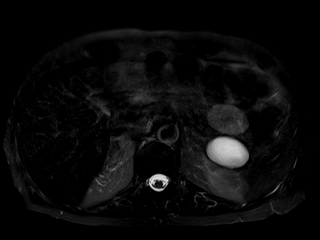
[im 34/34]
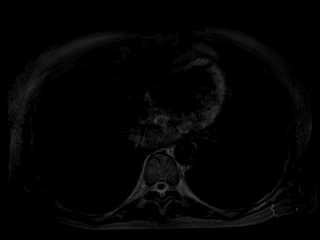

[Series 8: [id number] · axial · 6.0mm · 1.82mm/px · z∈[-145,-123]mm · 2 of 96 slices shown]
[im 1/96]
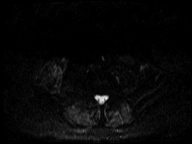
[im 11/96]
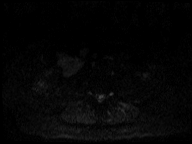

[28 of 48 positions shown; findings below may reference images not displayed]

FINDINGS: Mild limitations secondary to lack of IV contrast and respiratory
motion involving the axial T2 fat sat images.

Lower chest: Mild cardiomegaly, without pericardial or pleural
effusion.

Hepatobiliary: Normal noncontrast appearance of the liver. No
correlate for the ultrasound abnormality. No restricted diffusion
within the liver. No steatosis.

1.5 cm gallstone without acute cholecystitis or biliary duct
dilatation.

Pancreas:  Normal, without mass or ductal dilatation.

Spleen: Signal dropout on long TE imaging, suggesting iron
deposition within. No splenomegaly.

Adrenals/Urinary Tract: Normal adrenal glands and right kidney.
Upper pole left renal 4.9 cm cyst. No hydronephrosis.

Stomach/Bowel: Proximal gastric underdistention. Apparent wall
thickening may be secondary. No significant wall thickening on
[DATE] chest CT.

Normal small bowel caliber. Colonic stool burden suggests
constipation.

Vascular/Lymphatic: Normal aortic caliber. No retroperitoneal or
retrocrural adenopathy.

Other:  No ascites.

Musculoskeletal: Minimal convex right thoracic spine curvature.
IMPRESSION: 1. No evidence of hepatic mass, given mild above limitations. The
ultrasound abnormality was likely artifactual.
2.  No acute abdominal process.
3.  Possible constipation.

## 2021-03-26 MED ORDER — AMIODARONE HCL 200 MG PO TABS: 200.0000 mg | ORAL_TABLET | Freq: Two times a day (BID) | ORAL | 6 refills | Status: DC

## 2021-03-26 MED ORDER — SPIRONOLACTONE 25 MG PO TABS: 25.0000 mg | ORAL_TABLET | Freq: Every day | ORAL | 5 refills | Status: DC

## 2021-03-26 MED ORDER — MIDODRINE HCL 2.5 MG PO TABS: 2.5000 mg | ORAL_TABLET | Freq: Three times a day (TID) | ORAL | 3 refills | Status: DC

## 2021-03-26 MED ORDER — EMPAGLIFLOZIN 10 MG PO TABS: 10.0000 mg | ORAL_TABLET | Freq: Every day | ORAL | 5 refills | Status: DC

## 2021-03-26 MED ORDER — MEXILETINE HCL 200 MG PO CAPS: 200.0000 mg | ORAL_CAPSULE | Freq: Two times a day (BID) | ORAL | 6 refills | Status: DC

## 2021-03-26 NOTE — Patient Instructions (Addendum)
It was a pleasure seeing you today!  MEDICATIONS: -We are changing your medications today -Decrease midodrine to 2.5 mg (1 tablet) three times daily. You may go back up to 5 mg three times daily if your SBP < 95 mmHg.  -Call if you have questions about your medications.  NEXT APPOINTMENT: Return to clinic in 3 weeks with [facility].  In general, to take care of your heart failure: -Limit your fluid intake to 2 Liters (half-gallon) per day.   -Limit your salt intake to ideally 2-3 grams (2000-3000 mg) per day. -Weigh yourself daily and record, and bring that "weight diary" to your next appointment.  (Weight gain of 2-3 pounds in 1 day typically means fluid weight.) -The medications for your heart are to help your heart and help you live longer.   -Please contact us before stopping any of your heart medications.  Call the clinic at [phone] with questions or to reschedule future appointments.

## 2021-03-27 ENCOUNTER — Encounter: Payer: Self-pay | Admitting: Cardiovascular Disease

## 2021-03-27 ENCOUNTER — Other Ambulatory Visit (HOSPITAL_COMMUNITY): Payer: Self-pay | Admitting: *Deleted

## 2021-03-27 MED ORDER — DIGOXIN 125 MCG PO TABS: ORAL_TABLET | ORAL | 6 refills | Status: DC

## 2021-03-29 ENCOUNTER — Telehealth: Payer: Self-pay

## 2021-03-29 DIAGNOSIS — J439 Emphysema, unspecified: Secondary | ICD-10-CM | POA: Diagnosis not present

## 2021-03-29 DIAGNOSIS — Z7951 Long term (current) use of inhaled steroids: Secondary | ICD-10-CM | POA: Diagnosis not present

## 2021-03-29 DIAGNOSIS — I429 Cardiomyopathy, unspecified: Secondary | ICD-10-CM | POA: Diagnosis not present

## 2021-03-29 DIAGNOSIS — I5082 Biventricular heart failure: Secondary | ICD-10-CM | POA: Diagnosis not present

## 2021-03-29 DIAGNOSIS — I2510 Atherosclerotic heart disease of native coronary artery without angina pectoris: Secondary | ICD-10-CM | POA: Diagnosis not present

## 2021-03-29 DIAGNOSIS — M797 Fibromyalgia: Secondary | ICD-10-CM | POA: Diagnosis not present

## 2021-03-29 DIAGNOSIS — E871 Hypo-osmolality and hyponatremia: Secondary | ICD-10-CM | POA: Diagnosis not present

## 2021-03-29 DIAGNOSIS — E876 Hypokalemia: Secondary | ICD-10-CM | POA: Diagnosis not present

## 2021-03-29 DIAGNOSIS — I3139 Other pericardial effusion (noninflammatory): Secondary | ICD-10-CM | POA: Diagnosis not present

## 2021-03-29 DIAGNOSIS — E119 Type 2 diabetes mellitus without complications: Secondary | ICD-10-CM | POA: Diagnosis not present

## 2021-03-29 DIAGNOSIS — I4729 Other ventricular tachycardia: Secondary | ICD-10-CM | POA: Diagnosis not present

## 2021-03-29 DIAGNOSIS — I5022 Chronic systolic (congestive) heart failure: Secondary | ICD-10-CM | POA: Diagnosis not present

## 2021-03-29 DIAGNOSIS — I493 Ventricular premature depolarization: Secondary | ICD-10-CM | POA: Diagnosis not present

## 2021-03-29 DIAGNOSIS — R911 Solitary pulmonary nodule: Secondary | ICD-10-CM | POA: Diagnosis not present

## 2021-03-29 DIAGNOSIS — I110 Hypertensive heart disease with heart failure: Secondary | ICD-10-CM | POA: Diagnosis not present

## 2021-03-29 DIAGNOSIS — J90 Pleural effusion, not elsewhere classified: Secondary | ICD-10-CM | POA: Diagnosis not present

## 2021-03-29 NOTE — Telephone Encounter (Signed)
Received letter in the mail- patient is requesting to know if it is okay for him to get his 4th COVID injections.   I will route to MD/PHARMD to advise so I can notify patient.   Thank you!

## 2021-03-29 NOTE — Telephone Encounter (Signed)
Contacted patient, advised it was okay to do.  Patient verbalized understanding.

## 2021-03-29 NOTE — Telephone Encounter (Signed)
Yes should not be an issue

## 2021-03-31 DIAGNOSIS — I420 Dilated cardiomyopathy: Secondary | ICD-10-CM | POA: Diagnosis not present

## 2021-04-01 ENCOUNTER — Other Ambulatory Visit: Payer: Self-pay

## 2021-04-01 MED ORDER — DIGOXIN 125 MCG PO TABS: ORAL_TABLET | ORAL | 6 refills | Status: DC

## 2021-04-03 ENCOUNTER — Telehealth (HOSPITAL_COMMUNITY): Payer: Self-pay | Admitting: Pharmacist

## 2021-04-03 MED ORDER — MIDODRINE HCL 5 MG PO TABS: 5.0000 mg | ORAL_TABLET | Freq: Three times a day (TID) | ORAL | 3 refills | Status: DC

## 2021-04-03 NOTE — Telephone Encounter (Signed)
Patient called and reported that he has been feeling weak and having dizzy spells since the midodrine was decreased. Will increase midodrine back up to 5 mg TID. Patient expressed understanding.    Karle Plumber, PharmD, BCPS, BCCP, CPP Heart Failure Clinic Pharmacist [phone]

## 2021-04-05 ENCOUNTER — Telehealth: Payer: Self-pay | Admitting: *Deleted

## 2021-04-05 NOTE — Telephone Encounter (Signed)
-----   Message from Traci R Turner, MD sent at 03/21/2021  4:35 PM EST ----- °Normal home sleep study so in lab PSG will be ordered  °

## 2021-04-05 NOTE — Telephone Encounter (Signed)
Left message to return a call to discuss HST results. 

## 2021-04-08 NOTE — Telephone Encounter (Signed)
-----   Message from Quintella Reichert, MD sent at 03/21/2021  4:35 PM EST ----- Normal home sleep study so in lab PSG will be ordered

## 2021-04-08 NOTE — Telephone Encounter (Signed)
Patient returned a call to me and he was given his HST results and recommendations. He declines to repeat the study in lab unless Dr Mayford Knife can give him a medical reason he needs to repeat the study in lab study. He currently has too many out of pocket expenses. He has done a lot of research on OSA and does not feel that he has it.

## 2021-04-12 NOTE — Progress Notes (Addendum)
? ?ADVANCED HF CLINIC NOTE ? ? ?Primary Care: Kathyrn Lass, MD ?Cardiology: Dr. Claiborne Billings ?HF Cardiologist: Dr. Haroldine Laws ? ?HPI: ?Erik Chan is a 66 y.o. male with tobacco abuse and COPD, and new diagnosis of systolic heart failure/NICM. ? ?Admitted 1/23 with new acute HFrEF. Echo showed  EF < 20%. PICC line placed for CVP and co-ox monitoring. Initial co-ox 52%. Started on milrinone. L/RHC on milrinone showed mild CAD, RA 15, PA mean 35, PCW 29 (v 35-40), Fick CO/CI 6.3/2.9. Had > 20% PVC burden with runs of NSVT. Started on IV amio and mexiletine, later switched to amio 200 bid and LifeVest placed. Diuresed with lasix gtt, weight down total of 50 lb during admission. Milrinone weaned off with Co-ox stable at 64.8%.  CT chest showed no sarcoidosis, but moderate right pleural effusion. Underwent right thoracentesis with removal 1200 cc fluid. Underwent cMRI showing LVEF 10%, basal septal wall LGE (mid-wall), apical subendocardial LGE, RV insertion nonspecific scar pattern, RV normal size with severe dysfunction (EF 19%). SPEP/UPEP and multiple myeloma panel negative.  CM etiology include isolated cardiac sarcoidosis vs myocarditis vs genetic cardiomyopathy +/- PVCs. Consider genetic testing and cardiac PET as outpatient. GDMT limited by low BP, but able to start spiro, dig and Jardiance. Discharge weight 173 lbs. ? ?Hospital follow up feeling well with NYHA II symptoms, midodrine decreased.  ? ?Home sleep study 2/23 showed no OSA ? ?Today he returns for HF follow up. Overall feeling fine. He is not SOB walking inside or walking 500 ft (up incline of driveway) to garbage cans.  Chronically sleeps on 3 pillows. Denies abnormal bleeding, palpitations, CP, dizziness, edema. Appetite ok. No fever or chills. Weight at home 158 lbs. Taking all medications. BP at home 114/70s. He tried lowering midodrine to 2.5 but BP dropped. No issues with LifeVest. 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 Lives at home with  wife, who has dementia.  ? ?Cardiac Studies: ?- Echo (1/23): EF <20%, RV severely reduced, LA/RA severley dilated ? ?- R/LHC (1/23): on milrinone 0.25, moderate non-obs CAD. ?Ao = 95/65 (79) ?LV = 91/29 ?RA = 15 ?RV = 54/17 ?PA = 60/24 (35) ?PCW = 29 (v = 35-40) ?Fick cardiac output/index = 6.3/2.9 ?PVR = 0.95 WU ?SVR = 904 ?FA sat = 97% ?PA sat = 68%, 72% ? ?- cMRI (1/23): LVEF 10%, basal septal wall LGE (mid-wall), apical subendocardial LGE, RV insertion nonspecific scar pattern, RV normal size with severe dysfunction (EF 19%), small pericardial effusion, and moderate R pleural effusion.  ?  ? ?Past Medical History:  ?Diagnosis Date  ? CHF (congestive heart failure) ([place]) 02/13/2021  ? LVEF less than 20%  ? COPD (chronic obstructive pulmonary disease) ([place])   ? Fibromyalgia   ? ?Current Outpatient Medications  ?Medication Sig Dispense Refill  ? acetaminophen (TYLENOL) 500 MG tablet Take 500 mg by mouth every 6 (six) hours as needed for mild pain, fever or headache.    ? albuterol (VENTOLIN HFA) 108 (90 Base) MCG/ACT inhaler Inhale 1-2 puffs into the lungs every 4 (four) hours as needed for wheezing or shortness of breath.    ? amiodarone (PACERONE) 200 MG tablet Take 1 tablet (200 mg total) by mouth 2 (two) times daily. 60 tablet 6  ? digoxin (LANOXIN) 0.125 MG tablet TAKE 0.125 MG  (1 TABLET) ON EVEN DAYS AND TAKE 1/2 TABLET(0.0625 MG) ON ODD DAYS OF THE MONTH 30 tablet 6  ? empagliflozin (JARDIANCE) 10 MG TABS tablet Take 1 tablet (10 mg  total) by mouth daily. 30 tablet 5  ? mexiletine (MEXITIL) 200 MG capsule Take 1 capsule (200 mg total) by mouth every 12 (twelve) hours. 60 capsule 6  ? spironolactone (ALDACTONE) 25 MG tablet Take 1 tablet (25 mg total) by mouth daily. 30 tablet 5  ? midodrine (PROAMATINE) 5 MG tablet Take 1 tablet (5 mg total) by mouth 3 (three) times daily with meals. 90 tablet 4  ? ?No current facility-administered medications for this encounter.  ? ?Allergies  ?Allergen Reactions  ?  Shellfish-Derived Products Hives and Rash  ? ?Social History  ? ?Socioeconomic History  ? Marital status: Married  ?  Spouse name: Not on file  ? Number of children: Not on file  ? Years of education: Not on file  ? Highest education level: Not on file  ?Occupational History  ? Occupation: Customer service manager   ?Tobacco Use  ? Smoking status: Former  ?  Packs/day: 1.00  ?  Years: 30.00  ?  Pack years: 30.00  ?  Types: Pipe, Cigarettes  ? Smokeless tobacco: Never  ?Vaping Use  ? Vaping Use: Never used  ?Substance and Sexual Activity  ? Alcohol use: No  ?  Alcohol/week: 0.0 standard drinks  ? Drug use: No  ? Sexual activity: Not on file  ?Other Topics Concern  ? Not on file  ?Social History Narrative  ? Not on file  ? ?Social Determinants of Health  ? ?Financial Resource Strain: Not on file  ?Food Insecurity: Not on file  ?Transportation Needs: Not on file  ?Physical Activity: Not on file  ?Stress: Not on file  ?Social Connections: Not on file  ?Intimate Partner Violence: Not on file  ? ?Family History  ?Problem Relation Age of Onset  ? Asthma Mother   ? Allergies Mother   ? Fibromyalgia Mother   ? Osteoarthritis Mother   ? Heart Problems Father   ? Osteoarthritis Maternal Grandmother   ? ?BP 108/76 (BP Location: Left Arm)   Pulse 64   Wt 74.3 kg (163 lb 12.8 oz)   SpO2 95%   BMI 21.03 kg/m?  ? ?Wt Readings from Last 3 Encounters:  ?04/15/21 74.3 kg (163 lb 12.8 oz)  ?03/26/21 76 kg (167 lb 9.6 oz)  ?03/20/21 77.2 kg (170 lb 3.2 oz)  ? ?PHYSICAL EXAM: ?General:  NAD. No resp difficulty, thin, arrived in [facility] ?HEENT: Normal ?Neck: Supple. No JVD. Carotids 2+ bilat; no bruits. No lymphadenopathy or thryomegaly appreciated. ?Cor: PMI nondisplaced. Regular rate & rhythm. No rubs, gallops or murmurs. LifeVest on ?Lungs: Diminished in bases ?Abdomen: Soft, nontender, nondistended. No hepatosplenomegaly. No bruits or masses. Good bowel sounds. ?Extremities: No cyanosis, clubbing, rash, edema ?Neuro: Alert & oriented x 3, cranial nerves  grossly intact. Moves all 4 extremities w/o difficulty. Affect pleasant. ? ?ASSESSMENT & PLAN: ?1. Chronic Biventricular HFrEF/ NICM ?- Echo (1/23): severely reduced EF < 20% and severely reduced RV. Moderate to severe MR.  ?- L/RHC (1/23): moderate non-obstructive CAD. EF < 10% ?- cMRI (1/23): with severe biventricular heart failure with basal septal wall mid-wall LGE, apical inferior subendocardial LGE, and RV insertion non specific scar pattern.  ?- CT chest (1/23): without evidence for pulmonary sarcoidosis.   ?- Myeloma panel and SPEP/UPEP negative. ACE level OK (37). ?- Etiology for CM => isolated cardiac sarcoidosis vs prior myocarditis vs genetic cardiomyopathy, +/- PVCs ?- Cardiac PET ordered, and genetic testing sent 03/04/21. ?- Stable NYHA II, not very physically active. Volume status stable. Does not need  lasix. GDMT limited by low BP. ?- Continue midodrine 5 mg tid. Instructed to take tid. ?- No BP room to add losartan. ?- Continue spironolactone 25 mg daily.  ?- Continue digoxin 0.125 mg daily alternating with 0.0625 every other day. ?- Continue Jardiance 10 mg daily. ?- No ? blocker w/ recent low output and low BP.  ?- Plan to repeat Echo next visit, if EF not improving may need to consider advanced HF therapies.  ?- Check BMET, dig level today. ?  ?2. PVCs/NSVT ?- >20% PVC burden on admit.   ?- Continue mexiletine 200 mg bid. ?- Continue amiodarone 200 mg bid for now.  ?- No OSA on sleep study 2/23. ?- Continue LifeVest.  ?- Check TSH and LFTs next visit. ?  ?3. COPD ?- Former heavy smoker. Quit smoking 01/07/21. ?  ?4. H/o Pleural Effusion ?- s/p rt thoracentesis 1/23 w/ 1200 cc fluid removal. ? ?5. DM2: ?- A1c 6.7 (1/23). ?- On SGLT2i. ? ?Follow up in 6 weeks with Dr. Haroldine Laws + echo as scheduled. ? ?Allena Katz, FNP-BC ?04/15/21 ?

## 2021-04-15 ENCOUNTER — Other Ambulatory Visit: Payer: Self-pay

## 2021-04-15 ENCOUNTER — Encounter (HOSPITAL_COMMUNITY): Payer: Self-pay

## 2021-04-15 ENCOUNTER — Ambulatory Visit (HOSPITAL_COMMUNITY)
Admission: RE | Admit: 2021-04-15 | Discharge: 2021-04-15 | Disposition: A | Discharge status: Home / Self Care | Payer: Medicare Other | Source: Ambulatory Visit | Attending: Family Medicine | Admitting: Family Medicine

## 2021-04-15 VITALS — BP 108/76 | HR 64 | Wt 163.8 lb

## 2021-04-15 DIAGNOSIS — I3139 Other pericardial effusion (noninflammatory): Secondary | ICD-10-CM | POA: Diagnosis not present

## 2021-04-15 DIAGNOSIS — Z09 Encounter for follow-up examination after completed treatment for conditions other than malignant neoplasm: Secondary | ICD-10-CM | POA: Insufficient documentation

## 2021-04-15 DIAGNOSIS — I2510 Atherosclerotic heart disease of native coronary artery without angina pectoris: Secondary | ICD-10-CM | POA: Insufficient documentation

## 2021-04-15 DIAGNOSIS — I5082 Biventricular heart failure: Secondary | ICD-10-CM | POA: Diagnosis not present

## 2021-04-15 DIAGNOSIS — I472 Ventricular tachycardia, unspecified: Secondary | ICD-10-CM | POA: Diagnosis not present

## 2021-04-15 DIAGNOSIS — L905 Scar conditions and fibrosis of skin: Secondary | ICD-10-CM | POA: Insufficient documentation

## 2021-04-15 DIAGNOSIS — J90 Pleural effusion, not elsewhere classified: Secondary | ICD-10-CM | POA: Diagnosis not present

## 2021-04-15 DIAGNOSIS — E119 Type 2 diabetes mellitus without complications: Secondary | ICD-10-CM | POA: Insufficient documentation

## 2021-04-15 DIAGNOSIS — I428 Other cardiomyopathies: Secondary | ICD-10-CM | POA: Diagnosis not present

## 2021-04-15 DIAGNOSIS — Z7984 Long term (current) use of oral hypoglycemic drugs: Secondary | ICD-10-CM | POA: Diagnosis not present

## 2021-04-15 DIAGNOSIS — I493 Ventricular premature depolarization: Secondary | ICD-10-CM

## 2021-04-15 DIAGNOSIS — Z9889 Other specified postprocedural states: Secondary | ICD-10-CM | POA: Insufficient documentation

## 2021-04-15 DIAGNOSIS — I4729 Other ventricular tachycardia: Secondary | ICD-10-CM

## 2021-04-15 DIAGNOSIS — Z79899 Other long term (current) drug therapy: Secondary | ICD-10-CM | POA: Insufficient documentation

## 2021-04-15 DIAGNOSIS — I5022 Chronic systolic (congestive) heart failure: Secondary | ICD-10-CM | POA: Diagnosis not present

## 2021-04-15 DIAGNOSIS — Z87891 Personal history of nicotine dependence: Secondary | ICD-10-CM | POA: Insufficient documentation

## 2021-04-15 DIAGNOSIS — J449 Chronic obstructive pulmonary disease, unspecified: Secondary | ICD-10-CM

## 2021-04-15 DIAGNOSIS — E1159 Type 2 diabetes mellitus with other circulatory complications: Secondary | ICD-10-CM

## 2021-04-15 LAB — BASIC METABOLIC PANEL
Anion gap: 9 (ref 5–15)
BUN: 15 mg/dL (ref 8–23)
CO2: 22 mmol/L (ref 22–32)
Calcium: 8.9 mg/dL (ref 8.9–10.3)
Chloride: 103 mmol/L (ref 98–111)
Creatinine, Ser: 0.99 mg/dL (ref 0.61–1.24)
GFR, Estimated: >60 mL/min (ref >60)
Glucose, Bld: 100 mg/dL — ABNORMAL HIGH (ref 70–99)
Potassium: 4.1 mmol/L (ref 3.5–5.1)
Sodium: 134 mmol/L — ABNORMAL LOW (ref 135–145)

## 2021-04-15 LAB — DIGOXIN LEVEL: Digoxin Level: 1.3 ng/mL (ref 0.8–2.0)

## 2021-04-15 MED ORDER — MIDODRINE HCL 5 MG PO TABS: 5.0000 mg | ORAL_TABLET | Freq: Three times a day (TID) | ORAL | 4 refills | Status: DC

## 2021-04-15 NOTE — Patient Instructions (Signed)
Thank you for coming in today ? ?Labs were done today, if any labs are abnormal the clinic will call you ?No news is good news ? ?INCREASE Midodrine 5 mg 1 tablet three times daily ? ? ?Your physician recommends that you schedule a follow-up appointment in:  ?Please keep follow up with Dr. Haroldine Laws ? ?KEEP check on blood pressure and notifiy clinic if systolic(top number) is less than 100 ? ?At the [facility], you and your health needs are our priority. As part of our continuing mission to provide you with exceptional heart care, we have created designated Provider Care Teams. These Care Teams include your primary Cardiologist (physician) and Advanced Practice Providers (APPs- Physician Assistants and Nurse Practitioners) who all work together to provide you with the care you need, when you need it.  ? ?You may see any of the following providers on your designated Care Team at your next follow up: ?Dr [name] ?Dr Loralie Champagne ?Darrick Grinder, NP ?Lyda Jester, PA ?Jessica Milford,NP ?Marlyce Huge, PA ?Audry Riles, PharmD ? ? ?Please be sure to bring in all your medications bottles to every appointment.  ? ?If you have any questions or concerns before your next appointment please send [place] a message through [place] or call our office at [phone].   ? ?TO LEAVE A MESSAGE FOR THE NURSE SELECT OPTION 2, PLEASE LEAVE A MESSAGE INCLUDING: ?YOUR NAME ?DATE OF BIRTH ?CALL BACK NUMBER ?REASON FOR CALL**this is important as we prioritize the call backs ? ?YOU WILL RECEIVE A CALL BACK THE SAME DAY AS LONG AS YOU CALL BEFORE 4:00 PM ? ? ?

## 2021-04-16 ENCOUNTER — Telehealth (HOSPITAL_COMMUNITY): Payer: Self-pay | Admitting: Surgery

## 2021-04-16 NOTE — Telephone Encounter (Signed)
-----   Message from Jacklynn Ganong, [place] sent at 04/15/2021  4:54 PM EST ----- ?Dig level elevated. Stop digoxin. ? ?Other labs ok ?

## 2021-04-16 NOTE — Telephone Encounter (Signed)
I reviewed results and recommendations per provider.  He had many questions and concerns related to stopping Digoxin as the dosage was recently adjusted per Dr. Tresa Endo.  I reassured that I would share his concerns with Prince Rome NP.  CHL medication list updated.  ?

## 2021-04-17 ENCOUNTER — Encounter (HOSPITAL_COMMUNITY): Payer: Self-pay

## 2021-04-17 ENCOUNTER — Telehealth (HOSPITAL_COMMUNITY): Payer: Self-pay

## 2021-04-17 NOTE — Telephone Encounter (Signed)
Attempted to call patient in regards to Cardiac Rehab - LM on VM Mailed letter 

## 2021-04-18 ENCOUNTER — Telehealth (HOSPITAL_COMMUNITY): Payer: Self-pay

## 2021-04-18 ENCOUNTER — Other Ambulatory Visit (HOSPITAL_COMMUNITY): Payer: Self-pay

## 2021-04-18 NOTE — Telephone Encounter (Signed)
Pt returned cardiac rehab phone call and he stated that he has a lot going on right now and is not going to be able to do cardiac rehab. ?Closed referral. ?

## 2021-04-25 ENCOUNTER — Telehealth (HOSPITAL_COMMUNITY): Payer: Self-pay | Admitting: *Deleted

## 2021-04-25 NOTE — Telephone Encounter (Signed)
Pt left vm asking if he needed to keep wearing the life vest and if so for how long. ? ?Routed to [employer]  ?

## 2021-04-26 NOTE — Telephone Encounter (Signed)
Pt aware and agreeable with plan.  

## 2021-04-28 DIAGNOSIS — I420 Dilated cardiomyopathy: Secondary | ICD-10-CM | POA: Diagnosis not present

## 2021-05-24 ENCOUNTER — Telehealth (HOSPITAL_COMMUNITY): Payer: Self-pay

## 2021-05-24 NOTE — Telephone Encounter (Signed)
Faxed [facility] for PET SCAN on 05/24/2021 @ 15.30pm ?

## 2021-05-30 ENCOUNTER — Ambulatory Visit (HOSPITAL_BASED_OUTPATIENT_CLINIC_OR_DEPARTMENT_OTHER)
Admission: RE | Admit: 2021-05-30 | Discharge: 2021-05-30 | Disposition: A | Discharge status: Home / Self Care | Payer: Medicare Other | Source: Ambulatory Visit | Attending: Internal Medicine | Admitting: Internal Medicine

## 2021-05-30 ENCOUNTER — Ambulatory Visit (HOSPITAL_COMMUNITY)
Admission: RE | Admit: 2021-05-30 | Discharge: 2021-05-30 | Disposition: A | Discharge status: Home / Self Care | Payer: Medicare Other | Source: Ambulatory Visit | Attending: Family Medicine | Admitting: Family Medicine

## 2021-05-30 ENCOUNTER — Encounter (HOSPITAL_COMMUNITY): Payer: Self-pay | Admitting: Internal Medicine

## 2021-05-30 VITALS — BP 112/70 | HR 71 | Wt 164.4 lb

## 2021-05-30 DIAGNOSIS — F17200 Nicotine dependence, unspecified, uncomplicated: Secondary | ICD-10-CM | POA: Diagnosis not present

## 2021-05-30 DIAGNOSIS — I5021 Acute systolic (congestive) heart failure: Secondary | ICD-10-CM

## 2021-05-30 DIAGNOSIS — E1159 Type 2 diabetes mellitus with other circulatory complications: Secondary | ICD-10-CM | POA: Diagnosis not present

## 2021-05-30 DIAGNOSIS — I080 Rheumatic disorders of both mitral and aortic valves: Secondary | ICD-10-CM | POA: Diagnosis not present

## 2021-05-30 DIAGNOSIS — I5022 Chronic systolic (congestive) heart failure: Secondary | ICD-10-CM | POA: Diagnosis not present

## 2021-05-30 DIAGNOSIS — I493 Ventricular premature depolarization: Secondary | ICD-10-CM

## 2021-05-30 DIAGNOSIS — I509 Heart failure, unspecified: Secondary | ICD-10-CM | POA: Diagnosis present

## 2021-05-30 LAB — BASIC METABOLIC PANEL
Anion gap: 10 (ref 5–15)
BUN: 14 mg/dL (ref 8–23)
CO2: 24 mmol/L (ref 22–32)
Calcium: 9.1 mg/dL (ref 8.9–10.3)
Chloride: 104 mmol/L (ref 98–111)
Creatinine, Ser: 1.16 mg/dL (ref 0.61–1.24)
GFR, Estimated: >60 mL/min (ref >60)
Glucose, Bld: 91 mg/dL (ref 70–99)
Potassium: 4.1 mmol/L (ref 3.5–5.1)
Sodium: 138 mmol/L (ref 135–145)

## 2021-05-30 LAB — ECHOCARDIOGRAM COMPLETE
AR max vel: 2.79 cm2
AV Peak grad: 4.2 mmHg
Ao pk vel: 1.03 m/s
Area-P 1/2: 3.87 cm2
Calc EF: 21.6 %
MV M vel: 3.74 m/s
MV Peak grad: 56.1 mmHg
P 1/2 time: 505 msec
S' Lateral: 5.3 cm
Single Plane A2C EF: 16.8 %
Single Plane A4C EF: 24 %

## 2021-05-30 MED ORDER — AMIODARONE HCL 200 MG PO TABS: 200.0000 mg | ORAL_TABLET | Freq: Every day | ORAL | 3 refills | Status: DC

## 2021-05-30 NOTE — Patient Instructions (Signed)
Medication Changes: ? ?Decrease Amiodarone to 200mg  daily ? ?Lab Work: ? ?Labs done today, your results will be available in MyChart, we will contact you for abnormal readings. ? ? ?Testing/Procedures: ? ?none ? ?Referrals: ? ?Referred to Electrophysiologist. They will call you to arrange the appointments  ? ?Special Instructions // Education: ? ?none ? ?Follow-Up in: 8 weeks ? ?At the [facility], you and your health needs are our priority. We have a designated team specialized in the treatment of Heart Failure. This Care Team includes your primary Heart Failure Specialized Cardiologist (physician), Advanced Practice Providers (APPs- Physician Assistants and Nurse Practitioners), and Pharmacist who all work together to provide you with the care you need, when you need it.  ? ?You may see any of the following providers on your designated Care Team at your next follow up: ? ?Dr ?Dr Arvilla Meres ?Marca Ancona, NP ?Tonye Becket, PA ?Jessica Milford,NP ?Robbie Lis, PA ?Anna Genre, PharmD ? ? ?Please be sure to bring in all your medications bottles to every appointment.  ? ?Need to Contact Karle Plumber: ? ?If you have any questions or concerns before your next appointment please send [place] a message through [place] or call our office at [phone].   ? ?TO LEAVE A MESSAGE FOR THE NURSE SELECT OPTION 2, PLEASE LEAVE A MESSAGE INCLUDING: ?YOUR NAME ?DATE OF BIRTH ?CALL BACK NUMBER ?REASON FOR CALL**this is important as we prioritize the call backs ? ?YOU WILL RECEIVE A CALL BACK THE SAME DAY AS LONG AS YOU CALL BEFORE 4:00 PM ? ? ?

## 2021-05-30 NOTE — Progress Notes (Signed)
? ?ADVANCED HF CLINIC NOTE ? ? ?Primary Care: Kathyrn Lass, MD ?Cardiology: Dr. Claiborne Billings ?HF Cardiologist: Dr. Haroldine Laws ? ?HPI: ?Erik Chan is a 66 y.o. male with tobacco abuse and COPD, and new diagnosis of systolic heart failure/NICM. ? ?Admitted 1/23 with new acute HFrEF. Echo showed  EF < 20%. PICC line placed for CVP and co-ox monitoring. Initial co-ox 52%. Started on milrinone. L/RHC on milrinone showed mild CAD, RA 15, PA mean 35, PCW 29 (v 35-40), Fick CO/CI 6.3/2.9. Had > 20% PVC burden with runs of NSVT. Started on IV amio and mexiletine, later switched to amio 200 bid and LifeVest placed. Diuresed with lasix gtt, weight down total of 50 lb during admission. Milrinone weaned off with Co-ox stable at 64.8%.  CT chest showed no sarcoidosis, but moderate right pleural effusion. Underwent right thoracentesis with removal 1200 cc fluid. Underwent cMRI showing LVEF 10%, basal septal wall LGE (mid-wall), apical subendocardial LGE, RV insertion nonspecific scar pattern, RV normal size with severe dysfunction (EF 19%). SPEP/UPEP and multiple myeloma panel negative.  CM etiology include isolated cardiac sarcoidosis vs myocarditis vs genetic cardiomyopathy +/- PVCs. Consider genetic testing and cardiac PET as outpatient. GDMT limited by low BP, but able to start spiro, dig and Jardiance. Discharge weight 173 lbs. ? ?Hospital follow up feeling well with NYHA II symptoms, midodrine decreased.  ? ?Home sleep study 2/23 showed no OSA ? ?Today he returns for HF follow up.He attempted to wean midodrine but SBP went down. Overall feeling fine. Denies SOB/PND/Orthopnea. Appetite ok. No fever or chills. Weight at home has been stable.  Walking 2 miles several days a week. Taking all medications. No issues with LifeVest. 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  ? ?Life Vest- NO events.  Average heart rate 71 bpm Steps 4122 ? ?Cardiac Studies: ?- Echo (1/23): EF <20%, RV  severely reduced, LA/RA severley dilated ? ?- R/LHC (1/23): on milrinone 0.25, moderate non-obs CAD. ?Ao = 95/65 (79) ?LV = 91/29 ?RA = 15 ?RV = 54/17 ?PA = 60/24 (35) ?PCW = 29 (v = 35-40) ?Fick cardiac output/index = 6.3/2.9 ?PVR = 0.95 WU ?SVR = 904 ?FA sat = 97% ?PA sat = 68%, 72% ? ?- cMRI (1/23): LVEF 10%, basal septal wall LGE (mid-wall), apical subendocardial LGE, RV insertion nonspecific scar pattern, RV normal size with severe dysfunction (EF 19%), small pericardial effusion, and moderate R pleural effusion.  ?  ? ?Past Medical History:  ?Diagnosis Date  ? CHF (congestive heart failure) ([place]) 02/13/2021  ? LVEF less than 20%  ? COPD (chronic obstructive pulmonary disease) ([place])   ? Fibromyalgia   ? ?Current Outpatient Medications  ?Medication Sig Dispense Refill  ? acetaminophen (TYLENOL) 500 MG tablet Take 500 mg by mouth every 6 (six) hours as needed for mild pain, fever or headache.    ? albuterol (VENTOLIN HFA) 108 (90 Base) MCG/ACT inhaler Inhale 1-2 puffs into the lungs every 4 (four) hours as needed for wheezing or shortness of breath.    ? amiodarone (PACERONE) 200 MG tablet Take 1 tablet (200 mg total) by mouth 2 (two) times daily. 60 tablet 6  ? empagliflozin (JARDIANCE) 10 MG TABS tablet Take 1 tablet (10 mg total) by mouth daily. 30 tablet 5  ? mexiletine (MEXITIL) 200 MG capsule Take 1 capsule (200 mg total) by mouth every 12 (twelve) hours. 60 capsule 6  ? midodrine (PROAMATINE) 5 MG tablet Take 1 tablet (5 mg  total) by mouth 3 (three) times daily with meals. 90 tablet 4  ? spironolactone (ALDACTONE) 25 MG tablet Take 1 tablet (25 mg total) by mouth daily. 30 tablet 5  ? ?No current facility-administered medications for this encounter.  ? ?Allergies  ?Allergen Reactions  ? Shellfish-Derived Products Hives and Rash  ? ?Social History  ? ?Socioeconomic History  ? Marital status: Married  ?  Spouse name: Not on file  ? Number of children: Not on file  ? Years of education: Not on file  ? Highest  education level: Not on file  ?Occupational History  ? Occupation: Customer service manager   ?Tobacco Use  ? Smoking status: Former  ?  Packs/day: 1.00  ?  Years: 30.00  ?  Pack years: 30.00  ?  Types: Pipe, Cigarettes  ? Smokeless tobacco: Never  ?Vaping Use  ? Vaping Use: Never used  ?Substance and Sexual Activity  ? Alcohol use: No  ?  Alcohol/week: 0.0 standard drinks  ? Drug use: No  ? Sexual activity: Not on file  ?Other Topics Concern  ? Not on file  ?Social History Narrative  ? Not on file  ? ?Social Determinants of Health  ? ?Financial Resource Strain: Not on file  ?Food Insecurity: Not on file  ?Transportation Needs: Not on file  ?Physical Activity: Not on file  ?Stress: Not on file  ?Social Connections: Not on file  ?Intimate Partner Violence: Not on file  ? ?Family History  ?Problem Relation Age of Onset  ? Asthma Mother   ? Allergies Mother   ? Fibromyalgia Mother   ? Osteoarthritis Mother   ? Heart Problems Father   ? Osteoarthritis Maternal Grandmother   ? ?BP 112/70   Pulse 71   Wt 74.6 kg (164 lb 6.4 oz)   SpO2 96%   BMI 21.11 kg/m?  ? ?Wt Readings from Last 3 Encounters:  ?05/30/21 74.6 kg (164 lb 6.4 oz)  ?04/15/21 74.3 kg (163 lb 12.8 oz)  ?03/26/21 76 kg (167 lb 9.6 oz)  ? ?PHYSICAL EXAM: ?General:  Well appearing. No resp difficulty. Wearing Life Vest.  ?HEENT: normal ?Neck: supple. no JVD. Carotids 2+ bilat; no bruits. No lymphadenopathy or thryomegaly appreciated. ?Cor: PMI nondisplaced. Regular rate & rhythm. No rubs, gallops or murmurs. ?Lungs: clear ?Abdomen: soft, nontender, nondistended. No hepatosplenomegaly. No bruits or masses. Good bowel sounds. ?Extremities: no cyanosis, clubbing, rash, edema ?Neuro: alert & orientedx3, cranial nerves grossly intact. moves all 4 extremities w/o difficulty. Affect pleasant ? ?ASSESSMENT & PLAN: ?1. Chronic Biventricular HFrEF/ NICM ?- Echo (1/23): severely reduced EF < 20% and severely reduced RV. Moderate to severe MR.  ?- L/RHC (1/23): moderate non-obstructive  CAD. EF < 10% ?- cMRI (1/23): LVEF 10% with severe biventricular heart failure with basal septal wall mid-wall LGE, apical inferior subendocardial LGE, and RV insertion non specific scar pattern.  ?- CT chest (1/23): without evidence for pulmonary sarcoidosis.   ?- Myeloma panel and SPEP/UPEP negative. ACE level OK (37). ?- Etiology for CM => isolated cardiac sarcoidosis vs prior myocarditis vs genetic cardiomyopathy, +/- PVCs ?- Cardiac PET is set up next week at [facility] He is not sure he needs it. Genetic testing sent 03/04/21. ?- Echo today 05/30/21 EF 20-25% RV severely reduced.  ?- Continue midodrine 5 mg tid.  ?- No BP room to add ARB. ?- Continue spironolactone 25 mg daily.  ?- Continue Jardiance 10 mg daily. ?- No ? blocker w/ recent low output and low BP.  ?  ?  2. PVCs/NSVT ?- >20% PVC burden on admit.   ?- Continue mexiletine 200 mg bid. ?- Cut back amio to 200 mg daily.  ?- No OSA on sleep study 2/23. ?- Continue LifeVest.  ?  ?3. COPD ?- Former heavy smoker. Quit smoking 01/07/21. ?  ?4. H/o Pleural Effusion ?- s/p rt thoracentesis 1/23 w/ 1200 cc fluid removal. ? ?5. DM2: ?- A1c 6.7 (1/23). ?- On SGLT2i. ? ?Echo EF 20-25%. Refer to to EP.  ?[name] Clegg NP-C ?3:28 PM ? ?05/30/21 ? ?Patient seen and examined with the above-signed Advanced Practice Provider and/or Housestaff. I personally reviewed laboratory data, imaging studies and relevant notes. I independently examined the patient and formulated the important aspects of the plan. I have edited the note to reflect any of my changes or salient points. I have personally discussed the plan with the patient and/or family. ? ?Feeling good. NYHA II. Volume status ok. Compliant with meds. On midodrine for BP support.   Echo today reviewed personally EF 20-25%. PVCs suppressed.  ? ?General:  Well appearing. No resp difficulty ?HEENT: normal ?Neck: supple. no JVD. Carotids 2+ bilat; no bruits. No lymphadenopathy or thryomegaly appreciated. ?Cor: PMI nondisplaced.  Regular rate & rhythm. No rubs, gallops or murmurs. Wearing LifeVest ?Lungs: mildly decreased throughout  ?Abdomen: soft, nontender, nondistended. No hepatosplenomegaly. No bruits or masses. Good bowel sounds

## 2021-06-03 ENCOUNTER — Telehealth (HOSPITAL_COMMUNITY): Payer: Self-pay | Admitting: *Deleted

## 2021-06-03 NOTE — Telephone Encounter (Signed)
Spoke with [employer] rep Rayfield Citizen as she was able to speak with billing through [place]. Patients co pay ($4400) is his 20% deductible via his co insurance. Billing notes state patient would like signed copy of "agreement". ? ?Rayfield Citizen would recommend having patient speak with billing department at [phone]. ?  ?

## 2021-06-03 NOTE — Telephone Encounter (Addendum)
Pt called the LV[employer] coordinator line and spoke with Rush Barer. Pt said his life vest out of pocket expense is $4400. Pt said he needs insurance Christena Flake and MD notes from 4/19-5/19. Pt gave his insurance member ID [id number]. Pt asked that office note be faxed to [phone] attn care management unit. I faxed office note but also forwarded to Chantel Jeffries,CMA to follow up with [employer] rep. Pt said this is an urgent matter and if not handled our office will hear from his lawyer.  ? ? ?

## 2021-06-05 NOTE — Telephone Encounter (Signed)
Reached out to patient to ensure zoll billing department reached out to iron out co insurance co payment ? ?Patient reports company received all that was needed at this time, information was sent to [employer]. Noting further needed at this time  ?

## 2021-06-28 DIAGNOSIS — I420 Dilated cardiomyopathy: Secondary | ICD-10-CM | POA: Diagnosis not present

## 2021-07-23 ENCOUNTER — Encounter (HOSPITAL_COMMUNITY): Payer: Medicare Other

## 2021-07-23 NOTE — Progress Notes (Incomplete)
ADVANCED HF CLINIC NOTE   Primary Care: Kathyrn Lass, MD Cardiology: Dr. Claiborne Billings HF Cardiologist: Dr. Haroldine Laws  HPI: Erik Chan is a 66 y.o. male with tobacco abuse and COPD, and new diagnosis of systolic heart failure/NICM.  Admitted 1/23 with new acute HFrEF. Echo showed  EF < 20%. PICC line placed for CVP and co-ox monitoring. Initial co-ox 52%. Started on milrinone. L/RHC on milrinone showed mild CAD, RA 15, PA mean 35, PCW 29 (v 35-40), Fick CO/CI 6.3/2.9. Had > 20% PVC burden with runs of NSVT. Started on IV amio and mexiletine, later switched to amio 200 bid and LifeVest placed. Diuresed with lasix gtt, weight down total of 50 lb during admission. Milrinone weaned off with Co-ox stable at 64.8%.  CT chest showed no sarcoidosis, but moderate right pleural effusion. Underwent right thoracentesis with removal 1200 cc fluid. Underwent cMRI showing LVEF 10%, basal septal wall LGE (mid-wall), apical subendocardial LGE, RV insertion nonspecific scar pattern, RV normal size with severe dysfunction (EF 19%). SPEP/UPEP and multiple myeloma panel negative.  CM etiology include isolated cardiac sarcoidosis vs myocarditis vs genetic cardiomyopathy +/- PVCs. Consider genetic testing and cardiac PET as outpatient. GDMT limited by low BP, but able to start spiro, dig and Jardiance. Discharge weight 173 lbs.  Hospital follow up feeling well with NYHA II symptoms, midodrine decreased.   Home sleep study 2/23 showed no OSA  Today he returns for HF follow up.He attempted to wean midodrine but SBP went down. Overall feeling fine. Denies SOB/PND/Orthopnea. Appetite ok. No fever or chills. Weight at home has been stable.  Walking 2 miles several days a week. Taking all medications. No issues with LifeVest. 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 Lives at home with wife, who has dementia.   Life Vest- NO events.  Average heart rate 71 bpm Steps 4122  Cardiac Studies: - Echo (1/23): EF <20%, RV  severely reduced, LA/RA severley dilated  - R/LHC (1/23): on milrinone 0.25, moderate non-obs CAD. Ao = 95/65 (79) LV = 91/29 RA = 15 RV = 54/17 PA = 60/24 (35) PCW = 29 (v = 35-40) Fick cardiac output/index = 6.3/2.9 PVR = 0.95 WU SVR = 904 FA sat = 97% PA sat = 68%, 72%  - cMRI (1/23): LVEF 10%, basal septal wall LGE (mid-wall), apical subendocardial LGE, RV insertion nonspecific scar pattern, RV normal size with severe dysfunction (EF 19%), small pericardial effusion, and moderate R pleural effusion.     Past Medical History:  Diagnosis Date   CHF (congestive heart failure) ([facility]) 02/13/2021   LVEF less than 20%   COPD (chronic obstructive pulmonary disease) ([facility])    Fibromyalgia    Current Outpatient Medications  Medication Sig Dispense Refill   acetaminophen (TYLENOL) 500 MG tablet Take 500 mg by mouth every 6 (six) hours as needed for mild pain, fever or headache.     albuterol (VENTOLIN HFA) 108 (90 Base) MCG/ACT inhaler Inhale 1-2 puffs into the lungs every 4 (four) hours as needed for wheezing or shortness of breath.     amiodarone (PACERONE) 200 MG tablet Take 1 tablet (200 mg total) by mouth daily. 90 tablet 3   empagliflozin (JARDIANCE) 10 MG TABS tablet Take 1 tablet (10 mg total) by mouth daily. 30 tablet 5   mexiletine (MEXITIL) 200 MG capsule Take 1 capsule (200 mg total) by mouth every 12 (twelve) hours. 60 capsule 6   midodrine (PROAMATINE) 5 MG tablet Take 1 tablet (5 mg total) by mouth  3 (three) times daily with meals. 90 tablet 4   spironolactone (ALDACTONE) 25 MG tablet Take 1 tablet (25 mg total) by mouth daily. 30 tablet 5   No current facility-administered medications for this visit.   Allergies  Allergen Reactions   Shellfish-Derived Products Hives and Rash   Social History   Socioeconomic History   Marital status: Married    Spouse name: Not on file   Number of children: Not on file   Years of education: Not on file   Highest education level:  Not on file  Occupational History   Occupation: Banker   Tobacco Use   Smoking status: Former    Packs/day: 1.00    Years: 30.00    Total pack years: 30.00    Types: Pipe, Cigarettes   Smokeless tobacco: Never  Vaping Use   Vaping Use: Never used  Substance and Sexual Activity   Alcohol use: No    Alcohol/week: 0.0 standard drinks of alcohol   Drug use: No   Sexual activity: Not on file  Other Topics Concern   Not on file  Social History Narrative   Not on file   Social Determinants of Health   Financial Resource Strain: Not on file  Food Insecurity: Not on file  Transportation Needs: Not on file  Physical Activity: Not on file  Stress: Not on file  Social Connections: Not on file  Intimate Partner Violence: Not on file   Family History  Problem Relation Age of Onset   Asthma Mother    Allergies Mother    Fibromyalgia Mother    Osteoarthritis Mother    Heart Problems Father    Osteoarthritis Maternal Grandmother    There were no vitals taken for this visit.  Wt Readings from Last 3 Encounters:  05/30/21 74.6 kg (164 lb 6.4 oz)  04/15/21 74.3 kg (163 lb 12.8 oz)  03/26/21 76 kg (167 lb 9.6 oz)   PHYSICAL EXAM: General:  Well appearing. No resp difficulty. Wearing Life Vest.  HEENT: normal Neck: supple. no JVD. Carotids 2+ bilat; no bruits. No lymphadenopathy or thryomegaly appreciated. Cor: PMI nondisplaced. Regular rate & rhythm. No rubs, gallops or murmurs. Lungs: clear Abdomen: soft, nontender, nondistended. No hepatosplenomegaly. No bruits or masses. Good bowel sounds. Extremities: no cyanosis, clubbing, rash, edema Neuro: alert & orientedx3, cranial nerves grossly intact. moves all 4 extremities w/o difficulty. Affect pleasant  ASSESSMENT & PLAN: 1. Chronic Biventricular HFrEF/ NICM - Echo (1/23): severely reduced EF < 20% and severely reduced RV. Moderate to severe MR.  - L/RHC (1/23): moderate non-obstructive CAD. EF < 10% - cMRI (1/23): LVEF 10%  with severe biventricular heart failure with basal septal wall mid-wall LGE, apical inferior subendocardial LGE, and RV insertion non specific scar pattern.  - CT chest (1/23): without evidence for pulmonary sarcoidosis.   - Myeloma panel and SPEP/UPEP negative. ACE level OK (37). - Etiology for CM => isolated cardiac sarcoidosis vs prior myocarditis vs genetic cardiomyopathy, +/- PVCs - Cardiac PET is set up next week at [facility] He is not sure he needs it. Genetic testing sent 03/04/21. - Echo today 05/30/21 EF 20-25% RV severely reduced.  - Continue midodrine 5 mg tid.  - No BP room to add ARB. - Continue spironolactone 25 mg daily.  - Continue Jardiance 10 mg daily. - No ? blocker w/ recent low output and low BP.    2. PVCs/NSVT - >20% PVC burden on admit.   - Continue mexiletine 200 mg bid. -  Cut back amio to 200 mg daily.  - No OSA on sleep study 2/23. - Continue LifeVest.    3. COPD - Former heavy smoker. Quit smoking 01/07/21.   4. H/o Pleural Effusion - s/p rt thoracentesis 1/23 w/ 1200 cc fluid removal.  5. DM2: - A1c 6.7 (1/23). - On SGLT2i.  Echo EF 20-25%. Refer to to EP.  Maricela Bo Ardyn Forge NP-C 8:14 AM  07/23/21  Patient seen and examined with the above-signed Advanced Practice Provider and/or Housestaff. I personally reviewed laboratory data, imaging studies and relevant notes. I independently examined the patient and formulated the important aspects of the plan. I have edited the note to reflect any of my changes or salient points. I have personally discussed the plan with the patient and/or family.  Feeling good. NYHA II. Volume status ok. Compliant with meds. On midodrine for BP support.   Echo today reviewed personally EF 20-25%. PVCs suppressed.   General:  Well appearing. No resp difficulty HEENT: normal Neck: supple. no JVD. Carotids 2+ bilat; no bruits. No lymphadenopathy or thryomegaly appreciated. Cor: PMI nondisplaced. Regular rate & rhythm. No rubs,  gallops or murmurs. Wearing LifeVest Lungs: mildly decreased throughout  Abdomen: soft, nontender, nondistended. No hepatosplenomegaly. No bruits or masses. Good bowel sounds. Extremities: no cyanosis, clubbing, rash, edema Neuro: alert & orientedx3, cranial nerves grossly intact. moves all 4 extremities w/o difficulty. Affect pleasant  Overall doing well NYHA II but still requiring midodrine for BP support. PVCs suppressed on mexilitene and amio. Will cut amio to 200 daily. Refer to EP for ICD.   Rafael Bihari, FNP  8:14 AM

## 2021-07-25 ENCOUNTER — Ambulatory Visit: Payer: Medicare Other | Admitting: Cardiovascular Disease

## 2021-07-29 ENCOUNTER — Encounter: Payer: Self-pay | Admitting: Cardiovascular Disease

## 2021-07-29 ENCOUNTER — Telehealth: Payer: Self-pay | Admitting: Cardiovascular Disease

## 2021-07-29 DIAGNOSIS — I420 Dilated cardiomyopathy: Secondary | ICD-10-CM | POA: Diagnosis not present

## 2021-07-29 NOTE — Telephone Encounter (Signed)
Returned call to patient's wife. She reports last week he had to lie in the floor to keep from falling d/t weakness - she had to call EMS to get him up. He reports nothing tastes right, he has poor appetite. Wife won't let him get out of bed. Wife has him using a urinal and has good UOP. He has been in bed since last Thursday, says he is too weak to get out of med. He has not been checking BP at home.   Advised he cannot stay in bed forever and will need to assess cause of persistent weakness. Advised she should monitor his BP at home, as per last HF clinic note low BPs have limited GDMT.   Will route to MDs to review

## 2021-07-29 NOTE — Telephone Encounter (Signed)
Wife called back with BP of 95/71 this AM. Patient is taking all meds as prescribed. Has not eaten anything today, no appetite. Drank water and tea. Current wrist BP 114/57, P 68. Denies dizziness right now. "He is too weak to get out of bed." Recommended ensure or a supplemental drink.

## 2021-07-29 NOTE — Telephone Encounter (Signed)
error 

## 2021-07-29 NOTE — Telephone Encounter (Signed)
Pt's wife states that pt is feeling very weak. She had to call 911 last Thursday. Please advise. They ask that you call the home phone [phone].

## 2021-07-29 NOTE — Telephone Encounter (Signed)
Pt's wife called back with Bp readings: 95/71 and 93/71.

## 2021-07-30 ENCOUNTER — Inpatient Hospital Stay (HOSPITAL_COMMUNITY)
Admission: EM | Admit: 2021-07-30 | Discharge: 2021-08-16 | DRG: 441 | Disposition: A | Discharge status: Skilled Nursing Facility | Payer: Medicare Other | Attending: Internal Medicine | Admitting: Internal Medicine

## 2021-07-30 ENCOUNTER — Encounter (HOSPITAL_COMMUNITY): Payer: Self-pay | Admitting: Family Medicine

## 2021-07-30 ENCOUNTER — Inpatient Hospital Stay (HOSPITAL_COMMUNITY): Payer: Medicare Other

## 2021-07-30 ENCOUNTER — Other Ambulatory Visit: Payer: Self-pay

## 2021-07-30 ENCOUNTER — Emergency Department (HOSPITAL_COMMUNITY): Payer: Medicare Other

## 2021-07-30 DIAGNOSIS — K760 Fatty (change of) liver, not elsewhere classified: Secondary | ICD-10-CM | POA: Diagnosis present

## 2021-07-30 DIAGNOSIS — I2510 Atherosclerotic heart disease of native coronary artery without angina pectoris: Secondary | ICD-10-CM | POA: Diagnosis present

## 2021-07-30 DIAGNOSIS — I5022 Chronic systolic (congestive) heart failure: Secondary | ICD-10-CM | POA: Diagnosis not present

## 2021-07-30 DIAGNOSIS — K831 Obstruction of bile duct: Secondary | ICD-10-CM | POA: Diagnosis not present

## 2021-07-30 DIAGNOSIS — I4819 Other persistent atrial fibrillation: Secondary | ICD-10-CM | POA: Diagnosis present

## 2021-07-30 DIAGNOSIS — K5730 Diverticulosis of large intestine without perforation or abscess without bleeding: Secondary | ICD-10-CM | POA: Diagnosis not present

## 2021-07-30 DIAGNOSIS — I428 Other cardiomyopathies: Secondary | ICD-10-CM | POA: Diagnosis not present

## 2021-07-30 DIAGNOSIS — I517 Cardiomegaly: Secondary | ICD-10-CM | POA: Diagnosis not present

## 2021-07-30 DIAGNOSIS — J449 Chronic obstructive pulmonary disease, unspecified: Secondary | ICD-10-CM | POA: Diagnosis present

## 2021-07-30 DIAGNOSIS — L8990 Pressure ulcer of unspecified site, unspecified stage: Secondary | ICD-10-CM

## 2021-07-30 DIAGNOSIS — K7460 Unspecified cirrhosis of liver: Secondary | ICD-10-CM | POA: Diagnosis present

## 2021-07-30 DIAGNOSIS — I480 Paroxysmal atrial fibrillation: Secondary | ICD-10-CM

## 2021-07-30 DIAGNOSIS — Z66 Do not resuscitate: Secondary | ICD-10-CM | POA: Diagnosis not present

## 2021-07-30 DIAGNOSIS — Z95828 Presence of other vascular implants and grafts: Secondary | ICD-10-CM

## 2021-07-30 DIAGNOSIS — K767 Hepatorenal syndrome: Secondary | ICD-10-CM | POA: Diagnosis present

## 2021-07-30 DIAGNOSIS — E11649 Type 2 diabetes mellitus with hypoglycemia without coma: Secondary | ICD-10-CM | POA: Diagnosis not present

## 2021-07-30 DIAGNOSIS — R188 Other ascites: Secondary | ICD-10-CM | POA: Diagnosis present

## 2021-07-30 DIAGNOSIS — R17 Unspecified jaundice: Principal | ICD-10-CM

## 2021-07-30 DIAGNOSIS — N179 Acute kidney failure, unspecified: Secondary | ICD-10-CM | POA: Diagnosis present

## 2021-07-30 DIAGNOSIS — J90 Pleural effusion, not elsewhere classified: Secondary | ICD-10-CM | POA: Diagnosis not present

## 2021-07-30 DIAGNOSIS — I4729 Other ventricular tachycardia: Secondary | ICD-10-CM

## 2021-07-30 DIAGNOSIS — Z79899 Other long term (current) drug therapy: Secondary | ICD-10-CM

## 2021-07-30 DIAGNOSIS — Z6821 Body mass index (BMI) 21.0-21.9, adult: Secondary | ICD-10-CM

## 2021-07-30 DIAGNOSIS — B179 Acute viral hepatitis, unspecified: Secondary | ICD-10-CM | POA: Diagnosis not present

## 2021-07-30 DIAGNOSIS — E119 Type 2 diabetes mellitus without complications: Secondary | ICD-10-CM | POA: Diagnosis not present

## 2021-07-30 DIAGNOSIS — K8020 Calculus of gallbladder without cholecystitis without obstruction: Secondary | ICD-10-CM | POA: Diagnosis not present

## 2021-07-30 DIAGNOSIS — Z7189 Other specified counseling: Secondary | ICD-10-CM | POA: Diagnosis not present

## 2021-07-30 DIAGNOSIS — L89152 Pressure ulcer of sacral region, stage 2: Secondary | ICD-10-CM | POA: Diagnosis present

## 2021-07-30 DIAGNOSIS — R64 Cachexia: Secondary | ICD-10-CM | POA: Diagnosis present

## 2021-07-30 DIAGNOSIS — I493 Ventricular premature depolarization: Secondary | ICD-10-CM | POA: Diagnosis not present

## 2021-07-30 DIAGNOSIS — D689 Coagulation defect, unspecified: Secondary | ICD-10-CM | POA: Diagnosis not present

## 2021-07-30 DIAGNOSIS — Z452 Encounter for adjustment and management of vascular access device: Secondary | ICD-10-CM | POA: Diagnosis not present

## 2021-07-30 DIAGNOSIS — M797 Fibromyalgia: Secondary | ICD-10-CM | POA: Diagnosis present

## 2021-07-30 DIAGNOSIS — E876 Hypokalemia: Secondary | ICD-10-CM | POA: Diagnosis present

## 2021-07-30 DIAGNOSIS — K7200 Acute and subacute hepatic failure without coma: Secondary | ICD-10-CM | POA: Diagnosis not present

## 2021-07-30 DIAGNOSIS — E43 Unspecified severe protein-calorie malnutrition: Secondary | ICD-10-CM | POA: Diagnosis not present

## 2021-07-30 DIAGNOSIS — E871 Hypo-osmolality and hyponatremia: Secondary | ICD-10-CM | POA: Diagnosis not present

## 2021-07-30 DIAGNOSIS — Z91013 Allergy to seafood: Secondary | ICD-10-CM

## 2021-07-30 DIAGNOSIS — I5041 Acute combined systolic (congestive) and diastolic (congestive) heart failure: Secondary | ICD-10-CM | POA: Diagnosis not present

## 2021-07-30 DIAGNOSIS — R748 Abnormal levels of other serum enzymes: Secondary | ICD-10-CM | POA: Diagnosis not present

## 2021-07-30 DIAGNOSIS — R531 Weakness: Secondary | ICD-10-CM | POA: Diagnosis not present

## 2021-07-30 DIAGNOSIS — I5043 Acute on chronic combined systolic (congestive) and diastolic (congestive) heart failure: Secondary | ICD-10-CM | POA: Diagnosis present

## 2021-07-30 DIAGNOSIS — K7290 Hepatic failure, unspecified without coma: Secondary | ICD-10-CM | POA: Diagnosis not present

## 2021-07-30 DIAGNOSIS — I472 Ventricular tachycardia, unspecified: Secondary | ICD-10-CM | POA: Diagnosis not present

## 2021-07-30 DIAGNOSIS — Z87891 Personal history of nicotine dependence: Secondary | ICD-10-CM

## 2021-07-30 DIAGNOSIS — I50812 Chronic right heart failure: Secondary | ICD-10-CM | POA: Diagnosis not present

## 2021-07-30 DIAGNOSIS — K7589 Other specified inflammatory liver diseases: Secondary | ICD-10-CM | POA: Diagnosis not present

## 2021-07-30 DIAGNOSIS — N281 Cyst of kidney, acquired: Secondary | ICD-10-CM | POA: Diagnosis not present

## 2021-07-30 DIAGNOSIS — I5023 Acute on chronic systolic (congestive) heart failure: Secondary | ICD-10-CM | POA: Diagnosis not present

## 2021-07-30 DIAGNOSIS — Z789 Other specified health status: Secondary | ICD-10-CM | POA: Diagnosis not present

## 2021-07-30 DIAGNOSIS — I509 Heart failure, unspecified: Secondary | ICD-10-CM | POA: Diagnosis not present

## 2021-07-30 DIAGNOSIS — E861 Hypovolemia: Secondary | ICD-10-CM | POA: Diagnosis present

## 2021-07-30 DIAGNOSIS — Z515 Encounter for palliative care: Secondary | ICD-10-CM | POA: Diagnosis not present

## 2021-07-30 DIAGNOSIS — T462X5A Adverse effect of other antidysrhythmic drugs, initial encounter: Secondary | ICD-10-CM | POA: Diagnosis present

## 2021-07-30 DIAGNOSIS — Z7984 Long term (current) use of oral hypoglycemic drugs: Secondary | ICD-10-CM

## 2021-07-30 DIAGNOSIS — I5082 Biventricular heart failure: Secondary | ICD-10-CM | POA: Diagnosis present

## 2021-07-30 DIAGNOSIS — R9431 Abnormal electrocardiogram [ECG] [EKG]: Secondary | ICD-10-CM | POA: Diagnosis not present

## 2021-07-30 DIAGNOSIS — R42 Dizziness and giddiness: Secondary | ICD-10-CM | POA: Diagnosis not present

## 2021-07-30 LAB — LIPASE, BLOOD: Lipase: 56 U/L — ABNORMAL HIGH (ref 11–51)

## 2021-07-30 LAB — OSMOLALITY: Osmolality: 279 mOsm/kg (ref 275–295)

## 2021-07-30 LAB — CBC WITH DIFFERENTIAL/PLATELET
Abs Immature Granulocytes: 0.05 10*3/uL (ref 0.00–0.07)
Basophils Absolute: 0 10*3/uL (ref 0.0–0.1)
Basophils Relative: 0 %
Eosinophils Absolute: 0 10*3/uL (ref 0.0–0.5)
Eosinophils Relative: 0 %
HCT: 41.7 % (ref 39.0–52.0)
Hemoglobin: 15 g/dL (ref 13.0–17.0)
Immature Granulocytes: 1 %
Lymphocytes Relative: 7 %
Lymphs Abs: 0.4 10*3/uL — ABNORMAL LOW (ref 0.7–4.0)
MCH: 32.3 pg (ref 26.0–34.0)
MCHC: 36 g/dL (ref 30.0–36.0)
MCV: 89.9 fL (ref 80.0–100.0)
Monocytes Absolute: 0.8 10*3/uL (ref 0.1–1.0)
Monocytes Relative: 12 %
Neutro Abs: 5.2 10*3/uL (ref 1.7–7.7)
Neutrophils Relative %: 80 %
Platelets: DECREASED 10*3/uL (ref 150–400)
RBC: 4.64 MIL/uL (ref 4.22–5.81)
RDW: 18.2 % — ABNORMAL HIGH (ref 11.5–15.5)
WBC: 6.5 10*3/uL (ref 4.0–10.5)
nRBC: 0 % (ref 0.0–0.2)

## 2021-07-30 LAB — COMPREHENSIVE METABOLIC PANEL
ALT: 514 U/L — ABNORMAL HIGH (ref 0–44)
AST: 203 U/L — ABNORMAL HIGH (ref 15–41)
Albumin: 3.5 g/dL (ref 3.5–5.0)
Alkaline Phosphatase: 116 U/L (ref 38–126)
Anion gap: 15 (ref 5–15)
BUN: 67 mg/dL — ABNORMAL HIGH (ref 8–23)
CO2: 23 mmol/L (ref 22–32)
Calcium: 8.9 mg/dL (ref 8.9–10.3)
Chloride: 84 mmol/L — ABNORMAL LOW (ref 98–111)
Creatinine, Ser: 1.6 mg/dL — ABNORMAL HIGH (ref 0.61–1.24)
GFR, Estimated: 47 mL/min — ABNORMAL LOW (ref >60)
Glucose, Bld: 138 mg/dL — ABNORMAL HIGH (ref 70–99)
Potassium: 4.6 mmol/L (ref 3.5–5.1)
Sodium: 122 mmol/L — ABNORMAL LOW (ref 135–145)
Total Bilirubin: 25.9 mg/dL (ref 0.3–1.2)
Total Protein: 7.4 g/dL (ref 6.5–8.1)

## 2021-07-30 LAB — URINALYSIS, ROUTINE W REFLEX MICROSCOPIC
Glucose, UA: >=500 mg/dL — AB
Hgb urine dipstick: NEGATIVE
Ketones, ur: NEGATIVE mg/dL
Leukocytes,Ua: NEGATIVE
Nitrite: NEGATIVE
Protein, ur: 30 mg/dL — AB
Specific Gravity, Urine: 1.02 (ref 1.005–1.030)
pH: 5.5 (ref 5.0–8.0)

## 2021-07-30 LAB — ACETAMINOPHEN LEVEL: Acetaminophen (Tylenol), Serum: <10 ug/mL — ABNORMAL LOW (ref 10–30)

## 2021-07-30 LAB — URINALYSIS, MICROSCOPIC (REFLEX)

## 2021-07-30 LAB — LACTATE DEHYDROGENASE: LDH: 319 U/L — ABNORMAL HIGH (ref 98–192)

## 2021-07-30 LAB — PROTIME-INR
INR: 1.6 — ABNORMAL HIGH (ref 0.8–1.2)
Prothrombin Time: 18.7 seconds — ABNORMAL HIGH (ref 11.4–15.2)

## 2021-07-30 LAB — SODIUM, URINE, RANDOM: Sodium, Ur: <10 mmol/L

## 2021-07-30 LAB — AMMONIA: Ammonia: 22 umol/L (ref 9–35)

## 2021-07-30 LAB — CBG MONITORING, ED: Glucose-Capillary: 147 mg/dL — ABNORMAL HIGH (ref 70–99)

## 2021-07-30 LAB — HEPATITIS PANEL, ACUTE
HCV Ab: NONREACTIVE
Hep A IgM: NONREACTIVE
Hep B C IgM: NONREACTIVE
Hepatitis B Surface Ag: NONREACTIVE

## 2021-07-30 LAB — CREATININE, URINE, RANDOM: Creatinine, Urine: 94.69 mg/dL

## 2021-07-30 LAB — OSMOLALITY, URINE: Osmolality, Ur: 617 mOsm/kg (ref 300–900)

## 2021-07-30 LAB — ETHANOL: Alcohol, Ethyl (B): <10 mg/dL (ref <10)

## 2021-07-30 IMAGING — US US ABDOMEN LIMITED
1 series · 14 of 25 positions shown · non-contrast
Comparison: Abdominal ultrasound [DATE]

CLINICAL DATA: Jaundice

EXAM:
ULTRASOUND ABDOMEN LIMITED RIGHT UPPER QUADRANT

[Series 1: us abdomen limited ruq (liver/gb) · 14 of 57 slices shown]
[im 1/57]
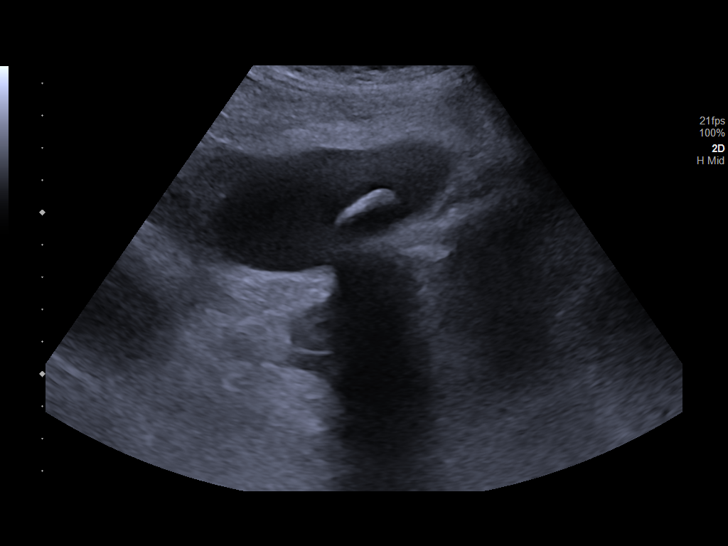
[im 5/57]
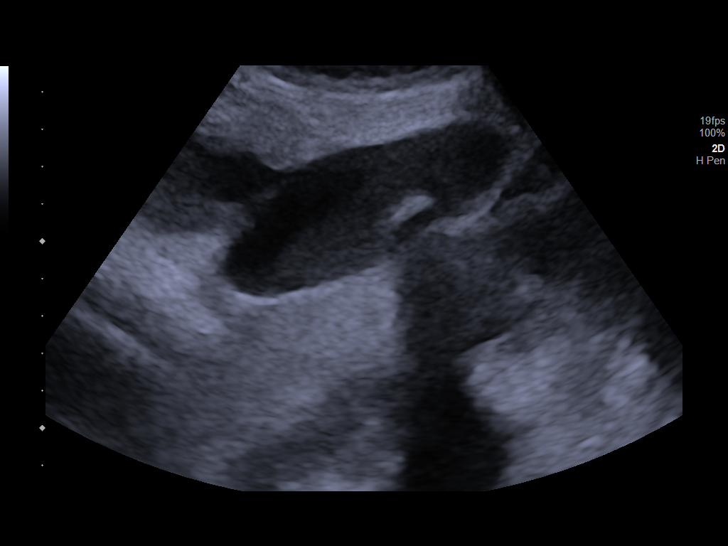
[im 10/57]
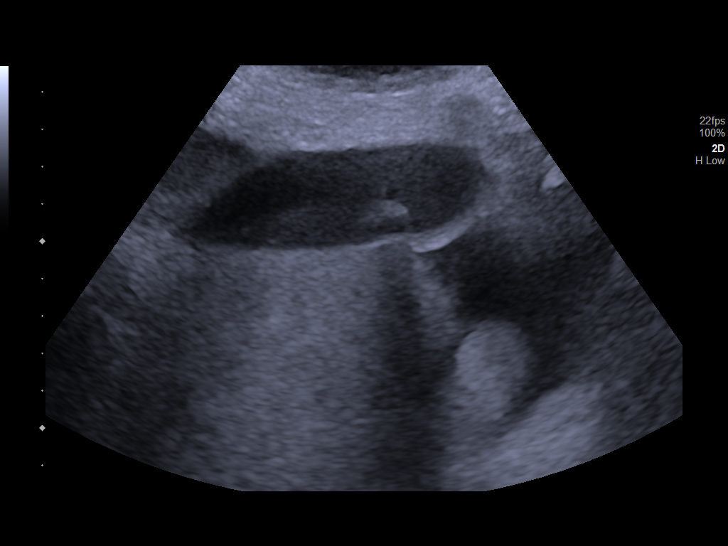
[im 15/57]
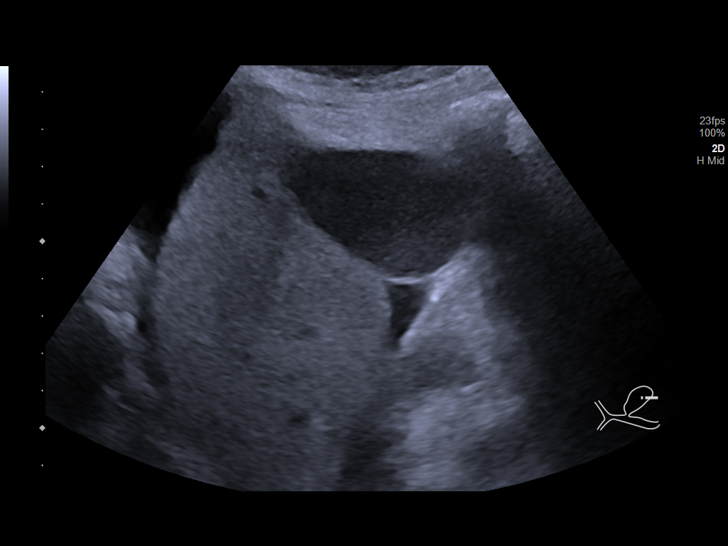
[im 19/57]
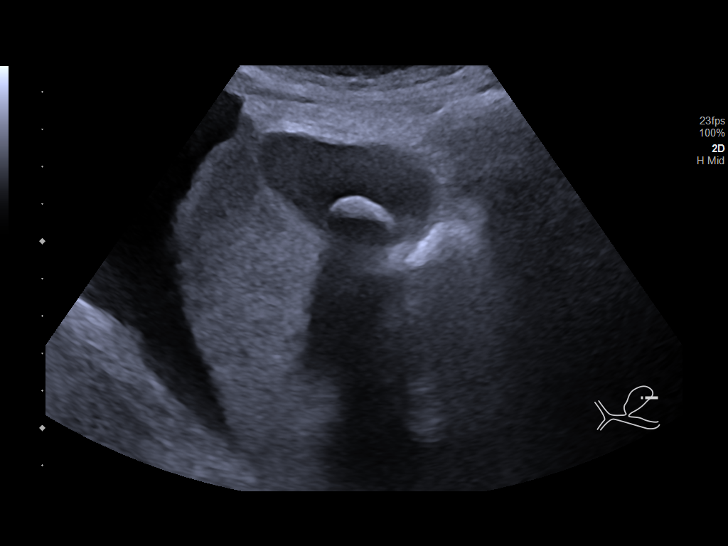
[im 22/57]
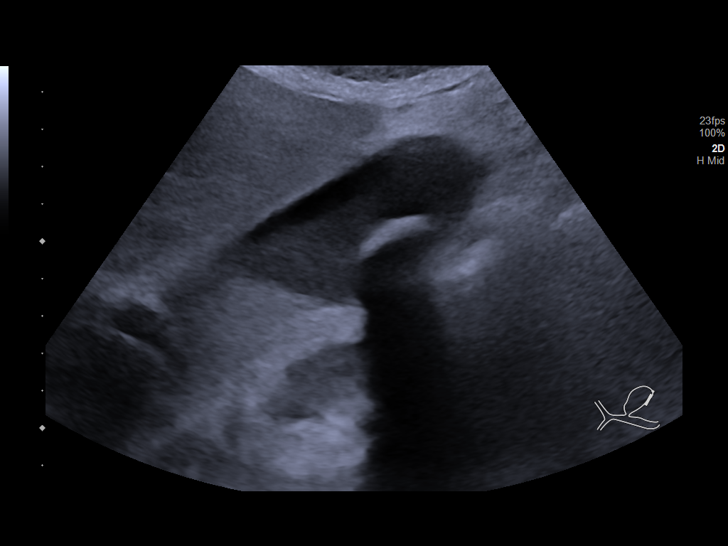
[im 26/57]
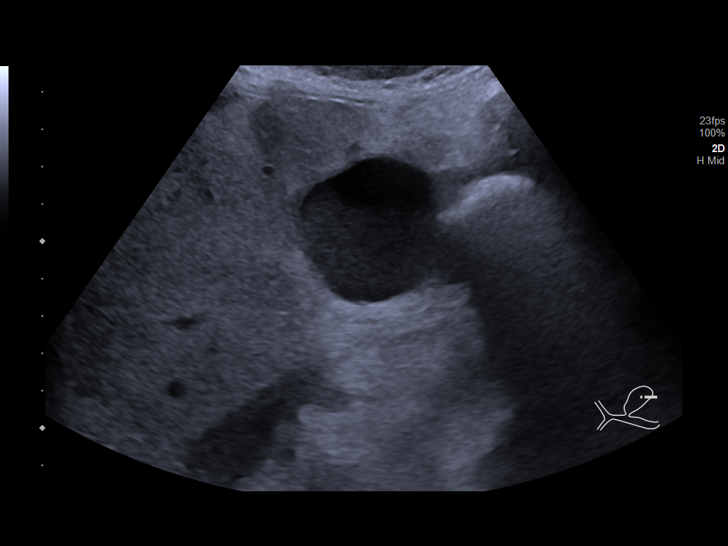
[im 31/57]
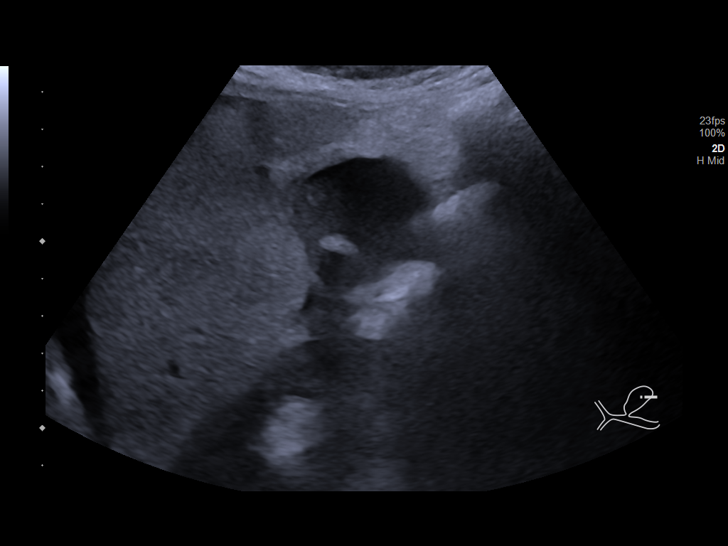
[im 36/57]
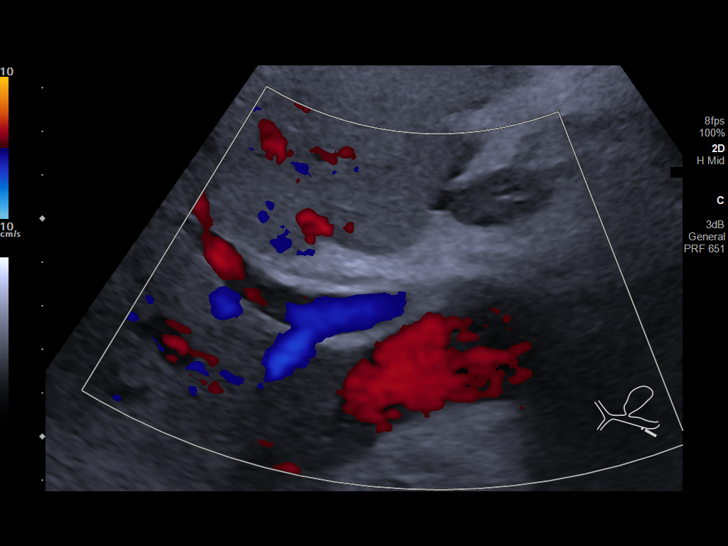
[im 38/57]
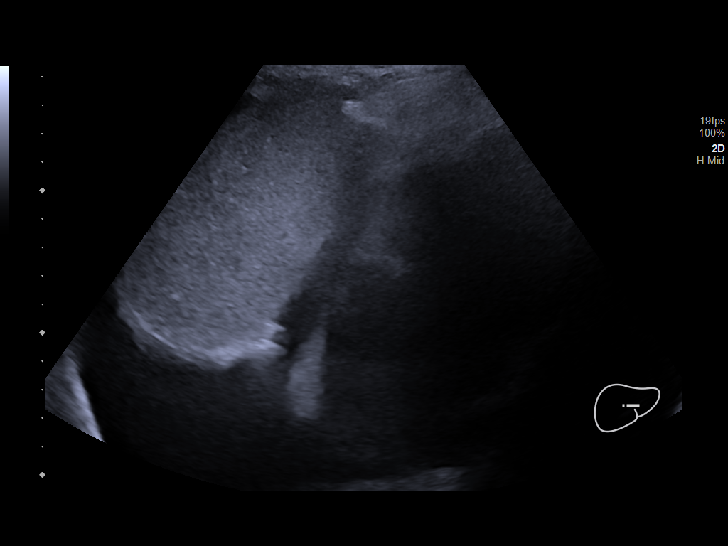
[im 43/57]
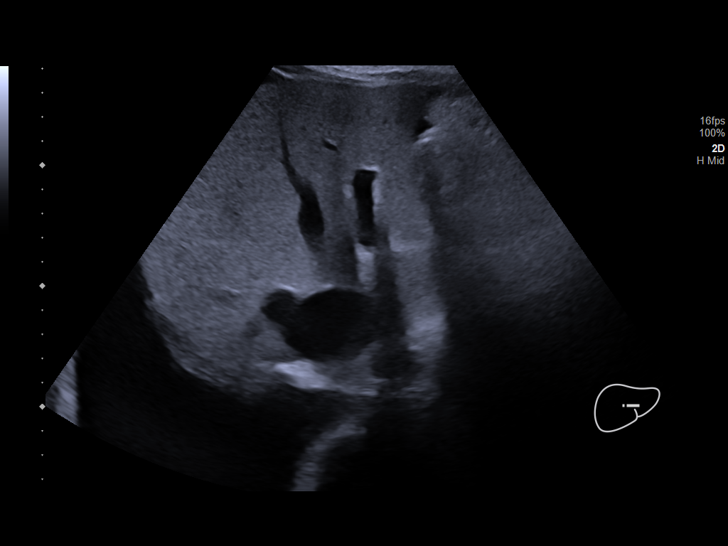
[im 47/57]
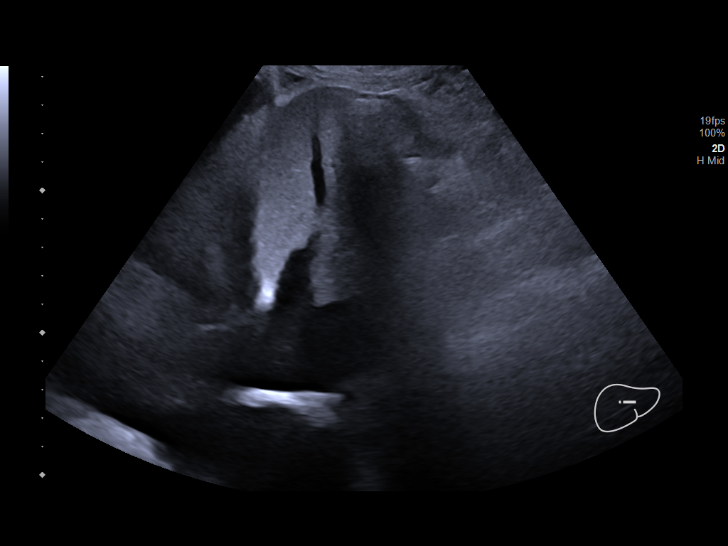
[im 52/57]
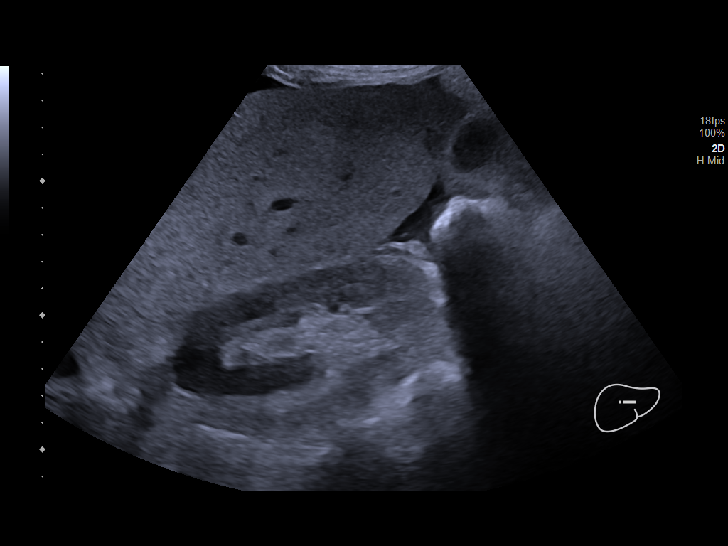
[im 57/57]
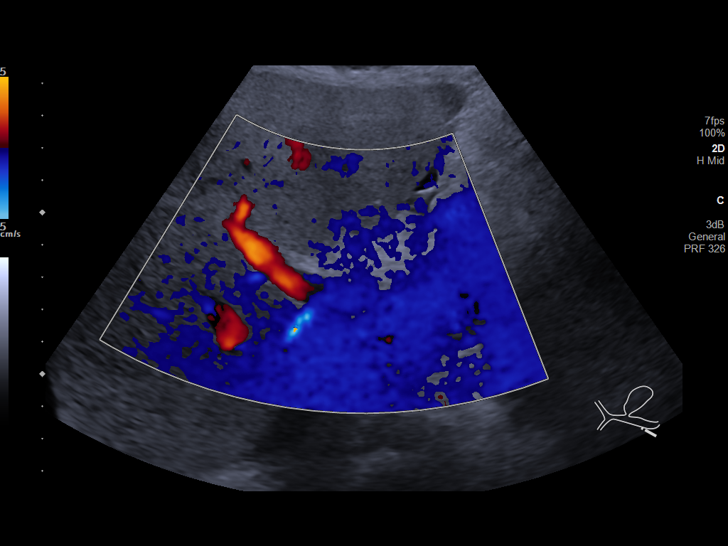

[14 of 25 positions shown; findings below may reference images not displayed]

FINDINGS: Gallbladder:

2.2 cm echogenic shadowing calculus. Low-level echogenic sludge. No
wall thickening or sonographic Murphy's sign.

Common bile duct:

Diameter: 4 mm

Liver:

Increased echogenicity of the parenchyma with no focal mass
identified. Portal vein is patent on color Doppler imaging with
normal direction of blood flow towards the liver.

Other: Right pleural effusion.  Ascites.
IMPRESSION: 1. Right pleural effusion.
2. Ascites.
3. Increased echogenicity of the liver parenchyma which may
represent hepatic steatosis and/or other hepatocellular disease.
4. Large gallstone.  Gallbladder sludge.

## 2021-07-30 MED ORDER — SODIUM CHLORIDE 0.9 % IV SOLN: INTRAVENOUS | Status: DC

## 2021-07-30 MED ORDER — ONDANSETRON HCL 4 MG/2ML IJ SOLN: 4.0000 mg | Freq: Four times a day (QID) | INTRAMUSCULAR | Status: DC | PRN

## 2021-07-30 MED ORDER — INSULIN ASPART 100 UNIT/ML IJ SOLN: 0.0000 [IU] | Freq: Three times a day (TID) | INTRAMUSCULAR | Status: DC

## 2021-07-30 MED ORDER — SODIUM CHLORIDE 0.9% FLUSH
3.0000 mL | Freq: Two times a day (BID) | INTRAVENOUS | Status: DC
  Administered 2021-07-30 – 2021-08-16 (×24): 3 mL via INTRAVENOUS

## 2021-07-30 MED ORDER — INSULIN ASPART 100 UNIT/ML IJ SOLN: 0.0000 [IU] | INTRAMUSCULAR | Status: DC

## 2021-07-30 MED ORDER — ALBUTEROL SULFATE (2.5 MG/3ML) 0.083% IN NEBU
2.5000 mg | INHALATION_SOLUTION | RESPIRATORY_TRACT | Status: DC | PRN
Start: 2021-07-30 — End: 2021-08-16

## 2021-07-30 NOTE — ED Triage Notes (Signed)
Pt BIB EMS from home for generalized weakness since Friday morning that has been worsening. Pt endorses constant dizziness. Pt has yellowing of the skin, denies hx of jaundice. Has hx of CHF, O2 is 97% on room air

## 2021-07-30 NOTE — ED Notes (Signed)
Sent message to previous nurse for report

## 2021-07-30 NOTE — ED Notes (Signed)
Received verbal report from Eliberto Ivory RN at this time

## 2021-07-30 NOTE — Telephone Encounter (Signed)
Patient currently at ED

## 2021-07-30 NOTE — Telephone Encounter (Signed)
Wife reports worsening symptoms of weakness, no appetite. Says he doesn't feel well at all. Advised to contact EMS for transport to ED for evaluation. Advised that our cardiologist don't do direct admits. Advised that EMS would be able to get patient out of house. Verbalized understanding.

## 2021-07-30 NOTE — Telephone Encounter (Signed)
Patient's wife is calling back regarding this stating she is needs help. She is wanting Dr. Tresa Endo to request him to be hospitalized by ambulance until he gets stronger. Please advise.

## 2021-07-30 NOTE — ED Notes (Signed)
Critical total bil 25.9 reported to Dr. Rubin Payor.

## 2021-07-30 NOTE — ED Provider Notes (Signed)
[facility] EMERGENCY DEPARTMENT Provider Note   CSN: [id number] Arrival date & time: 07/30/21  1646     History  Chief Complaint  Patient presents with   Weakness    Erik Chan is a 66 y.o. male.  The history is provided by the patient.  Weakness Has felt kind of weak over the last week or 2.  However Friday he had more generalized weakness.  Now yellowing of the skin.  Does have a history of CHF and sees Dr. [name] for it.  Denies drinking alcohol.  Denies Tylenol use.  States he is compliant with his medications.  No injury.  States he feels more weak all over.  No real abdominal pain.  Has had some mild diarrhea times     Home Medications Prior to Admission medications   Medication Sig Start Date End Date Taking? Authorizing Provider  acetaminophen (TYLENOL) 500 MG tablet Take 500 mg by mouth every 6 (six) hours as needed for mild pain, fever or headache.   Yes [provider]  albuterol (VENTOLIN HFA) 108 (90 Base) MCG/ACT inhaler Inhale 1-2 puffs into the lungs every 4 (four) hours as needed for wheezing or shortness of breath. 01/28/21  Yes [provider]  amiodarone (PACERONE) 200 MG tablet Take 1 tablet (200 mg total) by mouth daily. 05/30/21  Yes Bensimhon, Bevelyn Buckles, MD  empagliflozin (JARDIANCE) 10 MG TABS tablet Take 1 tablet (10 mg total) by mouth daily. 03/26/21  Yes Laurey Morale, MD  mexiletine (MEXITIL) 200 MG capsule Take 1 capsule (200 mg total) by mouth every 12 (twelve) hours. 03/26/21  Yes Laurey Morale, MD  midodrine (PROAMATINE) 5 MG tablet Take 1 tablet (5 mg total) by mouth 3 (three) times daily with meals. 04/15/21  Yes Milford, Anderson Malta, FNP  spironolactone (ALDACTONE) 25 MG tablet Take 1 tablet (25 mg total) by mouth daily. 03/26/21  Yes Laurey Morale, MD      Allergies    Shellfish-derived products    Review of Systems   Review of Systems  Neurological:  Positive for weakness.    Physical  Exam Updated Vital Signs BP 105/66   Pulse 65   Temp 97.6 F (36.4 C) (Oral)   Resp 18   SpO2 97%  Physical Exam Vitals reviewed.  Eyes:     General: Scleral icterus present.  Cardiovascular:     Rate and Rhythm: Regular rhythm.  Pulmonary:     Breath sounds: No stridor. No wheezing.  Abdominal:     Tenderness: There is no abdominal tenderness.  Musculoskeletal:        General: No tenderness.  Skin:    Coloration: Skin is jaundiced.  Neurological:     Mental Status: He is alert and oriented to person, place, and time.   Patient has jaundice, particular to his face.  ED Results / Procedures / Treatments   Labs (all labs ordered are listed, but only abnormal results are displayed) Labs Reviewed  PROTIME-INR - Abnormal; Notable for the following components:      Result Value   Prothrombin Time 18.7 (*)    INR 1.6 (*)    All other components within normal limits  ACETAMINOPHEN LEVEL - Abnormal; Notable for the following components:   Acetaminophen (Tylenol), Serum <10 (*)    All other components within normal limits  COMPREHENSIVE METABOLIC PANEL - Abnormal; Notable for the following components:   Sodium 122 (*)    Chloride 84 (*)  Glucose, Bld 138 (*)    BUN 67 (*)    Creatinine, Ser 1.60 (*)    AST 203 (*)    ALT 514 (*)    Total Bilirubin 25.9 (*)    GFR, Estimated 47 (*)    All other components within normal limits  CBC WITH DIFFERENTIAL/PLATELET - Abnormal; Notable for the following components:   RDW 18.2 (*)    Lymphs Abs 0.4 (*)    All other components within normal limits  URINALYSIS, ROUTINE W REFLEX MICROSCOPIC - Abnormal; Notable for the following components:   Color, Urine AMBER (*)    APPearance HAZY (*)    Glucose, UA >=500 (*)    Bilirubin Urine LARGE (*)    Protein, ur 30 (*)    All other components within normal limits  LIPASE, BLOOD - Abnormal; Notable for the following components:   Lipase 56 (*)    All other components within normal  limits  LACTATE DEHYDROGENASE - Abnormal; Notable for the following components:   LDH 319 (*)    All other components within normal limits  URINALYSIS, MICROSCOPIC (REFLEX) - Abnormal; Notable for the following components:   Bacteria, UA FEW (*)    All other components within normal limits  CBG MONITORING, ED - Abnormal; Notable for the following components:   Glucose-Capillary 147 (*)    All other components within normal limits  AMMONIA  ETHANOL  HEPATITIS PANEL, ACUTE  OSMOLALITY  SODIUM, URINE, RANDOM  OSMOLALITY, URINE  CREATININE, URINE, RANDOM  COMPREHENSIVE METABOLIC PANEL  CBC  UREA NITROGEN, URINE    EKG None  Radiology CT ABDOMEN PELVIS WO CONTRAST  Result Date: 07/30/2021 CLINICAL DATA:  Ascites and jaundice. EXAM: CT ABDOMEN AND PELVIS WITHOUT CONTRAST TECHNIQUE: Multidetector CT imaging of the abdomen and pelvis was performed following the standard protocol without IV contrast. RADIATION DOSE REDUCTION: This exam was performed according to the departmental dose-optimization program which includes automated exposure control, adjustment of the mA and/or kV according to patient size and/or use of iterative reconstruction technique. COMPARISON:  Abdominal ultrasound dated 07/30/2021. FINDINGS: Evaluation of this exam is limited in the absence of intravenous contrast. Lower chest: Partially visualized moderate right and small left pleural effusions with partial compressive atelectasis of the lung bases versus pneumonia. There is mild cardiomegaly with mild biatrial dilatation. No intra-abdominal free air. Diffuse mesenteric edema and small ascites. Hepatobiliary: The liver is unremarkable. No biliary ductal dilatation. There is a gallstone. High attenuating content within the gallbladder most consistent with vicarious excretion of recently administered contrast. Pancreas: The pancreas is unremarkable as visualized. Spleen: Normal in size without focal abnormality.  Adrenals/Urinary Tract: The adrenal glands are unremarkable. There is no hydronephrosis or nephrolithiasis on either side. There is a 4.5 cm left renal upper pole cyst. The visualized ureters and urinary bladder appear unremarkable. Stomach/Bowel: There is sigmoid diverticulosis. There is no bowel obstruction. The appendix is normal. Vascular/Lymphatic: Mild aortoiliac atherosclerotic disease. The IVC is unremarkable. No portal venous gas. There is no adenopathy. Reproductive: The prostate and seminal vesicles are grossly unremarkable. No pelvic mass. Other: Diffuse mesenteric and subcutaneous edema and anasarca. Musculoskeletal: No acute or significant osseous findings. IMPRESSION: 1. Bilateral pleural effusions, small ascites, and anasarca. 2. Cholelithiasis. 3. Sigmoid diverticulosis. No bowel obstruction. Normal appendix. 4. Aortic Atherosclerosis (ICD10-I70.0). Electronically Signed   By: Elgie Collard M.D.   On: 07/30/2021 22:16   US Abdomen Limited RUQ (LIVER/GB)  Result Date: 07/30/2021 CLINICAL DATA:  Jaundice EXAM: ULTRASOUND ABDOMEN LIMITED  RIGHT UPPER QUADRANT COMPARISON:  Abdominal ultrasound 01/30/2021 FINDINGS: Gallbladder: 2.2 cm echogenic shadowing calculus. Low-level echogenic sludge. No wall thickening or sonographic Murphy's sign. Common bile duct: Diameter: 4 mm Liver: Increased echogenicity of the parenchyma with no focal mass identified. Portal vein is patent on color Doppler imaging with normal direction of blood flow towards the liver. Other: Right pleural effusion.  Ascites. IMPRESSION: 1. Right pleural effusion. 2. Ascites. 3. Increased echogenicity of the liver parenchyma which may represent hepatic steatosis and/or other hepatocellular disease. 4. Large gallstone.  Gallbladder sludge. Electronically Signed   By: Jannifer Hick M.D.   On: 07/30/2021 19:11    Procedures Procedures    Medications Ordered in ED Medications  sodium chloride flush (NS) 0.9 % injection 3 mL (3  mLs Intravenous Given 07/30/21 2235)  insulin aspart (novoLOG) injection 0-6 Units (has no administration in time range)  albuterol (PROVENTIL) (2.5 MG/3ML) 0.083% nebulizer solution 2.5 mg (has no administration in time range)  0.9 %  sodium chloride infusion ( Intravenous New Bag/Given 07/30/21 2235)  ondansetron (ZOFRAN) injection 4 mg (has no administration in time range)    ED Course/ Medical Decision Making/ A&P                           Medical Decision Making Amount and/or Complexity of Data Reviewed Labs: ordered. Radiology: ordered.  Risk Decision regarding hospitalization.  Patient presents with jaundice.  Has had for the last few days but is felt weak for the last couple days.  Has a history of CHF.  Bilirubin is elevated at 26.  INR at 1.6.  Creatinine also mildly elevated at 1.6.  Sodium 122.  Ammonia normal and normal alcohol level.  She denies Tylenol use denies alcohol use.  Ultrasound done and showed gallstones with normal common bile duct.  Did have some mild potential steatosis or liver disease.  Discussed with Dr. Christella Hartigan from [facility] since patient has an [facility] primary.  States Dr. Dulce Sellar will round tomorrow.  Recommended getting a lipase and CT scan.  Also we will add hepatitis panel.  They will round on him tomorrow but does not need transfer to an outside facility           Final Clinical Impression(s) / ED Diagnoses Final diagnoses:  Jaundice    Rx / DC Orders ED Discharge Orders     None         Benjiman Core, MD 07/30/21 2340

## 2021-07-30 NOTE — H&P (Addendum)
History and Physical    Erik Chan VWU:[id number] DOB: Apr 11, 1955 DOA: 07/30/2021  PCP: Kathyrn Lass, MD   Patient coming from: Home  Chief Complaint: Jaundice, loss of appetite, fatigue   HPI: Erik Chan is a pleasant 66 y.o. male with medical history significant for COPD and nonischemic cardiomyopathy with EF 20 to 25% and severely reduced RV systolic function, now presenting to the emergency department for evaluation of jaundice, fatigue, and loss of appetite.  Patient reportedly became so generally weak and fatigued that he laid down on the floor and was unable to get up the night of 07/25/21.  His wife had to call EMS to help get him back into bed at that time and he has essentially remained in bed since then, sitting at the side of the bed to use the urinal, and not eating.  He has been constantly asking for water, and his wife was able to get him to drink an Ensure today, but with his profound fatigue and general weakness, she called EMS again.  They noted that he was jaundiced and the patient's wife states that she also noticed that over the past couple days.  Patient has not had any pain and also denies nausea, vomiting, diarrhea, fever, or chills.  He has never experienced this previously.  He denies any alcohol or acetaminophen use. He has not seemed confused per his wife.   ED Course: Upon arrival to the ED, patient is found to be afebrile and saturating well on room air with stable blood pressure in the 90s and low 295A systolic.  Chemistry panel notable for sodium 122, BUN 67, creatinine 1.60, AST 203, ALT 514, and total bilirubin 25.9.  INR is 1.6.  Platelets are clumped and count appears decreased.  Acetaminophen and ethanol are undetectable.  Ammonia level is normal.  Ultrasound demonstrates right pleural effusion, ascites, and increased echogenicity of the liver parenchyma.  Gastroenterology was consulted by the ED physician, viral hepatitis panel and CT of the abdomen and  pelvis were ordered, and hospitalist asked to admit.  Review of Systems:  All other systems reviewed and apart from HPI, are negative.  Past Medical History:  Diagnosis Date   CHF (congestive heart failure) ([place]) 02/13/2021   LVEF less than 20%   COPD (chronic obstructive pulmonary disease) ([facility])    Fibromyalgia     Past Surgical History:  Procedure Laterality Date   RIGHT/LEFT HEART CATH AND CORONARY ANGIOGRAPHY N/A 02/19/2021   Procedure: RIGHT/LEFT HEART CATH AND CORONARY ANGIOGRAPHY;  Surgeon: Jolaine Artist, MD;  Location: [facility];  Service: Cardiovascular;  Laterality: N/A;    Social History:   reports that he has quit smoking. His smoking use included pipe and cigarettes. He has a 30.00 pack-year smoking history. He has never used smokeless tobacco. He reports that he does not drink alcohol and does not use drugs.  Allergies  Allergen Reactions   Shellfish-Derived Products Hives and Rash    Family History  Problem Relation Age of Onset   Asthma Mother    Allergies Mother    Fibromyalgia Mother    Osteoarthritis Mother    Heart Problems Father    Osteoarthritis Maternal Grandmother      Prior to Admission medications   Medication Sig Start Date End Date Taking? Authorizing Provider  acetaminophen (TYLENOL) 500 MG tablet Take 500 mg by mouth every 6 (six) hours as needed for mild pain, fever or headache.    [provider]  albuterol (VENTOLIN  HFA) 108 (90 Base) MCG/ACT inhaler Inhale 1-2 puffs into the lungs every 4 (four) hours as needed for wheezing or shortness of breath. 01/28/21   [provider]  amiodarone (PACERONE) 200 MG tablet Take 1 tablet (200 mg total) by mouth daily. 05/30/21   Bensimhon, Shaune Pascal, MD  empagliflozin (JARDIANCE) 10 MG TABS tablet Take 1 tablet (10 mg total) by mouth daily. 03/26/21   Larey Dresser, MD  mexiletine (MEXITIL) 200 MG capsule Take 1 capsule (200 mg total) by mouth every 12 (twelve) hours.  03/26/21   Larey Dresser, MD  midodrine (PROAMATINE) 5 MG tablet Take 1 tablet (5 mg total) by mouth 3 (three) times daily with meals. 04/15/21   Rafael Bihari, FNP  spironolactone (ALDACTONE) 25 MG tablet Take 1 tablet (25 mg total) by mouth daily. 03/26/21   Larey Dresser, MD    Physical Exam: Vitals:   07/30/21 1815 07/30/21 1830 07/30/21 1900 07/30/21 1915  BP:   96/73 99/67  Pulse: 68 69 69 69  Resp: $Remo'13 19 19 20  'ajNOc$ Temp:      TempSrc:      SpO2: 97% 97% 97% 96%    Constitutional: NAD, cachectic  Eyes: PERTLA, scleral icterus ENMT: Mucous membranes are moist. Posterior pharynx clear of any exudate or lesions.   Neck: supple, no masses  Respiratory:  no wheezing, no crackles. No accessory muscle use.  Cardiovascular: S1 & S2 heard, regular rate and rhythm. Mild ankle swelling bilaterally. Abdomen: no tenderness, soft. Bowel sounds active.  Musculoskeletal: no clubbing / cyanosis. No joint deformity upper and lower extremities.   Skin: Jaundiced. Warm, dry, well-perfused. Neurologic: CN 2-12 grossly intact. Sensation intact. Moving all extremities. Alert and oriented to person, place, and situation.  Psychiatric: Calm. Cooperative.    Labs and Imaging on Admission: I have personally reviewed following labs and imaging studies  CBC: Recent Labs  Lab 07/30/21 1722  WBC 6.5  NEUTROABS 5.2  HGB 15.0  HCT 41.7  MCV 89.9  PLT PLATELET CLUMPS NOTED ON SMEAR, COUNT APPEARS DECREASED   Basic Metabolic Panel: Recent Labs  Lab 07/30/21 1722  NA 122*  K 4.6  CL 84*  CO2 23  GLUCOSE 138*  BUN 67*  CREATININE 1.60*  CALCIUM 8.9   GFR: CrCl cannot be calculated (Unknown ideal weight.). Liver Function Tests: Recent Labs  Lab 07/30/21 1722  AST 203*  ALT 514*  ALKPHOS 116  BILITOT 25.9*  PROT 7.4  ALBUMIN 3.5   No results for input(s): "LIPASE", "AMYLASE" in the last 168 hours. Recent Labs  Lab 07/30/21 1722  AMMONIA 22   Coagulation Profile: Recent  Labs  Lab 07/30/21 1722  INR 1.6*   Cardiac Enzymes: No results for input(s): "CKTOTAL", "CKMB", "CKMBINDEX", "TROPONINI" in the last 168 hours. BNP (last 3 results) No results for input(s): "PROBNP" in the last 8760 hours. HbA1C: No results for input(s): "HGBA1C" in the last 72 hours. CBG: No results for input(s): "GLUCAP" in the last 168 hours. Lipid Profile: No results for input(s): "CHOL", "HDL", "LDLCALC", "TRIG", "CHOLHDL", "LDLDIRECT" in the last 72 hours. Thyroid Function Tests: No results for input(s): "TSH", "T4TOTAL", "FREET4", "T3FREE", "THYROIDAB" in the last 72 hours. Anemia Panel: No results for input(s): "VITAMINB12", "FOLATE", "FERRITIN", "TIBC", "IRON", "RETICCTPCT" in the last 72 hours. Urine analysis: No results found for: "COLORURINE", "APPEARANCEUR", "LABSPEC", "PHURINE", "GLUCOSEU", "HGBUR", "BILIRUBINUR", "KETONESUR", "PROTEINUR", "UROBILINOGEN", "NITRITE", "LEUKOCYTESUR" Sepsis Labs: $RemoveBefo'@LABRCNTIP'tknrpvtrlCv$ (procalcitonin:4,lacticidven:4) )No results found for this or any previous visit (from the  past 240 hour(s)).   Radiological Exams on Admission: US Abdomen Limited RUQ (LIVER/GB)  Result Date: 07/30/2021 CLINICAL DATA:  Jaundice EXAM: ULTRASOUND ABDOMEN LIMITED RIGHT UPPER QUADRANT COMPARISON:  Abdominal ultrasound 01/30/2021 FINDINGS: Gallbladder: 2.2 cm echogenic shadowing calculus. Low-level echogenic sludge. No wall thickening or sonographic Murphy's sign. Common bile duct: Diameter: 4 mm Liver: Increased echogenicity of the parenchyma with no focal mass identified. Portal vein is patent on color Doppler imaging with normal direction of blood flow towards the liver. Other: Right pleural effusion.  Ascites. IMPRESSION: 1. Right pleural effusion. 2. Ascites. 3. Increased echogenicity of the liver parenchyma which may represent hepatic steatosis and/or other hepatocellular disease. 4. Large gallstone.  Gallbladder sludge. Electronically Signed   By: Ofilia Neas M.D.    On: 07/30/2021 19:11    EKG (independently interpreted): SR, 1st degr AVB, QTc 440.   Assessment/Plan   1. Severe acute hepatitis  - Presents with painless jaundice, fatigue, and loss of appetite and found to have normal alk phos, AST 203, ALT 514, t bili 25.9, INR 1.6, decreased platelets, no encephalopathy  - Denies EtOH or APAP use, no recent infectious sxs  - CT and viral hepatitis panel pending  - Appreciate GI consultation, will follow-up on recommendations, continue supportive care, trend labs for now    2. Hyponatremia  - Serum sodium 122 on admission  - Has mild lower leg edema b/l and has been drinking a lot of water and 1 Ensure since 6/16, not eating per wife  - Start gentle IVF hydration with NS, mindful of low EF, limit free-water, follow serial chem panels   3. AKI  - SCr is 1.60 on admission, up from baseline of ~1  - Likely prerenal in setting of anorexia  - Check UA and urine chemistries, renally-dose medications, start cautious IVF hydration, repeat chem panel in am   4. Chronic systolic CHF; chronic RV failure  - NICM with LV EF 20-25% and severely reduced RV systolic function on TTE in April 2023  - Appears hypovolemic on admission, monitor closely while gently hydrating    5. COPD  - Not in exacerbation on admission   - Continue inhalers   6. Recurrent right pleural effusion  - Noted on [place] in ED, had thoracentesis in January that appeared transudative  - No respiratory s/s on admission  - Monitor, consider thoracentesis    7. Type II DM  - A1c was 6.7% in January 2023  - Check CBGs and use low-intensity SSI if needed    DVT prophylaxis: SCDs  Code Status: Full  Level of Care:  Level of care: Telemetry Medical Family Communication: wife updated by phone  Disposition Plan:  Patient is from: Home  Anticipated d/c is to: TBD Anticipated d/c date is: 08/03/21 Patient currently: Pending GI consultation   Consults called: GI Admission status: Inpatient      Vianne Bulls, MD [facility]  07/30/2021, 8:39 PM

## 2021-07-31 ENCOUNTER — Ambulatory Visit: Payer: Medicare Other | Admitting: Cardiovascular Disease

## 2021-07-31 ENCOUNTER — Inpatient Hospital Stay (HOSPITAL_COMMUNITY): Payer: Medicare Other

## 2021-07-31 DIAGNOSIS — B179 Acute viral hepatitis, unspecified: Secondary | ICD-10-CM | POA: Diagnosis not present

## 2021-07-31 LAB — COMPREHENSIVE METABOLIC PANEL
ALT: 446 U/L — ABNORMAL HIGH (ref 0–44)
AST: 184 U/L — ABNORMAL HIGH (ref 15–41)
Albumin: 3.2 g/dL — ABNORMAL LOW (ref 3.5–5.0)
Alkaline Phosphatase: 103 U/L (ref 38–126)
Anion gap: 12 (ref 5–15)
BUN: 67 mg/dL — ABNORMAL HIGH (ref 8–23)
CO2: 21 mmol/L — ABNORMAL LOW (ref 22–32)
Calcium: 8.5 mg/dL — ABNORMAL LOW (ref 8.9–10.3)
Chloride: 86 mmol/L — ABNORMAL LOW (ref 98–111)
Creatinine, Ser: 1.46 mg/dL — ABNORMAL HIGH (ref 0.61–1.24)
GFR, Estimated: 53 mL/min — ABNORMAL LOW (ref >60)
Glucose, Bld: 120 mg/dL — ABNORMAL HIGH (ref 70–99)
Potassium: 4.6 mmol/L (ref 3.5–5.1)
Sodium: 119 mmol/L — CL (ref 135–145)
Total Bilirubin: 23.9 mg/dL (ref 0.3–1.2)
Total Protein: 6.7 g/dL (ref 6.5–8.1)

## 2021-07-31 LAB — CBC
HCT: 41.9 % (ref 39.0–52.0)
Hemoglobin: 15.1 g/dL (ref 13.0–17.0)
MCH: 32.5 pg (ref 26.0–34.0)
MCHC: 36 g/dL (ref 30.0–36.0)
MCV: 90.3 fL (ref 80.0–100.0)
Platelets: 43 10*3/uL — ABNORMAL LOW (ref 150–400)
RBC: 4.64 MIL/uL (ref 4.22–5.81)
RDW: 18.6 % — ABNORMAL HIGH (ref 11.5–15.5)
WBC: 7.3 10*3/uL (ref 4.0–10.5)
nRBC: 0 % (ref 0.0–0.2)

## 2021-07-31 LAB — IRON AND TIBC
Iron: 77 ug/dL (ref 45–182)
Saturation Ratios: 25 % (ref 17.9–39.5)
TIBC: 302 ug/dL (ref 250–450)
UIBC: 225 ug/dL

## 2021-07-31 LAB — CBG MONITORING, ED
Glucose-Capillary: 127 mg/dL — ABNORMAL HIGH (ref 70–99)
Glucose-Capillary: 112 mg/dL — ABNORMAL HIGH (ref 70–99)

## 2021-07-31 LAB — GLUCOSE, CAPILLARY
Glucose-Capillary: 127 mg/dL — ABNORMAL HIGH (ref 70–99)
Glucose-Capillary: 105 mg/dL — ABNORMAL HIGH (ref 70–99)

## 2021-07-31 LAB — SODIUM
Sodium: 121 mmol/L — ABNORMAL LOW (ref 135–145)
Sodium: 120 mmol/L — ABNORMAL LOW (ref 135–145)

## 2021-07-31 LAB — FERRITIN: Ferritin: 1075 ng/mL — ABNORMAL HIGH (ref 24–336)

## 2021-07-31 IMAGING — MR MR ABDOMEN WO/W CM MRCP
19 of 21 series · 45 of 48 positions shown · IV contrast (gadavist)
Comparison: CT and ultrasound exams from [DATE]

CLINICAL DATA: Cholelithiasis on CT imaging.

EXAM:
MRI ABDOMEN WITHOUT AND WITH CONTRAST (INCLUDING MRCP)
TECHNIQUE: Multiplanar multisequence MR imaging of the abdomen was performed
both before and after the administration of intravenous contrast.
Heavily T2-weighted images of the biliary and pancreatic ducts were
obtained, and three-dimensional MRCP images were rendered by post
processing.
CONTRAST:  7.5mL GADAVIST GADOBUTROL 1 MMOL/ML IV SOLN

[Series 4: ax haste · axial · 6.0mm · 1.25mm/px · 1 of 36 slices shown]
[im 1/36]
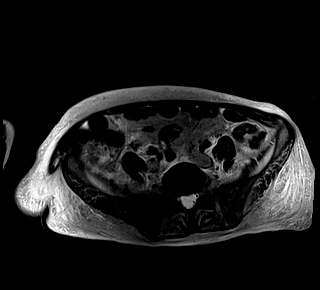

[Series 7: cor haste · coronal · 6.0mm · 1.34mm/px · 1 of 30 slices shown]
[im 1/30]
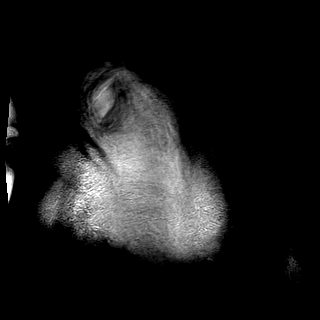

[Series 10: T2 fat-sat · axial · 6.0mm · 1.19mm/px · 1 of 32 slices shown]
[im 1/32]
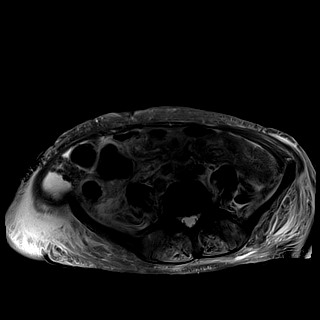

[Series 11: DWI · axial · 6.0mm · 1.57mm/px · z∈[-241,+11]mm · 3 of 108 slices shown (1 of 2)]
[im 1/108]
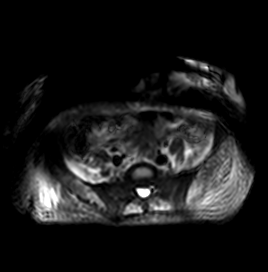
[im 54/108]
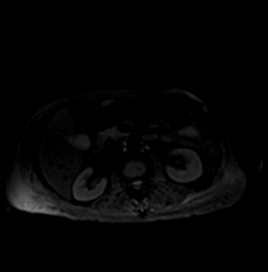
[im 108/108]
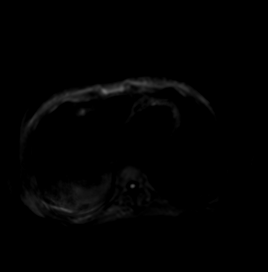

[Series 12: DWI · axial · 6.0mm · 1.57mm/px · 1 of 36 slices shown (2 of 2)]
[im 1/36]
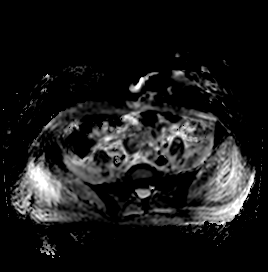

[Series 13: ax in and · axial · 3.0mm · 1.28mm/px · z∈[-233,+4]mm · 3 of 80 slices shown (1 of 2)]
[im 1/80]
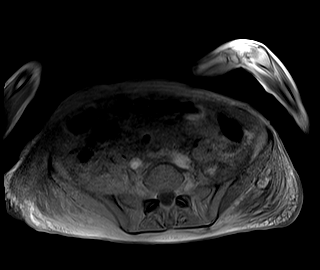
[im 40/80]
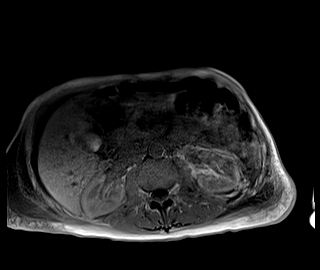
[im 80/80]
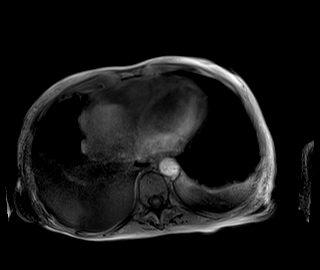

[Series 13: ax in and · axial · 3.0mm · 1.28mm/px · z∈[-233,+4]mm · 3 of 80 slices shown (2 of 2)]
[im 1/80]
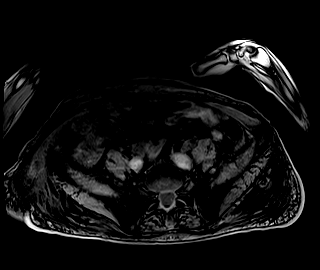
[im 40/80]
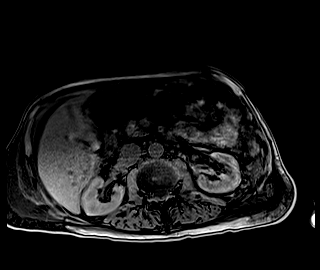
[im 80/80]
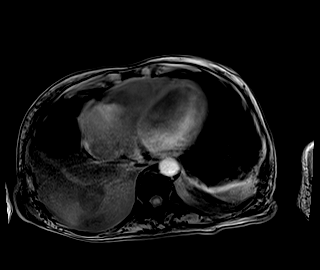

[Series 17: MRCP · coronal · 4.0mm · 1.12mm/px · 1 of 20 slices shown]
[im 1/20]
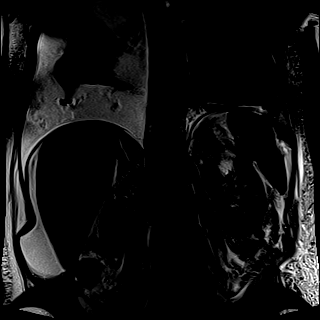

[Series 20: radials · coronal · 50.0mm · 0.78mm/px · 1 of 5 slices shown]
[im 1/5]
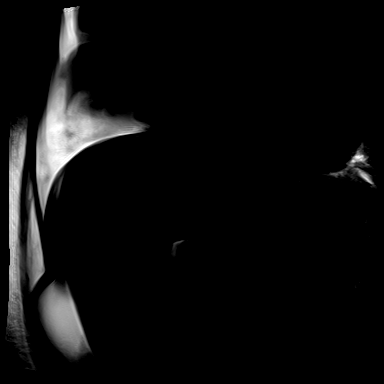

[Series 21: T1 dynamic · axial · non-contrast · 3.0mm · 1.19mm/px · z∈[-203,+10]mm · 3 of 72 slices shown (1 of 5)]
[im 1/72]
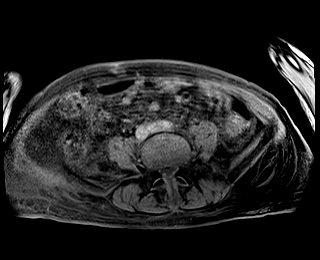
[im 36/72]
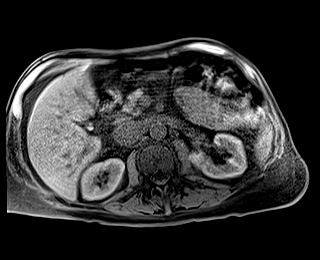
[im 72/72]
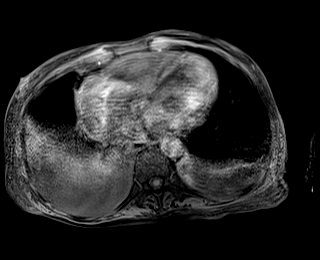

[Series 23: T1 dynamic post-contrast · axial · 3.0mm · 1.19mm/px · z∈[-203,+10]mm · 3 of 72 slices shown (1 of 5)]
[im 1/72]
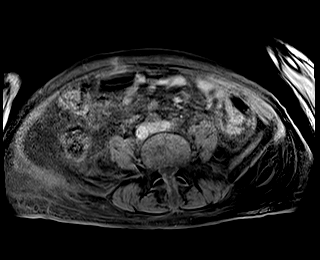
[im 36/72]
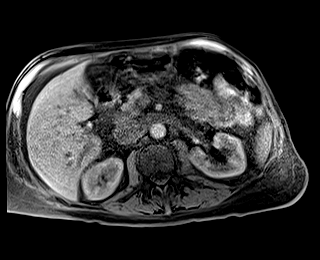
[im 72/72]
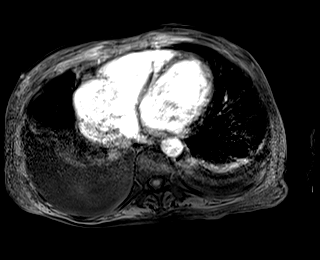

[Series 24: T1 dynamic · axial · 3.0mm · 1.19mm/px · z∈[-203,+10]mm · 3 of 72 slices shown (2 of 5)]
[im 1/72]
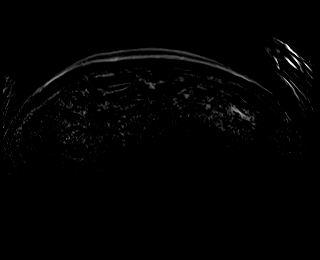
[im 36/72]
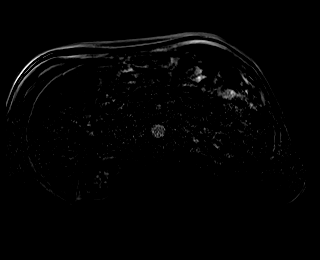
[im 72/72]
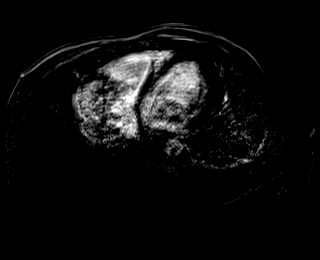

[Series 25: T1 dynamic post-contrast · axial · 3.0mm · 1.19mm/px · z∈[-203,+10]mm · 3 of 72 slices shown (2 of 5)]
[im 1/72]
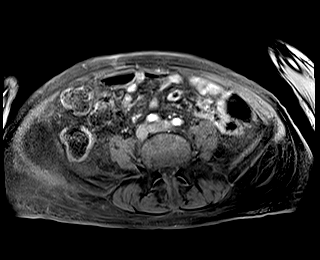
[im 36/72]
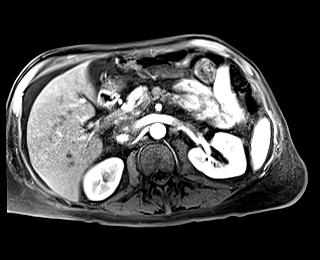
[im 72/72]
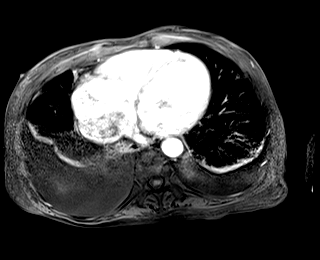

[Series 26: T1 dynamic · axial · 3.0mm · 1.19mm/px · z∈[-203,+10]mm · 3 of 72 slices shown (3 of 5)]
[im 1/72]
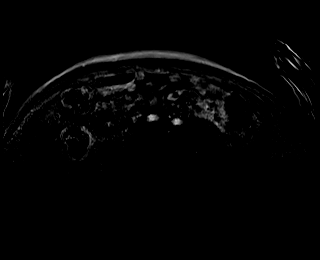
[im 36/72]
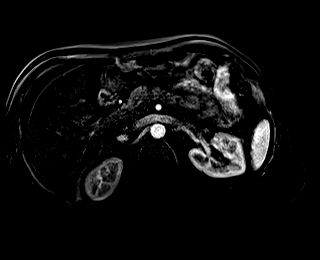
[im 72/72]
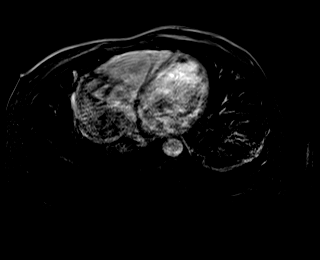

[Series 27: T1 dynamic post-contrast · axial · 3.0mm · 1.19mm/px · z∈[-203,+10]mm · 3 of 72 slices shown (3 of 5)]
[im 1/72]
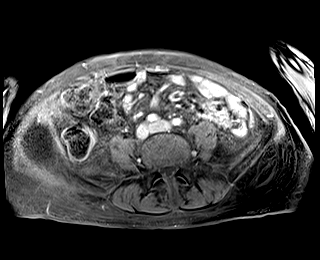
[im 36/72]
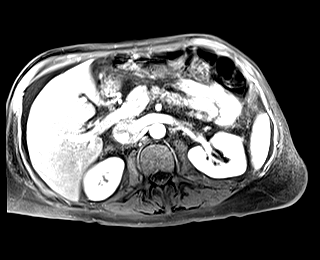
[im 72/72]
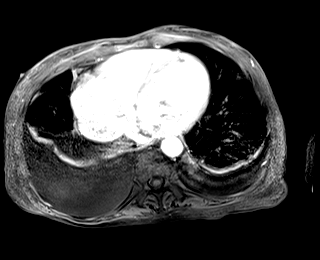

[Series 28: T1 dynamic · axial · 3.0mm · 1.19mm/px · z∈[-203,+10]mm · 3 of 72 slices shown (4 of 5)]
[im 1/72]
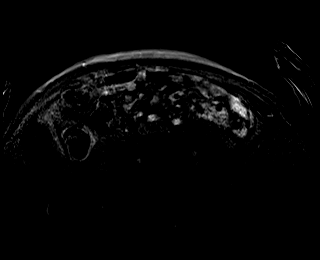
[im 36/72]
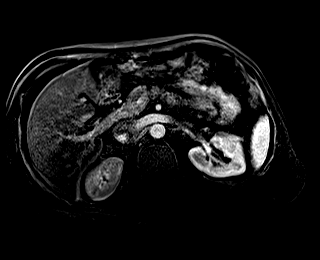
[im 72/72]
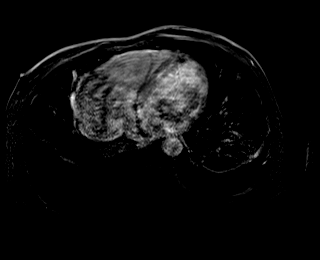

[Series 29: T1 dynamic post-contrast · axial · 3.0mm · 1.19mm/px · z∈[-203,+10]mm · 3 of 72 slices shown (4 of 5)]
[im 1/72]
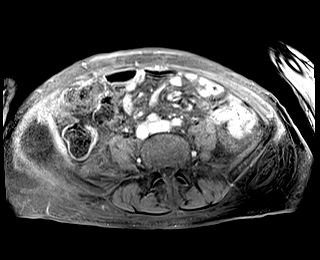
[im 36/72]
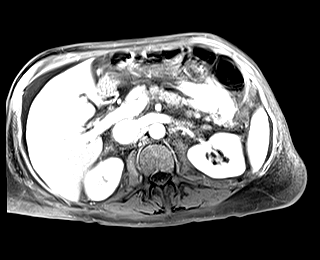
[im 72/72]
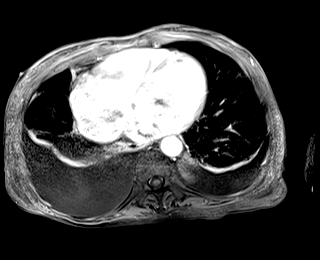

[Series 30: T1 dynamic · axial · 3.0mm · 1.19mm/px · z∈[-203,+10]mm · 3 of 72 slices shown (5 of 5)]
[im 1/72]
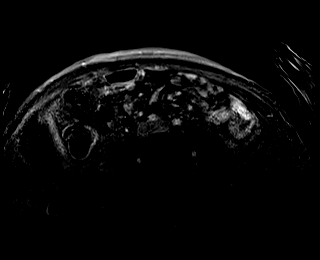
[im 36/72]
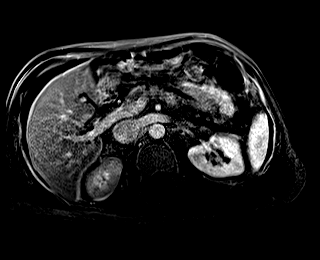
[im 72/72]
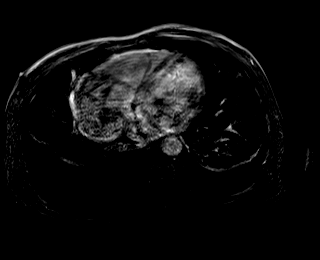

[Series 31: T1 dynamic post-contrast · coronal · 3.0mm · 1.31mm/px · 3 of 72 slices shown (5 of 5)]
[im 1/72]
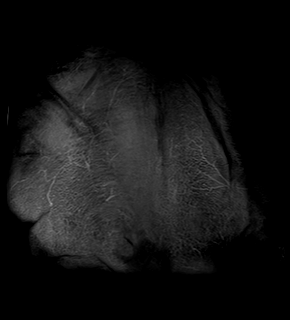
[im 36/72]
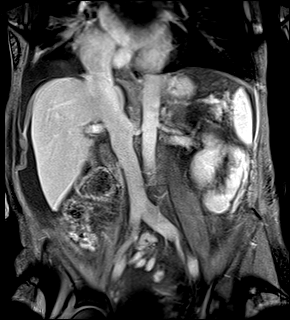
[im 72/72]
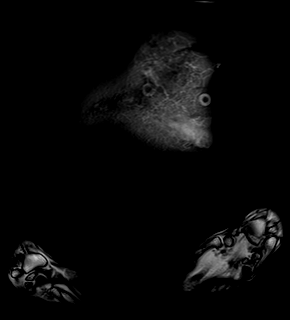

[45 of 48 positions shown; findings below may reference images not displayed]

FINDINGS: Lower chest: Heart is enlarged. Moderate to large bilateral pleural
effusions evident.

Hepatobiliary: No suspicious focal abnormality within the liver
parenchyma. Small focus of differential perfusion identified in the
subcapsular liver along the gallbladder fundus, likely benign
transient hepatic intensity difference. 1.6 x 1.2 cm gallstone
evident. No substantial gallbladder wall thickening or
pericholecystic fluid although there is free fluid around the liver.
No intrahepatic or extrahepatic biliary dilation.

Pancreas: No focal mass lesion. No dilatation of the main duct. No
intraparenchymal cyst. No peripancreatic edema.

Spleen:  No splenomegaly. No focal mass lesion.

Adrenals/Urinary Tract: No adrenal nodule or mass. Right kidney
unremarkable. 5 cm simple cyst noted upper pole left kidney
(B[place]ni[place]) No followup recommended.

Stomach/Bowel: Stomach is unremarkable. No gastric wall thickening.
No evidence of outlet obstruction. Duodenum is normally positioned
as is the ligament of Treitz. No small bowel or colonic dilatation
within the visualized abdomen.

Vascular/Lymphatic: No abdominal aortic aneurysm. No abdominal
lymphadenopathy.

Other: Diffuse mesenteric edema is associated with small to moderate
volume ascites.

Musculoskeletal: Diffuse body wall edema evident. No focal
suspicious marrow enhancement within the visualized bony anatomy.
IMPRESSION: 1. Cholelithiasis without intrahepatic or extrahepatic biliary
dilation. No findings to suggest choledocholithiasis.
2. Moderate to large bilateral pleural effusions.
3. Diffuse mesenteric edema with small to moderate volume ascites.
4. Diffuse body wall edema.

## 2021-07-31 MED ORDER — GADOBUTROL 1 MMOL/ML IV SOLN
7.5000 mL | Freq: Once | INTRAVENOUS | Status: AC | PRN
  Administered 2021-07-31: 7.5 mL via INTRAVENOUS

## 2021-07-31 MED ORDER — IPRATROPIUM-ALBUTEROL 0.5-2.5 (3) MG/3ML IN SOLN: 3.0000 mL | RESPIRATORY_TRACT | Status: DC | PRN

## 2021-07-31 MED ORDER — HYDRALAZINE HCL 20 MG/ML IJ SOLN: 10.0000 mg | INTRAMUSCULAR | Status: DC | PRN

## 2021-07-31 MED ORDER — SODIUM CHLORIDE 0.9 % IV SOLN: INTRAVENOUS | Status: DC

## 2021-07-31 MED ORDER — SENNOSIDES-DOCUSATE SODIUM 8.6-50 MG PO TABS: 1.0000 | ORAL_TABLET | Freq: Every evening | ORAL | Status: DC | PRN

## 2021-07-31 MED ORDER — METOPROLOL TARTRATE 5 MG/5ML IV SOLN: 5.0000 mg | INTRAVENOUS | Status: DC | PRN

## 2021-07-31 MED ORDER — GUAIFENESIN 100 MG/5ML PO LIQD
5.0000 mL | ORAL | Status: DC | PRN
  Administered 2021-07-31 – 2021-08-01 (×3): 5 mL via ORAL
  Filled 2021-07-31 (×2): qty 5
  Filled 2021-07-31 (×2): qty 15

## 2021-07-31 MED ORDER — TRAZODONE HCL 50 MG PO TABS
50.0000 mg | ORAL_TABLET | Freq: Every evening | ORAL | Status: DC | PRN
  Administered 2021-07-31 – 2021-08-06 (×3): 50 mg via ORAL
  Filled 2021-07-31 (×3): qty 1

## 2021-07-31 NOTE — ED Notes (Signed)
Pt provided a cup of water, a cup of lemon lime, and a pitcher of water at this time

## 2021-07-31 NOTE — ED Notes (Signed)
Pt sheets changed, draw sheet and chuck placed, removed pt shorts, under wear, and socks, placed hosp socks at this time. Pt feels cool to the touch and is c/o being very cold rectal temp 96.4. pt provided multiple warm blankets.

## 2021-07-31 NOTE — Evaluation (Signed)
Physical Therapy Evaluation Patient Details Name: Erik Chan MRN: [id number] DOB: 12-13-1955 Today's Date: 07/31/2021  History of Present Illness  Pt is a 66 y/o male admitted secondary to jaundice and acute hepatitis. Had MRCP. PMH includes CHF, COPD, and DM.  Clinical Impression  Pt admitted secondary to problem above with deficits below. Pt requiring mod A for bed mobility and transfers this session. Pt very fatigued and wanting to defer further mobility. Given pt's deficits, recommending SNF level therapies. However, if pt progresses well, may be able to consider HHPT. Will continue to follow acutely and update recommendations based on pt progression.        Recommendations for follow up therapy are one component of a multi-disciplinary discharge planning process, led by the attending physician.  Recommendations may be updated based on patient status, additional functional criteria and insurance authorization.  Follow Up Recommendations Skilled nursing-short term rehab (<3 hours/day) Can patient physically be transported by private vehicle: No    Assistance Recommended at Discharge Frequent or constant Supervision/Assistance  Patient can return home with the following  A lot of help with walking and/or transfers;A lot of help with bathing/dressing/bathroom;Assistance with cooking/housework;Help with stairs or ramp for entrance;Assist for transportation    Equipment Recommendations None recommended by PT  Recommendations for Other Services       Functional Status Assessment Patient has had a recent decline in their functional status and demonstrates the ability to make significant improvements in function in a reasonable and predictable amount of time.     Precautions / Restrictions Precautions Precautions: Fall Restrictions Weight Bearing Restrictions: No      Mobility  Bed Mobility Overal bed mobility: Needs Assistance Bed Mobility: Supine to Sit, Sit to Supine      Supine to sit: Mod assist Sit to supine: Mod assist   General bed mobility comments: mod A for trunk and LE assist. Increased time to perform.    Transfers Overall transfer level: Needs assistance Equipment used: 1 person hand held assist Transfers: Sit to/from Stand Sit to Stand: Mod assist           General transfer comment: mod A for lift assist and steadying. Pt not wanting to attempt further mobility, so returned to sitting.    Ambulation/Gait                  Stairs            Wheelchair Mobility    Modified Rankin (Stroke Patients Only)       Balance Overall balance assessment: Needs assistance Sitting-balance support: No upper extremity supported, Feet supported Sitting balance-Leahy Scale: Fair     Standing balance support: Bilateral upper extremity supported Standing balance-Leahy Scale: Poor Standing balance comment: Reliant on UE and external support                             Pertinent Vitals/Pain Pain Assessment Pain Assessment: No/denies pain    Home Living Family/patient expects to be discharged to:: Private residence Living Arrangements: Spouse/significant other Available Help at Discharge: Family;Available 24 hours/day Type of Home: House Home Access: Stairs to enter Entrance Stairs-Rails: None Entrance Stairs-Number of Steps: 1   Home Layout: One level Home Equipment: Teacher, English as a foreign language (2 wheels)      Prior Function Prior Level of Function : Independent/Modified Independent;Driving             Mobility Comments: Using RW for mobility tasks ADLs Comments: Pt  reports independence     Hand Dominance        Extremity/Trunk Assessment   Upper Extremity Assessment Upper Extremity Assessment: Defer to OT evaluation    Lower Extremity Assessment Lower Extremity Assessment: Generalized weakness    Cervical / Trunk Assessment Cervical / Trunk Assessment: Kyphotic  Communication    Communication: No difficulties  Cognition Arousal/Alertness: Awake/alert Behavior During Therapy: WFL for tasks assessed/performed Overall Cognitive Status: No family/caregiver present to determine baseline cognitive functioning                                          General Comments      Exercises     Assessment/Plan    PT Assessment Patient needs continued PT services  PT Problem List Decreased strength;Decreased balance;Decreased activity tolerance;Decreased mobility;Decreased knowledge of use of DME;Decreased knowledge of precautions       PT Treatment Interventions DME instruction;Gait training;Functional mobility training;Therapeutic activities;Balance training;Therapeutic exercise;Patient/family education    PT Goals (Current goals can be found in the Care Plan section)  Acute Rehab PT Goals Patient Stated Goal: to feel better PT Goal Formulation: With patient Time For Goal Achievement: 08/14/21 Potential to Achieve Goals: Good    Frequency Min 3X/week     Co-evaluation               AM-PAC PT "6 Clicks" Mobility  Outcome Measure Help needed turning from your back to your side while in a flat bed without using bedrails?: A Lot Help needed moving from lying on your back to sitting on the side of a flat bed without using bedrails?: A Lot Help needed moving to and from a bed to a chair (including a wheelchair)?: A Lot Help needed standing up from a chair using your arms (e.g., wheelchair or bedside chair)?: A Lot Help needed to walk in hospital room?: A Lot Help needed climbing 3-5 steps with a railing? : Total 6 Click Score: 11    End of Session Equipment Utilized During Treatment: Gait belt Activity Tolerance: Patient limited by fatigue Patient left: in bed;with call bell/phone within reach (on stretcher in ED) Nurse Communication: Mobility status PT Visit Diagnosis: Unsteadiness on feet (R26.81);Muscle weakness (generalized)  (M62.81);Difficulty in walking, not elsewhere classified (R26.2)    Time: 3875-6433 PT Time Calculation (min) (ACUTE ONLY): 24 min   Charges:   PT Evaluation $PT Eval Moderate Complexity: 1 Mod PT Treatments $Therapeutic Activity: 8-22 mins        Cindee Salt, DPT  Acute Rehabilitation Services  Office: [phone]   Lehman Prom 07/31/2021, 2:02 PM

## 2021-07-31 NOTE — ED Notes (Signed)
Admit provider previously stated to pt on fluid restriction due to current lab results. Message sent in reference if he wanted to stop the maintenance fluid that was running at this time

## 2021-07-31 NOTE — ED Notes (Signed)
Pt c/o of coughing, no SOB or wheezing present. Admitting MD notified. No new orders at this time. Plan of care continues.

## 2021-07-31 NOTE — ED Notes (Signed)
I wasn't able to get patient blood. The Nurse was informed. 

## 2021-07-31 NOTE — Progress Notes (Signed)
PROGRESS NOTE    Erik Chan  HGD:[id number] DOB: 1955-07-31 DOA: 07/30/2021 PCP: [name], MD   Brief Narrative:  67 year old with history of COPD, nonischemic cardiomyopathy with reduced EF 20% comes to the ER with painless jaundice and loss of appetite.  Apparently patient felt very weak on 6/15 and laid on the floor and was unable to get back in her bed.  EMS was called to help with this.  Due to progressive weakness patient was brought to the hospital.  Upon admission patient was noted to be hyponatremic, elevated creatinine, transaminitis.  Ammonia level was normal.  Right upper quadrant ultrasound showed pleural effusion, ascites.  GI was consulted.   Assessment & Plan:  Principal Problem:   Acute hepatitis Active Problems:   Chronic systolic CHF (congestive heart failure) (HCC)   AKI (acute kidney injury) (HCC)   Hyponatremia   Coagulopathy (HCC)   Chronic right heart failure (HCC)   COPD (chronic obstructive pulmonary disease) (HCC)   Non-insulin dependent type 2 diabetes mellitus (HCC)   Recurrent right pleural effusion       Acute hepatitis with elevated total bilirubin -Suspect cardiac in nature. - CT abdomen pelvis-bilateral pleural effusion, hepatic steatosis, gallstones - Viral panel-negative - [facility] following. Autoimmune panel ordered - Right upper quadrant ultrasound showed hepatic steatosis, large gallstone - MRCP is neg for obs stones, showed effusion, ascites, bowel wall edema.   Hypovolemic hyponatremia -Secondary to hypovolemia.  Urine sodium is less than 10.  We will give normal saline.   Acute kidney injury -Admission creatinine 1.6.  Baseline 1.0.  Suspect from dehydration, prerenal.  Give IV fluids.   Chronic systolic CHF; chronic RV failure  - NICM with LV EF 20-25% and severely reduced RV systolic function on TTE in April 2023.  Patient has some evidence of fluid overload but unable to diuresis due to elevated creatinine.  IV fluids with  caution   COPD -Not in exacerbation.  As needed bronchodilators.   Recurrent right-sided pleural effusion -Closely monitor this.  Patient needs IV fluids due to hyponatremia.   Diabetes mellitus type 2 -Hemoglobin A1c 6.7 in January 2023.  On sliding scale and Accu-Cheks.  Given advanced comorbidities, palliative care team consulted to help.-Goals of care.   DVT prophylaxis: SCDs Start: 07/30/21 2025 Code Status: Full Code Family Communication:    Status is: Inpatient Remains inpatient appropriate because: Decompensated cirrhosis, maintain hosp stay until cleard by GI   Subjective: Feels ok, but still somewhat sluggish in responses     Examination:  General exam: Appears calm and comfortable ; b/l temporal wasting.  Respiratory system: Clear to auscultation. Respiratory effort normal. Cardiovascular system: S1 & S2 heard, RRR. No JVD, murmurs, rubs, gallops or clicks. No pedal edema. Gastrointestinal system: Abdomen is nondistended, soft and nontender. No organomegaly or masses felt. Normal bowel sounds heard. Central nervous system: Alert and oriented. No focal neurological deficits. Extremities: Symmetric 5 x 5 power. Skin: Jaundiced Psychiatry: Judgement and insight appear normal. Mood & affect appropriate.     Objective: Vitals:   07/31/21 0400 07/31/21 0500 07/31/21 0530 07/31/21 0600  BP: 98/63 97/61  97/62  Pulse: 65 64  64  Resp: 20 14  16   Temp:   (!) 96.4 F (35.8 C)   TempSrc:   Rectal   SpO2: 95% 100%  97%  Weight:      Height:        Intake/Output Summary (Last 24 hours) at 07/31/2021 0827 Last data filed at 07/31/2021  0725 Gross per 24 hour  Intake 647.15 ml  Output --  Net 647.15 ml   Filed Weights   07/30/21 1650  Weight: 74.4 kg     Data Reviewed:   CBC: Recent Labs  Lab 07/30/21 1722 07/31/21 0310  WBC 6.5 7.3  NEUTROABS 5.2  --   HGB 15.0 15.1  HCT 41.7 41.9  MCV 89.9 90.3  PLT PLATELET CLUMPS NOTED ON SMEAR, COUNT  APPEARS DECREASED 43*   Basic Metabolic Panel: Recent Labs  Lab 07/30/21 1722 07/31/21 0310  NA 122* 119*  K 4.6 4.6  CL 84* 86*  CO2 23 21*  GLUCOSE 138* 120*  BUN 67* 67*  CREATININE 1.60* 1.46*  CALCIUM 8.9 8.5*   GFR: Estimated Creatinine Clearance: 52.4 mL/min (A) (by C-G formula based on SCr of 1.46 mg/dL (H)). Liver Function Tests: Recent Labs  Lab 07/30/21 1722 07/31/21 0310  AST 203* 184*  ALT 514* 446*  ALKPHOS 116 103  BILITOT 25.9* 23.9*  PROT 7.4 6.7  ALBUMIN 3.5 3.2*   Recent Labs  Lab 07/30/21 2150  LIPASE 56*   Recent Labs  Lab 07/30/21 1722  AMMONIA 22   Coagulation Profile: Recent Labs  Lab 07/30/21 1722  INR 1.6*   Cardiac Enzymes: No results for input(s): "CKTOTAL", "CKMB", "CKMBINDEX", "TROPONINI" in the last 168 hours. BNP (last 3 results) No results for input(s): "PROBNP" in the last 8760 hours. HbA1C: No results for input(s): "HGBA1C" in the last 72 hours. CBG: Recent Labs  Lab 07/30/21 2247 07/31/21 0737  GLUCAP 147* 127*   Lipid Profile: No results for input(s): "CHOL", "HDL", "LDLCALC", "TRIG", "CHOLHDL", "LDLDIRECT" in the last 72 hours. Thyroid Function Tests: No results for input(s): "TSH", "T4TOTAL", "FREET4", "T3FREE", "THYROIDAB" in the last 72 hours. Anemia Panel: No results for input(s): "VITAMINB12", "FOLATE", "FERRITIN", "TIBC", "IRON", "RETICCTPCT" in the last 72 hours. Sepsis Labs: No results for input(s): "PROCALCITON", "LATICACIDVEN" in the last 168 hours.  No results found for this or any previous visit (from the past 240 hour(s)).       Radiology Studies: CT ABDOMEN PELVIS WO CONTRAST  Result Date: 07/30/2021 CLINICAL DATA:  Ascites and jaundice. EXAM: CT ABDOMEN AND PELVIS WITHOUT CONTRAST TECHNIQUE: Multidetector CT imaging of the abdomen and pelvis was performed following the standard protocol without IV contrast. RADIATION DOSE REDUCTION: This exam was performed according to the departmental  dose-optimization program which includes automated exposure control, adjustment of the mA and/or kV according to patient size and/or use of iterative reconstruction technique. COMPARISON:  Abdominal ultrasound dated 07/30/2021. FINDINGS: Evaluation of this exam is limited in the absence of intravenous contrast. Lower chest: Partially visualized moderate right and small left pleural effusions with partial compressive atelectasis of the lung bases versus pneumonia. There is mild cardiomegaly with mild biatrial dilatation. No intra-abdominal free air. Diffuse mesenteric edema and small ascites. Hepatobiliary: The liver is unremarkable. No biliary ductal dilatation. There is a gallstone. High attenuating content within the gallbladder most consistent with vicarious excretion of recently administered contrast. Pancreas: The pancreas is unremarkable as visualized. Spleen: Normal in size without focal abnormality. Adrenals/Urinary Tract: The adrenal glands are unremarkable. There is no hydronephrosis or nephrolithiasis on either side. There is a 4.5 cm left renal upper pole cyst. The visualized ureters and urinary bladder appear unremarkable. Stomach/Bowel: There is sigmoid diverticulosis. There is no bowel obstruction. The appendix is normal. Vascular/Lymphatic: Mild aortoiliac atherosclerotic disease. The IVC is unremarkable. No portal venous gas. There is no adenopathy. Reproductive: The prostate  and seminal vesicles are grossly unremarkable. No pelvic mass. Other: Diffuse mesenteric and subcutaneous edema and anasarca. Musculoskeletal: No acute or significant osseous findings. IMPRESSION: 1. Bilateral pleural effusions, small ascites, and anasarca. 2. Cholelithiasis. 3. Sigmoid diverticulosis. No bowel obstruction. Normal appendix. 4. Aortic Atherosclerosis (ICD10-I70.0). Electronically Signed   By: Elgie Collard M.D.   On: 07/30/2021 22:16   US Abdomen Limited RUQ (LIVER/GB)  Result Date: 07/30/2021 CLINICAL  DATA:  Jaundice EXAM: ULTRASOUND ABDOMEN LIMITED RIGHT UPPER QUADRANT COMPARISON:  Abdominal ultrasound 01/30/2021 FINDINGS: Gallbladder: 2.2 cm echogenic shadowing calculus. Low-level echogenic sludge. No wall thickening or sonographic Murphy's sign. Common bile duct: Diameter: 4 mm Liver: Increased echogenicity of the parenchyma with no focal mass identified. Portal vein is patent on color Doppler imaging with normal direction of blood flow towards the liver. Other: Right pleural effusion.  Ascites. IMPRESSION: 1. Right pleural effusion. 2. Ascites. 3. Increased echogenicity of the liver parenchyma which may represent hepatic steatosis and/or other hepatocellular disease. 4. Large gallstone.  Gallbladder sludge. Electronically Signed   By: Jannifer Hick M.D.   On: 07/30/2021 19:11        Scheduled Meds:  insulin aspart  0-6 Units Subcutaneous TID WC   sodium chloride flush  3 mL Intravenous Q12H   Continuous Infusions:  sodium chloride 75 mL/hr at 07/31/21 0725     LOS: 1 day   Time spent= 35 mins    Kristyl Athens Joline Maxcy, MD [facility]  If 7PM-7AM, please contact night-coverage  07/31/2021, 8:27 AM

## 2021-07-31 NOTE — Consult Note (Signed)
Referring Provider: [facility] Primary Care Physician:  Kathyrn Lass, MD Primary Gastroenterologist:  Althia Forts  Reason for Consultation:  Painless Jaundice  HPI: Erik Chan is a 66 y.o. male with medical history significant for COPD and nonischemic cardiomyopathy with EF 20 to 25% and severely reduced RV systolic function, now presenting to the emergency department for evaluation of jaundice, fatigue, and loss of appetite.   Patient reports for the last 2 weeks he has been feeling fatigued and noticed that his urine has been dark and he has been turning yellow.  Denies pain.  Denies nausea/vomiting.  Denies weight loss, states his weight has been stable.  He states he has been very weak.  Denies NSAID use, alcohol use, tobacco use.  States he is an avid vitamin taker and " naturalist."  Denies previous history of colonoscopy/endoscopy.  Denies fever.  Denies acetaminophen use.  Denies new medications.   Past Medical History:  Diagnosis Date   CHF (congestive heart failure) ([place]) 02/13/2021   LVEF less than 20%   COPD (chronic obstructive pulmonary disease) (HCC)    Fibromyalgia     Past Surgical History:  Procedure Laterality Date   RIGHT/LEFT HEART CATH AND CORONARY ANGIOGRAPHY N/A 02/19/2021   Procedure: RIGHT/LEFT HEART CATH AND CORONARY ANGIOGRAPHY;  Surgeon: Jolaine Artist, MD;  Location: [facility];  Service: Cardiovascular;  Laterality: N/A;    Prior to Admission medications   Medication Sig Start Date End Date Taking? Authorizing Provider  acetaminophen (TYLENOL) 500 MG tablet Take 500 mg by mouth every 6 (six) hours as needed for mild pain, fever or headache.   Yes [provider]  albuterol (VENTOLIN HFA) 108 (90 Base) MCG/ACT inhaler Inhale 1-2 puffs into the lungs every 4 (four) hours as needed for wheezing or shortness of breath. 01/28/21  Yes [provider]  amiodarone (PACERONE) 200 MG tablet Take 1 tablet (200 mg total) by mouth daily.  05/30/21  Yes Bensimhon, Shaune Pascal, MD  empagliflozin (JARDIANCE) 10 MG TABS tablet Take 1 tablet (10 mg total) by mouth daily. 03/26/21  Yes Larey Dresser, MD  mexiletine (MEXITIL) 200 MG capsule Take 1 capsule (200 mg total) by mouth every 12 (twelve) hours. 03/26/21  Yes Larey Dresser, MD  midodrine (PROAMATINE) 5 MG tablet Take 1 tablet (5 mg total) by mouth 3 (three) times daily with meals. 04/15/21  Yes Mil[place], Maricela Bo, FNP  spironolactone (ALDACTONE) 25 MG tablet Take 1 tablet (25 mg total) by mouth daily. 03/26/21  Yes Larey Dresser, MD    Scheduled Meds:  insulin aspart  0-6 Units Subcutaneous TID WC   sodium chloride flush  3 mL Intravenous Q12H   Continuous Infusions:  sodium chloride 75 mL/hr at 07/31/21 0725   PRN Meds:.albuterol, ondansetron (ZOFRAN) IV  Allergies as of 07/30/2021 - Review Complete 07/30/2021  Allergen Reaction Noted   Shellfish-derived products Hives and Rash 03/19/2014    Family History  Problem Relation Age of Onset   Asthma Mother    Allergies Mother    Fibromyalgia Mother    Osteoarthritis Mother    Heart Problems Father    Osteoarthritis Maternal Grandmother     Social History   Socioeconomic History   Marital status: Married    Spouse name: Not on file   Number of children: Not on file   Years of education: Not on file   Highest education level: Not on file  Occupational History   Occupation: Banker   Tobacco Use  Smoking status: Former    Packs/day: 1.00    Years: 30.00    Total pack years: 30.00    Types: Pipe, Cigarettes   Smokeless tobacco: Never  Vaping Use   Vaping Use: Never used  Substance and Sexual Activity   Alcohol use: No    Alcohol/week: 0.0 standard drinks of alcohol   Drug use: No   Sexual activity: Not on file  Other Topics Concern   Not on file  Social History Narrative   Not on file   Social Determinants of Health   Financial Resource Strain: Not on file  Food Insecurity: Not on file   Transportation Needs: Not on file  Physical Activity: Not on file  Stress: Not on file  Social Connections: Not on file  Intimate Partner Violence: Not on file    Review of Systems: Review of Systems  Constitutional:  Positive for malaise/fatigue. Negative for chills, fever and weight loss.  HENT:  Negative for hearing loss and tinnitus.   Eyes:  Negative for blurred vision and double vision.  Respiratory:  Negative for cough and hemoptysis.   Cardiovascular:  Negative for chest pain and palpitations.  Gastrointestinal:  Negative for abdominal pain, blood in stool, constipation, diarrhea, heartburn, melena, nausea and vomiting.  Genitourinary:  Negative for dysuria and urgency.  Musculoskeletal:  Negative for myalgias and neck pain.  Skin:  Negative for itching and rash.  Neurological:  Positive for weakness. Negative for seizures and loss of consciousness.  Psychiatric/Behavioral:  Negative for depression and suicidal ideas.     Physical Exam:Physical Exam Constitutional:      Appearance: He is ill-appearing.  HENT:     Head: Normocephalic and atraumatic.     Nose: Nose normal. No congestion.     Mouth/Throat:     Mouth: Mucous membranes are moist.     Pharynx: Oropharynx is clear.  Eyes:     General: Scleral icterus present.     Extraocular Movements: Extraocular movements intact.  Cardiovascular:     Rate and Rhythm: Normal rate and regular rhythm.  Pulmonary:     Effort: Pulmonary effort is normal. No respiratory distress.  Abdominal:     General: Bowel sounds are normal. There is no distension.     Palpations: Abdomen is soft. There is no mass.     Tenderness: There is no abdominal tenderness. There is no guarding or rebound.     Hernia: No hernia is present.  Musculoskeletal:        General: No swelling. Normal range of motion.     Cervical back: Normal range of motion and neck supple.  Skin:    General: Skin is warm and dry.     Coloration: Skin is jaundiced.   Neurological:     General: No focal deficit present.     Mental Status: He is alert and oriented to person, place, and time.  Psychiatric:        Mood and Affect: Mood normal.        Behavior: Behavior normal.        Thought Content: Thought content normal.        Judgment: Judgment normal.     Vital signs: Vitals:   07/31/21 0530 07/31/21 0600  BP:  97/62  Pulse:  64  Resp:  16  Temp: (!) 96.4 F (35.8 C)   SpO2:  97%        GI:  Lab Results: Recent Labs    07/30/21 1722 07/31/21 0310  WBC 6.5 7.3  HGB 15.0 15.1  HCT 41.7 41.9  PLT PLATELET CLUMPS NOTED ON SMEAR, COUNT APPEARS DECREASED 43*   BMET Recent Labs    07/30/21 1722 07/31/21 0310  NA 122* 119*  K 4.6 4.6  CL 84* 86*  CO2 23 21*  GLUCOSE 138* 120*  BUN 67* 67*  CREATININE 1.60* 1.46*  CALCIUM 8.9 8.5*   LFT Recent Labs    07/31/21 0310  PROT 6.7  ALBUMIN 3.2*  AST 184*  ALT 446*  ALKPHOS 103  BILITOT 23.9*   PT/INR Recent Labs    07/30/21 1722  LABPROT 18.7*  INR 1.6*     Studies/Results: CT ABDOMEN PELVIS WO CONTRAST  Result Date: 07/30/2021 CLINICAL DATA:  Ascites and jaundice. EXAM: CT ABDOMEN AND PELVIS WITHOUT CONTRAST TECHNIQUE: Multidetector CT imaging of the abdomen and pelvis was performed following the standard protocol without IV contrast. RADIATION DOSE REDUCTION: This exam was performed according to the departmental dose-optimization program which includes automated exposure control, adjustment of the mA and/or kV according to patient size and/or use of iterative reconstruction technique. COMPARISON:  Abdominal ultrasound dated 07/30/2021. FINDINGS: Evaluation of this exam is limited in the absence of intravenous contrast. Lower chest: Partially visualized moderate right and small left pleural effusions with partial compressive atelectasis of the lung bases versus pneumonia. There is mild cardiomegaly with mild biatrial dilatation. No intra-abdominal free air. Diffuse  mesenteric edema and small ascites. Hepatobiliary: The liver is unremarkable. No biliary ductal dilatation. There is a gallstone. High attenuating content within the gallbladder most consistent with vicarious excretion of recently administered contrast. Pancreas: The pancreas is unremarkable as visualized. Spleen: Normal in size without focal abnormality. Adrenals/Urinary Tract: The adrenal glands are unremarkable. There is no hydronephrosis or nephrolithiasis on either side. There is a 4.5 cm left renal upper pole cyst. The visualized ureters and urinary bladder appear unremarkable. Stomach/Bowel: There is sigmoid diverticulosis. There is no bowel obstruction. The appendix is normal. Vascular/Lymphatic: Mild aortoiliac atherosclerotic disease. The IVC is unremarkable. No portal venous gas. There is no adenopathy. Reproductive: The prostate and seminal vesicles are grossly unremarkable. No pelvic mass. Other: Diffuse mesenteric and subcutaneous edema and anasarca. Musculoskeletal: No acute or significant osseous findings. IMPRESSION: 1. Bilateral pleural effusions, small ascites, and anasarca. 2. Cholelithiasis. 3. Sigmoid diverticulosis. No bowel obstruction. Normal appendix. 4. Aortic Atherosclerosis (ICD10-I70.0). Electronically Signed   By: Anner Crete M.D.   On: 07/30/2021 22:16   US Abdomen Limited RUQ (LIVER/GB)  Result Date: 07/30/2021 CLINICAL DATA:  Jaundice EXAM: ULTRASOUND ABDOMEN LIMITED RIGHT UPPER QUADRANT COMPARISON:  Abdominal ultrasound 01/30/2021 FINDINGS: Gallbladder: 2.2 cm echogenic shadowing calculus. Low-level echogenic sludge. No wall thickening or sonographic Murphy's sign. Common bile duct: Diameter: 4 mm Liver: Increased echogenicity of the parenchyma with no focal mass identified. Portal vein is patent on color Doppler imaging with normal direction of blood flow towards the liver. Other: Right pleural effusion.  Ascites. IMPRESSION: 1. Right pleural effusion. 2. Ascites. 3.  Increased echogenicity of the liver parenchyma which may represent hepatic steatosis and/or other hepatocellular disease. 4. Large gallstone.  Gallbladder sludge. Electronically Signed   By: Ofilia Neas M.D.   On: 07/30/2021 19:11    Impression: Painless jaundice -RUQ ultrasound: Ascites, fatty liver, large gallstone and gallbladder sludge -CT abdomen pelvis without contrast shows bilateral pleural effusions, small ascites, cholelithiasis, sigmoid diverticulosis -MRCP shows cholelithiasis without biliary dilation.  No choledocholithiasis.  Moderate to large bilateral pleural effusions.  Diffuse mesenteric edema with small to  moderate volume ascites, diffuse body wall edema. -Hgb 15.1 -Platelets 43 -AST 184/ALT 446/alk phos 103 -T. bili 23.9 -Negative hepatitis panel -Lipase 56 -BUN 67, creatinine 1.46 -INR 1.6   Plan: Elevated LFTs with unclear etiology of painless jaundice.  RUQ ultrasound, CT, MRCP negative for choledocholithiasis or biliary dilation. We will obtain further lab evaluation of elevated LFTs including alpha 1 antitrypsin, ceruloplasmin, iron studies, ANA/ASMA Continue supportive care [facility] will follow    LOS: 1 day   Bayley Radford Pax  PA-C 07/31/2021, 8:11 AM  Contact #  [phone]

## 2021-07-31 NOTE — Progress Notes (Signed)
Receiving RN and charge RN noted that patient had a rectal temp of 96.4 at 0530 this morning in ED. Repeat temp was 97.1 axillary at 1448, no repeat temp documented in patients chart before 1448. It was reported that patients temp has been low all day. Charge RN made aware.  Jean Rosenthal, RN

## 2021-07-31 NOTE — ED Notes (Signed)
Pt transported to MRI 

## 2021-07-31 NOTE — ED Notes (Signed)
PT at bedside.

## 2021-08-01 ENCOUNTER — Encounter (HOSPITAL_COMMUNITY): Payer: Self-pay | Admitting: Internal Medicine

## 2021-08-01 ENCOUNTER — Encounter (HOSPITAL_COMMUNITY)
Admission: EM | Disposition: A | Discharge status: Skilled Nursing Facility | Payer: Self-pay | Source: Home / Self Care | Attending: Family Medicine

## 2021-08-01 DIAGNOSIS — N179 Acute kidney failure, unspecified: Secondary | ICD-10-CM | POA: Diagnosis not present

## 2021-08-01 DIAGNOSIS — Z789 Other specified health status: Secondary | ICD-10-CM

## 2021-08-01 DIAGNOSIS — I5023 Acute on chronic systolic (congestive) heart failure: Secondary | ICD-10-CM | POA: Diagnosis not present

## 2021-08-01 DIAGNOSIS — B179 Acute viral hepatitis, unspecified: Secondary | ICD-10-CM | POA: Diagnosis not present

## 2021-08-01 DIAGNOSIS — L8990 Pressure ulcer of unspecified site, unspecified stage: Secondary | ICD-10-CM

## 2021-08-01 DIAGNOSIS — Z515 Encounter for palliative care: Secondary | ICD-10-CM

## 2021-08-01 DIAGNOSIS — I50812 Chronic right heart failure: Secondary | ICD-10-CM | POA: Diagnosis not present

## 2021-08-01 DIAGNOSIS — I5022 Chronic systolic (congestive) heart failure: Secondary | ICD-10-CM | POA: Diagnosis not present

## 2021-08-01 HISTORY — PX: RIGHT HEART CATH: CATH118263

## 2021-08-01 HISTORY — PX: CENTRAL LINE INSERTION: CATH118232

## 2021-08-01 LAB — POCT I-STAT EG7
Acid-Base Excess: 0 mmol/L (ref 0.0–2.0)
Acid-base deficit: 1 mmol/L (ref 0.0–2.0)
Bicarbonate: 24 mmol/L (ref 20.0–28.0)
Bicarbonate: 24.6 mmol/L (ref 20.0–28.0)
Calcium, Ion: 1.07 mmol/L — ABNORMAL LOW (ref 1.15–1.40)
Calcium, Ion: 1.08 mmol/L — ABNORMAL LOW (ref 1.15–1.40)
HCT: 46 % (ref 39.0–52.0)
HCT: 48 % (ref 39.0–52.0)
Hemoglobin: 15.6 g/dL (ref 13.0–17.0)
Hemoglobin: 16.3 g/dL (ref 13.0–17.0)
O2 Saturation: 59 %
O2 Saturation: 59 %
Potassium: 4.4 mmol/L (ref 3.5–5.1)
Potassium: 4.4 mmol/L (ref 3.5–5.1)
Sodium: 119 mmol/L — CL (ref 135–145)
Sodium: 119 mmol/L — CL (ref 135–145)
TCO2: 25 mmol/L (ref 22–32)
TCO2: 26 mmol/L (ref 22–32)
pCO2, Ven: 38.6 mmHg — ABNORMAL LOW (ref 44–60)
pCO2, Ven: 41.5 mmHg — ABNORMAL LOW (ref 44–60)
pH, Ven: 7.37 (ref 7.25–7.43)
pH, Ven: 7.413 (ref 7.25–7.43)
pO2, Ven: 30 mmHg — CL (ref 32–45)
pO2, Ven: 32 mmHg (ref 32–45)

## 2021-08-01 LAB — ANTI-SMOOTH MUSCLE ANTIBODY, IGG: F-Actin IgG: 10 Units (ref 0–19)

## 2021-08-01 LAB — POCT I-STAT 7, (LYTES, BLD GAS, ICA,H+H)
Acid-Base Excess: 0 mmol/L (ref 0.0–2.0)
Bicarbonate: 23 mmol/L (ref 20.0–28.0)
Calcium, Ion: 1.04 mmol/L — ABNORMAL LOW (ref 1.15–1.40)
HCT: 48 % (ref 39.0–52.0)
Hemoglobin: 16.3 g/dL (ref 13.0–17.0)
O2 Saturation: 97 %
Potassium: 4.6 mmol/L (ref 3.5–5.1)
Sodium: 118 mmol/L — CL (ref 135–145)
TCO2: 24 mmol/L (ref 22–32)
pCO2 arterial: 33.3 mmHg (ref 32–48)
pH, Arterial: 7.446 (ref 7.35–7.45)
pO2, Arterial: 88 mmHg (ref 83–108)

## 2021-08-01 LAB — CBC
HCT: 40 % (ref 39.0–52.0)
Hemoglobin: 14.2 g/dL (ref 13.0–17.0)
MCH: 31.6 pg (ref 26.0–34.0)
MCHC: 35.5 g/dL (ref 30.0–36.0)
MCV: 88.9 fL (ref 80.0–100.0)
Platelets: 48 10*3/uL — ABNORMAL LOW (ref 150–400)
RBC: 4.5 MIL/uL (ref 4.22–5.81)
RDW: 18.1 % — ABNORMAL HIGH (ref 11.5–15.5)
WBC: 6.8 10*3/uL (ref 4.0–10.5)
nRBC: 0 % (ref 0.0–0.2)

## 2021-08-01 LAB — COMPREHENSIVE METABOLIC PANEL
ALT: 361 U/L — ABNORMAL HIGH (ref 0–44)
AST: 153 U/L — ABNORMAL HIGH (ref 15–41)
Albumin: 3 g/dL — ABNORMAL LOW (ref 3.5–5.0)
Alkaline Phosphatase: 107 U/L (ref 38–126)
Anion gap: 12 (ref 5–15)
BUN: 54 mg/dL — ABNORMAL HIGH (ref 8–23)
CO2: 19 mmol/L — ABNORMAL LOW (ref 22–32)
Calcium: 7.8 mg/dL — ABNORMAL LOW (ref 8.9–10.3)
Chloride: 85 mmol/L — ABNORMAL LOW (ref 98–111)
Creatinine, Ser: 1.31 mg/dL — ABNORMAL HIGH (ref 0.61–1.24)
GFR, Estimated: >60 mL/min (ref >60)
Glucose, Bld: 105 mg/dL — ABNORMAL HIGH (ref 70–99)
Potassium: 4.2 mmol/L (ref 3.5–5.1)
Sodium: 116 mmol/L — CL (ref 135–145)
Total Bilirubin: 24.6 mg/dL (ref 0.3–1.2)
Total Protein: 6.5 g/dL (ref 6.5–8.1)

## 2021-08-01 LAB — GLUCOSE, CAPILLARY
Glucose-Capillary: 101 mg/dL — ABNORMAL HIGH (ref 70–99)
Glucose-Capillary: 100 mg/dL — ABNORMAL HIGH (ref 70–99)
Glucose-Capillary: 123 mg/dL — ABNORMAL HIGH (ref 70–99)
Glucose-Capillary: 102 mg/dL — ABNORMAL HIGH (ref 70–99)

## 2021-08-01 LAB — SODIUM
Sodium: 118 mmol/L — CL (ref 135–145)
Sodium: 117 mmol/L — CL (ref 135–145)
Sodium: 118 mmol/L — CL (ref 135–145)
Sodium: 118 mmol/L — CL (ref 135–145)

## 2021-08-01 LAB — ALPHA-1-ANTITRYPSIN: A-1 Antitrypsin, Ser: 154 mg/dL (ref 101–187)

## 2021-08-01 LAB — CK: Total CK: 67 U/L (ref 49–397)

## 2021-08-01 LAB — PROTIME-INR
INR: 1.5 — ABNORMAL HIGH (ref 0.8–1.2)
Prothrombin Time: 17.7 seconds — ABNORMAL HIGH (ref 11.4–15.2)

## 2021-08-01 LAB — MAGNESIUM: Magnesium: 3.3 mg/dL — ABNORMAL HIGH (ref 1.7–2.4)

## 2021-08-01 LAB — CERULOPLASMIN: Ceruloplasmin: 32.3 mg/dL — ABNORMAL HIGH (ref 16.0–31.0)

## 2021-08-01 LAB — ANA: Anti Nuclear Antibody (ANA): NEGATIVE

## 2021-08-01 LAB — UREA NITROGEN, URINE: Urea Nitrogen, Ur: 1085 mg/dL

## 2021-08-01 SURGERY — RIGHT HEART CATH
Anesthesia: LOCAL

## 2021-08-01 MED ORDER — FUROSEMIDE 10 MG/ML IJ SOLN
40.0000 mg | Freq: Every day | INTRAMUSCULAR | Status: DC
  Administered 2021-08-01: 40 mg via INTRAVENOUS
  Filled 2021-08-01: qty 4

## 2021-08-01 MED ORDER — HEPARIN (PORCINE) IN NACL 1000-0.9 UT/500ML-% IV SOLN
INTRAVENOUS | Status: DC | PRN
  Administered 2021-08-01: 500 mL

## 2021-08-01 MED ORDER — ENSURE ENLIVE PO LIQD
237.0000 mL | Freq: Two times a day (BID) | ORAL | Status: DC
  Administered 2021-08-01 – 2021-08-08 (×7): 237 mL via ORAL

## 2021-08-01 MED ORDER — ADULT MULTIVITAMIN W/MINERALS CH
1.0000 | ORAL_TABLET | Freq: Every day | ORAL | Status: DC
  Administered 2021-08-01 – 2021-08-16 (×13): 1 via ORAL
  Filled 2021-08-01 (×16): qty 1

## 2021-08-01 MED ORDER — LACTULOSE 10 GM/15ML PO SOLN
20.0000 g | Freq: Two times a day (BID) | ORAL | Status: DC
Start: 2021-08-01 — End: 2021-08-16
  Administered 2021-08-01 – 2021-08-15 (×28): 20 g via ORAL
  Filled 2021-08-01 (×30): qty 30

## 2021-08-01 MED ORDER — HEPARIN (PORCINE) IN NACL 1000-0.9 UT/500ML-% IV SOLN
INTRAVENOUS | Status: AC
Start: 2021-08-01 — End: ?
  Filled 2021-08-01: qty 500

## 2021-08-01 MED ORDER — ALBUMIN HUMAN 25 % IV SOLN: 50.0000 g | Freq: Four times a day (QID) | INTRAVENOUS | Status: DC

## 2021-08-01 MED ORDER — OCTREOTIDE ACETATE 100 MCG/ML IJ SOLN
100.0000 ug | Freq: Three times a day (TID) | INTRAMUSCULAR | Status: DC
Start: 2021-08-01 — End: 2021-08-13
  Administered 2021-08-01 – 2021-08-13 (×36): 100 ug via SUBCUTANEOUS
  Filled 2021-08-01 (×41): qty 1

## 2021-08-01 MED ORDER — MIDODRINE HCL 5 MG PO TABS
10.0000 mg | ORAL_TABLET | Freq: Three times a day (TID) | ORAL | Status: DC
  Administered 2021-08-01 – 2021-08-14 (×38): 10 mg via ORAL
  Filled 2021-08-01 (×38): qty 2

## 2021-08-01 MED ORDER — LIDOCAINE HCL (PF) 1 % IJ SOLN
INTRAMUSCULAR | Status: DC | PRN
  Administered 2021-08-01: 10 mL

## 2021-08-01 MED ORDER — SODIUM CHLORIDE 0.9 % IV SOLN: INTRAVENOUS | Status: DC

## 2021-08-01 MED ORDER — LIDOCAINE HCL (PF) 1 % IJ SOLN
INTRAMUSCULAR | Status: AC
  Filled 2021-08-01: qty 30

## 2021-08-01 MED ORDER — MEXILETINE HCL 200 MG PO CAPS: 200.0000 mg | ORAL_CAPSULE | Freq: Two times a day (BID) | ORAL | Status: DC

## 2021-08-01 SURGICAL SUPPLY — 8 items
CATH SWAN GANZ 7F STRAIGHT (CATHETERS) ×1 IMPLANT
GUIDEWIRE .025 260CM (WIRE) ×1 IMPLANT
KIT CV 3L 7FR 20CM SULFAFREE (SET/KITS/TRAYS/PACK) ×1 IMPLANT
PACK CARDIAC CATHETERIZATION (CUSTOM PROCEDURE TRAY) ×3 IMPLANT
SHEATH PINNACLE 7F 10CM (SHEATH) ×2 IMPLANT
SHEATH PROBE COVER 6X72 (BAG) ×1 IMPLANT
TRANSDUCER W/STOPCOCK (MISCELLANEOUS) ×3 IMPLANT
WIRE EMERALD 3MM-J .025X260CM (WIRE) ×1 IMPLANT

## 2021-08-01 NOTE — Progress Notes (Signed)
[facility] Gastroenterology Progress Note  Erik Chan 66 y.o. [birth year]  CC: Hyperbilirubinemia, elevated LFTs   Subjective: Patient reports continued fatigue.  Denies bowel movements.  Denies nausea/vomiting.  Denies abdominal pain.  ROS : Review of Systems  Constitutional:  Positive for malaise/fatigue. Negative for chills, fever and weight loss.  Gastrointestinal:  Negative for abdominal pain, blood in stool, constipation, diarrhea, heartburn, melena, nausea and vomiting.      Objective: Vital signs in last 24 hours: Vitals:   07/31/21 2312 08/01/21 0254  BP: 98/65 98/71  Pulse: 68 70  Resp: 19 19  Temp: 97.6 F (36.4 C) (!) 97.4 F (36.3 C)  SpO2: 97% 96%    Physical Exam:  General:  Ill-appearing, jaundiced, thin  Head:  Normocephalic, without obvious abnormality, atraumatic  Eyes:  icteric sclera, EOM's intact  Lungs:   Clear to auscultation bilaterally, respirations unlabored  Heart:  Regular rate and rhythm, S1, S2 normal  Abdomen:   Soft, mildly distended bowel sounds active all four quadrants,  no masses,   Extremities: 1+ pitting edema  Pulses: 2+ and symmetric    Lab Results: Recent Labs    07/31/21 0310 07/31/21 1046 07/31/21 2329 08/01/21 0422  NA 119*   < > 118* 116*  K 4.6  --   --  4.2  CL 86*  --   --  85*  CO2 21*  --   --  19*  GLUCOSE 120*  --   --  105*  BUN 67*  --   --  54*  CREATININE 1.46*  --   --  1.31*  CALCIUM 8.5*  --   --  7.8*  MG  --   --   --  3.3*   < > = values in this interval not displayed.   Recent Labs    07/31/21 0310 08/01/21 0422  AST 184* 153*  ALT 446* 361*  ALKPHOS 103 107  BILITOT 23.9* 24.6*  PROT 6.7 6.5  ALBUMIN 3.2* 3.0*   Recent Labs    07/30/21 1722 07/31/21 0310 08/01/21 0422  WBC 6.5 7.3 6.8  NEUTROABS 5.2  --   --   HGB 15.0 15.1 14.2  HCT 41.7 41.9 40.0  MCV 89.9 90.3 88.9  PLT PLATELET CLUMPS NOTED ON SMEAR, COUNT APPEARS DECREASED 43* 48*   Recent Labs    07/30/21 1722  08/01/21 0422  LABPROT 18.7* 17.7*  INR 1.6* 1.5*      Assessment Painless jaundice -RUQ ultrasound: Ascites, fatty liver, large gallstone and gallbladder sludge -CT abdomen pelvis without contrast shows bilateral pleural effusions, small ascites, cholelithiasis, sigmoid diverticulosis -MRCP shows cholelithiasis without biliary dilation.  No choledocholithiasis.  Moderate to large bilateral pleural effusions.  Diffuse mesenteric edema with small to moderate volume ascites, diffuse body wall edema. -Hgb 14.2 -Ferritin 1075 -Iron 77 -Platelets 48 -AST 153/ALT 361/alk phos 107, trending down -T. bili 24.6, worsening -Negative hepatitis panel -Lipase 56 -BUN 54, creatinine 1.31 -INR 1.6 - MELD-Na: 32 -Ceruloplasmin 32.3 -Alpha-1 antitrypsin and ANA unrevealing   Plan: Labs still pending include Epstein-Barr, HSV, ASMA.  Continue to await serologies Follow LFTs and INR.  Continue to monitor electrolytes and mental status Could consider liver biopsy or paracentesis.  Physical exam and lab work indicated of of chronic liver disease. [facility] will follow  Garnette Scheuermann PA-C 08/01/2021, 9:42 AM  Contact #  [phone]

## 2021-08-01 NOTE — Consult Note (Addendum)
Advanced Heart Failure Team Consult Note   Primary Physician: [name], MD PCP-Cardiologist:  Nicki Guadalajara, MD  Reason for Consultation: Acute on chronic biventricular HF and shock liver  HPI:    Erik Chan is seen today d/t concern for acute on chronic biventricular CHF with low-output HF and shock liver at the request of Dr. Signe Colt with Nephrology.   66 y.o. male with tobacco abuse and COPD, and new diagnosis of systolic heart failure/NICM.   Admitted 1/23 with new acute HFrEF. Echo showed  EF < 20%. Started on milrinone. L/RHC on milrinone showed mild CAD, RA 15, PA mean 35, PCW 29 (v 35-40), Fick CO/CI 6.3/2.9. Had > 20% PVC burden with runs of NSVT. Started on IV amio and mexiletine, later switched to amio 200 bid and LifeVest placed. Diuresed with lasix gtt, weight down total of 50 lb during admission. Milrinone weaned off with Co-ox stable at 64.8%.  CT chest showed no sarcoidosis, but moderate right pleural effusion. Underwent right thoracentesis with removal 1200 cc fluid. Underwent cMRI showing LVEF 10%, basal septal wall LGE (mid-wall), apical subendocardial LGE, RV insertion nonspecific scar pattern, RV normal size with severe dysfunction (EF 19%). GDMT limited by low BP. Discharge weight 173 lbs.   Seen for follow-up in April 2023 and doing well. NYHA II. Echo day of visit EF 20-25%. Still requiring midodrine for BP. Referred to EP for consideration of ICD.  Presented to ED 07/30/21 via EMS with complaints of worsening weakness X 2 weeks and recent onset jaundice. EMS had been called on 06/15 after he had been too weak to get off the floor. He was assisted back to bed and remained almost bedbound afterwards. Labs notable for Scr 1.60, BUN 67, Na 122, AST 203, ALT 514, Total bilirubin 25.9, INR 1.6. Acetaminophen and ethanol undetectable. Ammonia level WNL. US abdomen R pleural effusion, + ascites, increased echogenicity of liver, large gallstone with gallbladder sludge. He  was admitted for further workup.   CT abdomen pelvis - bilateral pleural effusions, small ascites, anasarca, + gallstone  MR abdomen - no focal liver abnormality, cholelithiasis without biliary dilatation, moderate to large b/l pleural effusions, mesenteric edema with ascites, diffuse body wall edema  GI following. Lab workup pending.   Nephrology consulted d/t hyponatremia. Had been started on NS at 75/hr which is now off. Concern for a/c CHF with low-output - Advanced Heart Failure asked to evaluate. Given 40 mg lasix IV and started on octreotide + midodrine for soft BP.   Review of Systems: [y] = yes, [ ]  = no   General: Weight gain [ ] ; Weight loss [Y ]; Anorexia [Y]; Fatigue [ ] ; Fever [ ] ; Chills [ ] ; Weakness [ ]   Cardiac: Chest pain/pressure [ ] ; Resting SOB [ ] ; Exertional SOB [ ] ; Orthopnea [Y]; Pedal Edema [Y]; Palpitations [ ] ; Syncope [ ] ; Presyncope [ ] ; Paroxysmal nocturnal dyspnea[ ]   Pulmonary: Cough [ ] ; Wheezing[ ] ; Hemoptysis[ ] ; Sputum [ ] ; Snoring [ ]   GI: Vomiting[ ] ; Dysphagia[ ] ; Melena[ ] ; Hematochezia [ ] ; Heartburn[ ] ; Abdominal pain [ ] ; Constipation [ ] ; Diarrhea [ ] ; BRBPR [ ]   GU: Hematuria[ ] ; Dysuria [ ] ; Nocturia[ ]   Vascular: Pain in legs with walking [ ] ; Pain in feet with lying flat [ ] ; Non-healing sores [ ] ; Stroke [ ] ; TIA [ ] ; Slurred speech [ ] ;  Neuro: Headaches[ ] ; Vertigo[ ] ; Seizures[ ] ; Paresthesias[ ] ;Blurred vision [ ] ; Diplopia [ ] ; Vision changes [ ]   Ortho/Skin: Arthritis [ ] ; Joint pain [ ] ; Muscle pain [ ] ; Joint swelling [ ] ; Back Pain [ ] ; Rash [ ]   Psych: Depression[ ] ; Anxiety[ ]   Heme: Bleeding problems [ ] ; Clotting disorders [ ] ; Anemia [ ]   Endocrine: Diabetes [ ] ; Thyroid dysfunction[ ]   Home Medications Prior to Admission medications   Medication Sig Start Date End Date Taking? Authorizing Provider  acetaminophen (TYLENOL) 500 MG tablet Take 500 mg by mouth every 6 (six) hours as needed for mild pain, fever or headache.   Yes  [provider]  albuterol (VENTOLIN HFA) 108 (90 Base) MCG/ACT inhaler Inhale 1-2 puffs into the lungs every 4 (four) hours as needed for wheezing or shortness of breath. 01/28/21  Yes [provider]  amiodarone (PACERONE) 200 MG tablet Take 1 tablet (200 mg total) by mouth daily. 05/30/21  Yes Nishawn Rotan, , MD  empagliflozin (JARDIANCE) 10 MG TABS tablet Take 1 tablet (10 mg total) by mouth daily. 03/26/21  Yes , MD  mexiletine (MEXITIL) 200 MG capsule Take 1 capsule (200 mg total) by mouth every 12 (twelve) hours. 03/26/21  Yes , MD  midodrine (PROAMATINE) 5 MG tablet Take 1 tablet (5 mg total) by mouth 3 (three) times daily with meals. 04/15/21  Yes [place], , FNP  spironolactone (ALDACTONE) 25 MG tablet Take 1 tablet (25 mg total) by mouth daily. 03/26/21  Yes 01/30/21, MD    Past Medical History: Past Medical History:  Diagnosis Date   CHF (congestive heart failure) (HCC) 02/13/2021   LVEF less than 20%   COPD (chronic obstructive pulmonary disease) (HCC)    Fibromyalgia     Past Surgical History: Past Surgical History:  Procedure Laterality Date   RIGHT/LEFT HEART CATH AND CORONARY ANGIOGRAPHY N/A 02/19/2021   Procedure: RIGHT/LEFT HEART CATH AND CORONARY ANGIOGRAPHY;  Surgeon: 03/28/21, MD;  Location: [facility];  Service: Cardiovascular;  Laterality: N/A;    Family History: Family History  Problem Relation Age of Onset   Asthma Mother    Allergies Mother    Fibromyalgia Mother    Osteoarthritis Mother    Heart Problems Father    Osteoarthritis Maternal Grandmother     Social History: Social History   Socioeconomic History   Marital status: Married    Spouse name: Not on file   Number of children: Not on file   Years of education: Not on file   Highest education level: Not on file  Occupational History   Occupation: Banker   Tobacco Use   Smoking status: Former    Packs/day:  1.00    Years: 30.00    Total pack years: 30.00    Types: Pipe, Cigarettes   Smokeless tobacco: Never  Vaping Use   Vaping Use: Never used  Substance and Sexual Activity   Alcohol use: No    Alcohol/week: 0.0 standard drinks of alcohol   Drug use: No   Sexual activity: Not on file  Other Topics Concern   Not on file  Social History Narrative   Not on file   Social Determinants of Health   Financial Resource Strain: Not on file  Food Insecurity: Not on file  Transportation Needs: Not on file  Physical Activity: Not on file  Stress: Not on file  Social Connections: Not on file    Allergies:  Allergies  Allergen Reactions   Shellfish-Derived Products Hives and Rash    Objective:  Vital Signs:   Temp:  [97.3 F (36.3 C)-98 F (36.7 C)] 98 F (36.7 C) (06/22 0947) Pulse Rate:  [68-71] 70 (06/22 0947) Resp:  [17-20] 18 (06/22 0947) BP: (98-110)/(65-71) 98/66 (06/22 0947) SpO2:  [96 %-99 %] 99 % (06/22 0947) Weight:  [75.3 kg] 75.3 kg (06/22 0500) Last BM Date :  (PTA)  Weight change: Filed Weights   07/30/21 1650 08/01/21 0500  Weight: 74.4 kg 75.3 kg    Intake/Output:   Intake/Output Summary (Last 24 hours) at 08/01/2021 1203 Last data filed at 08/01/2021 0800 Gross per 24 hour  Intake 1153.07 ml  Output 400 ml  Net 753.07 ml      Physical Exam    General:  Sitting up in bed. Ill appearing. Cachectic. HEENT: normal SKIN: jaundice Neck: supple. JVP to ear . Carotids 2+ bilat; no bruits.  Cor: PMI nondisplaced. Regular rate & rhythm. No rubs, gallops or murmurs. Lungs: diminished, poor effort Abdomen: soft, distended Extremities: no cyanosis, clubbing, rash, 2 + edema Neuro: alert & orientedx3, affect flat, does not appear confused   Telemetry   Not on telemetry  EKG    SR 1st degree AVB 69 bpm  Labs   Basic Metabolic Panel: Recent Labs  Lab 07/30/21 1722 07/31/21 0310 07/31/21 1046 07/31/21 1654 07/31/21 2329 08/01/21 0422  NA  122* 119* 121* 120* 118* 116*  K 4.6 4.6  --   --   --  4.2  CL 84* 86*  --   --   --  85*  CO2 23 21*  --   --   --  19*  GLUCOSE 138* 120*  --   --   --  105*  BUN 67* 67*  --   --   --  54*  CREATININE 1.60* 1.46*  --   --   --  1.31*  CALCIUM 8.9 8.5*  --   --   --  7.8*  MG  --   --   --   --   --  3.3*    Liver Function Tests: Recent Labs  Lab 07/30/21 1722 07/31/21 0310 08/01/21 0422  AST 203* 184* 153*  ALT 514* 446* 361*  ALKPHOS 116 103 107  BILITOT 25.9* 23.9* 24.6*  PROT 7.4 6.7 6.5  ALBUMIN 3.5 3.2* 3.0*   Recent Labs  Lab 07/30/21 2150  LIPASE 56*   Recent Labs  Lab 07/30/21 1722  AMMONIA 22    CBC: Recent Labs  Lab 07/30/21 1722 07/31/21 0310 08/01/21 0422  WBC 6.5 7.3 6.8  NEUTROABS 5.2  --   --   HGB 15.0 15.1 14.2  HCT 41.7 41.9 40.0  MCV 89.9 90.3 88.9  PLT PLATELET CLUMPS NOTED ON SMEAR, COUNT APPEARS DECREASED 43* 48*    Cardiac Enzymes: No results for input(s): "CKTOTAL", "CKMB", "CKMBINDEX", "TROPONINI" in the last 168 hours.  BNP: BNP (last 3 results) Recent Labs    02/01/21 1522 02/13/21 1709  BNP 2,411.9* 1,831.1*    ProBNP (last 3 results) No results for input(s): "PROBNP" in the last 8760 hours.   CBG: Recent Labs  Lab 07/31/21 1146 07/31/21 1833 07/31/21 2051 08/01/21 0735 08/01/21 1140  GLUCAP 112* 127* 105* 101* 100*    Coagulation Studies: Recent Labs    07/30/21 1722 08/01/21 0422  LABPROT 18.7* 17.7*  INR 1.6* 1.5*     Imaging   No results found.   Medications:     Current Medications:  feeding supplement  237 mL  Oral BID BM   furosemide  40 mg Intravenous Daily   insulin aspart  0-6 Units Subcutaneous TID WC   lactulose  20 g Oral BID   midodrine  10 mg Oral TID WC   multivitamin with minerals  1 tablet Oral Daily   octreotide  100 mcg Subcutaneous TID   sodium chloride flush  3 mL Intravenous Q12H    Infusions:     Patient Profile   66 y.o. male with history of chronic  biventricular HF/NICM, PVCs/NSVT, COPD, hx pleural effusion s/p right thoracentesis 01/23, DM II. Admitted with progressive weakness in setting of elevated LFTs, hyponatremia, AKI and concern for a/c biventricular HF with low-output.  Assessment/Plan   1. Chronic Biventricular HFrEF/ NICM - Echo (1/23): severely reduced EF < 20% and severely reduced RV. Moderate to severe MR.  - L/RHC (1/23): moderate non-obstructive CAD. EF < 10% - cMRI (1/23): LVEF 10%, severe biventricular heart failure, basal septal wall mid-wall LGE, apical inferior subendocardial LGE, and RV insertion non specific scar pattern.  - CT chest (1/23): without evidence for pulmonary sarcoidosis.   - Myeloma panel and SPEP/UPEP negative. ACE level OK (37). - Etiology for CM => isolated cardiac sarcoidosis vs prior myocarditis vs genetic cardiomyopathy, +/- PVCs - Cardiac PET previously set up at [facility], has not completed. Genetic testing sent 03/04/21. - Echo 05/30/21 EF 20-25% RV severely reduced.  - Referred to EP for ICD in April - has not scheduled appointment. Missed last clinic f/u.  - Volume appears elevated. Evidence of anasarca and ascites on imaging. Given 40 mg lasix IV today per Nephrology. - Continue midodrine 10 TID - BP too soft for GDMT - Concern low-output HF may be contributing to acute liver failure and hyponatremia. Tentatively planning for RHC today to assess filling pressures and cardiac output with simultaneous measurement of hepatic venous pressure gradient. - Worry that overall prognosis is poor. Agree with Palliative Care consult  2. Acute liver failure - US abdomen R pleural effusion, + ascites, increased echogenicity of liver, large gallstone with gallbladder sludge. - CT abdomen pelvis - bilateral pleural effusions, small ascites, anasarca, + gallstone - MR abdomen - no focal liver abnormality, cholelithiasis without biliary dilatation, moderate to large b/l pleural effusions, mesenteric edema with  ascites, diffuse body wall edema - Hepatitis panel negative. Other serologies pending. - [facility] following.  - Labs today: AST 153, ALT 361, Total Bili 24.6 - Midodrine and Octreotide - RHC as above to better determine how much HF contributing to liver failure  3. Hyponatremia - Hypervolemic hyponatremia - Na 116 - Does not appear confused - Not a good candidate for Tolvaptan with liver dysfunction - Nephrology following  - Diuresis + fluid restriction  4. AKI: - Scr baseline ~ 1, this admit 1.6>1.3 - Midodrine to support BP   5. PVCs/NSVT - >20% PVC burden during admit 01/23 - Will hold off on restarting amiodarone and mexiletine for now with acute liver failure - No OSA on sleep study 2/23. - Place on telemetry monitoring    6. COPD - Former heavy smoker. Quit smoking 01/07/21.   7. H/o Pleural Effusion - s/p rt thoracentesis 1/23 w/ 1200 cc fluid removal. - moderate to large b/l pleural effusions on MR this admit - ? Thoracentesis if not improving with diuresis   8. DM2: - A1c 6.7 (1/23). - SGLT2i on hold for now   Length of Stay: 2  FINCH, LINDSAY N, PA-C  08/01/2021, 12:03 PM  Advanced  Heart Failure Team Pager [phone] (M-F; 7a - 5p)  Please contact [facility] for night-coverage after hours (4p -7a ) and weekends on [email or web address]   Patient seen and examined with the above-signed Advanced Practice Provider and/or Housestaff. I personally reviewed laboratory data, imaging studies and relevant notes. I independently examined the patient and formulated the important aspects of the plan. I have edited the note to reflect any of my changes or salient points. I have personally discussed the plan with the patient and/or family.  66 y/o male as above with severe biventricular HF due to NICM. ? PVC related.   Now admitted with fulminant liver failure, massive volume overload and severe hyponatremia. ?hepato-renal syndrome has been raised. Concern also over degree of  cardiac involvement versus primary liver dysfunction .  Very weak. Denies CP, orthopnea or PND  General:  Weak appearing. No resp difficulty + jaundiced HEENT: normal Neck: supple. Jvp to ear  Carotids 2+ bilat; no bruits. No lymphadenopathy or thryomegaly appreciated. Cor: PMI nondisplaced. Regular rate & rhythm. 2/6 TR Lungs: clear Abdomen: soft, nontender, + distended. No hepatosplenomegaly. No bruits or masses. Good bowel sounds. Extremities: no cyanosis, clubbing, rash, 2+ edema Neuro: alert & orientedx3, cranial nerves grossly intact. moves all 4 extremities w/o difficulty. Affect pleasant  Suspect he is end-stage. Degree of hyperbilrubinemia seems out of proportion to what we see in HF but agree that this may be multifactorial. I had long talk with him about his situation. He wants everything possible done. Will plan RHC today with hepatic wedge to assess cardiac involvement. Agree with HRS as strong consideration.   Will place central line at same time for ease of management.   [name], MD  5:03 PM

## 2021-08-01 NOTE — Consult Note (Cosign Needed)
Consultation Note Date: 08/01/2021   Patient Name: Erik Chan  DOB: 1955/06/23  MRN: [id number]  Age / Sex: 66 y.o., male  PCP: Kathyrn Lass, MD Referring Physician: Damita Lack, MD  Reason for Consultation: Establishing goals of care  HPI/Patient Profile: 66 y.o. male  with past medical history of biventricular failure, tobacco abuse, COPD, diabetes type 2, and nonischemic cardiomyopathy (EF 20%) admitted on 07/30/2021 with fatigue, loss of appetite, and painless jaundice.  Patient is being treated for hyponatremia, AKI, hyperbilirubin and EF, elevated LFTs (MELD score 32).  MRCP revealed cholelithiasis without biliary dilation revealed.  Right heart cath to be performed today.  Palliative medicine team was consulted to discuss goals of care given patient's overall poor prognosis.  Clinical Assessment and Goals of Care: I have reviewed medical records including EPIC notes, labs and imaging, assessed the patient and then met with patient at bedside to discuss diagnosis prognosis, GOC, EOL wishes, disposition and options.  I introduced Palliative Medicine as specialized medical care for people living with serious illness. It focuses on providing relief from the symptoms and stress of a serious illness. The goal is to improve quality of life for both the patient and the family.  We discussed a brief life review of the patient.  Patient has been married for 23 years.  He shares his wife is contrary and and has probably taken too much morphine and cholesterol medications.  He endorses he worked in the [employer].  He shares he has 2 children but they do not live locally.  Patient is oriented to person place self and current situation.  He has moments of delayed responses but was able to participate in goals of care discussions with me today.  As far as functional and nutritional status patient  endorses that he has not been able to ambulate or complete ADLs independently for approximately the last week and a half.  He endorses a significant amount of weight loss since December of last year (approximately 35 pounds).  Patient shares he has not had a large appetite for "quite some time".  We discussed patient's current illness and what it means in the larger context of patient's on-going co-morbidities.  I attempted to elicit values and goals of care important to the patient.  Patient shared some frustration with multiple doctors asking him some many questions and him having to repeat the same thing over and over again.  He says he does not see "the full picture" or "have all the answers" to be able to decide what next steps in his plan of care should be.  I outlined the patient's comorbidities include a weak heart and malnutrition.  Reviewed importance of functional, nutritional, and cognitive abilities as determinants of prognosis.  During our discussion HF PA Suzie Portela spoke with patient in regards to cardiac cath scheduled for today.  After their discussion, patient was able to verbalize to me what a cardiac cath would entail and what information we were hopeful to receive from this particular test.  When asked to his surrogate decision maker would be in the event that the patient is unable to speak for himself, patient stated that he would want his wife to be his decision maker.  Discussed CODE STATUS.  Patient would like to remain a full code at this time.  However, he endorsed that once the results of his lab work and cardiac cath are known that he would like to meet again to further discuss goals of care.  Discussed with patient/family the importance of continued conversation with family and the medical providers regarding overall plan of care and treatment options, ensuring decisions are within the context of the patient's values and GOCs.    Questions and concerns were addressed. Patient  was encouraged to call with questions or concerns.   Primary Decision Maker PATIENT  Code Status/Advance Care Planning: Full code  Prognosis:   Unable to determine  Discharge Planning: To Be Determined  Primary Diagnoses: Present on Admission:  Acute hepatitis  AKI (acute kidney injury) ([place])  Hyponatremia  Coagulopathy (HCC)  Chronic systolic CHF (congestive heart failure) (HCC)  Chronic right heart failure (HCC)  COPD (chronic obstructive pulmonary disease) (HCC)  Recurrent right pleural effusion   Physical Exam Vitals and nursing note reviewed.  HENT:     Head: Normocephalic and atraumatic.     Mouth/Throat:     Mouth: Mucous membranes are moist.  Eyes:     Pupils: Pupils are equal, round, and reactive to light.  Cardiovascular:     Pulses: Normal pulses.  Pulmonary:     Effort: Pulmonary effort is normal.  Abdominal:     Palpations: Abdomen is soft.  Musculoskeletal:     Comments: Generalized weakness  Skin:    General: Skin is dry.     Coloration: Skin is jaundiced.  Neurological:     Mental Status: He is alert and oriented to person, place, and time.  Psychiatric:        Mood and Affect: Mood normal.        Behavior: Behavior normal.        Thought Content: Thought content normal.        Judgment: Judgment normal.     Palliative Assessment/Data: 30%     I discussed this patient's plan of care with patient, RN Jeneen Rinks, Heart Failure PA Suzie Portela.  Thank you for this consult. Palliative medicine will continue to follow and assist holistically.   Time Total: 75 minutes Greater than 50%  of this time was spent counseling and coordinating care related to the above assessment and plan.  Signed by: Jordan Hawks, DNP, FNP-BC Palliative Medicine    Please contact Palliative Medicine Team phone at [phone] for questions and concerns.  For individual provider: See Shea Evans

## 2021-08-01 NOTE — Progress Notes (Signed)
  X-cover Note: Discussed case with PCCM on-call Dr. Vassie Loll. He does not recommend 3% saline with pt with EF of 20%. Will continue with NS @ 75 ml/hr.  Pt already on 1200 ml fluid restriction.  Carollee Herter, DO [facility]

## 2021-08-01 NOTE — Consult Note (Signed)
Erik Chan  HISTORY AND PHYSICAL  Erik Chan is an 66 y.o. male.    Chief Complaint: weakness  HPI: Pt is a 66M with a PMH sig for severe biventricular failure followed by Dr [name], tobacco abuse, and COPD who is now seen in consultation at the request of Dr. [name] for evaluation and recommendations surrounding hyponatremia and AKI.    Pt presented yesterday to the ED for fatigue, loss of appetite, and fatigue.  He was so weak that he wasn't able to get up from the floor on 07/25/21- had to have EMS assistance.  He has essentially remained in bed since then, drinking water but not really eating food.  Wife noticed he was jaundiced and so called EMS again to bring him to ED.  Once in ED, noted CR 1.60, Tbili 25.9, AST 203, ALT 514, Na 122, chloride 84.  Was given NS--> Na down to 116 this AM.  GI has been consulted.  In this setting we are asked to see.    Pt is not engaged in our discussion today.  He states he's been taking his meds and hasn't had any major changes but states that everything should be written in the chart.    No pain.  No f/c, n/v, SOB.  + Le edema.  No confusion.  Review of imaging shows increased liver echogenicity and gallbladder sludge with a large gallstone, patent portal vein, and ascites and anasarca along with bilateral pleural effusions.    PMH: Past Medical History:  Diagnosis Date   CHF (congestive heart failure) (HCC) 02/13/2021   LVEF less than 20%   COPD (chronic obstructive pulmonary disease) (HCC)    Fibromyalgia    PSH: Past Surgical History:  Procedure Laterality Date   RIGHT/LEFT HEART CATH AND CORONARY ANGIOGRAPHY N/A 02/19/2021   Procedure: RIGHT/LEFT HEART CATH AND CORONARY ANGIOGRAPHY;  Surgeon: Dolores Patty, MD;  Location: [facility];  Service: Cardiovascular;  Laterality: N/A;     Past Medical History:  Diagnosis Date   CHF (congestive heart failure) (HCC) 02/13/2021   LVEF less than 20%   COPD  (chronic obstructive pulmonary disease) (HCC)    Fibromyalgia     Medications:  Scheduled:  feeding supplement  237 mL Oral BID BM   insulin aspart  0-6 Units Subcutaneous TID WC   lactulose  20 g Oral BID   midodrine  10 mg Oral TID WC   multivitamin with minerals  1 tablet Oral Daily   octreotide  100 mcg Subcutaneous TID   sodium chloride flush  3 mL Intravenous Q12H    Medications Prior to Admission  Medication Sig Dispense Refill   acetaminophen (TYLENOL) 500 MG tablet Take 500 mg by mouth every 6 (six) hours as needed for mild pain, fever or headache.     albuterol (VENTOLIN HFA) 108 (90 Base) MCG/ACT inhaler Inhale 1-2 puffs into the lungs every 4 (four) hours as needed for wheezing or shortness of breath.     amiodarone (PACERONE) 200 MG tablet Take 1 tablet (200 mg total) by mouth daily. 90 tablet 3   empagliflozin (JARDIANCE) 10 MG TABS tablet Take 1 tablet (10 mg total) by mouth daily. 30 tablet 5   mexiletine (MEXITIL) 200 MG capsule Take 1 capsule (200 mg total) by mouth every 12 (twelve) hours. 60 capsule 6   midodrine (PROAMATINE) 5 MG tablet Take 1 tablet (5 mg total) by mouth 3 (three) times daily with meals. 90 tablet 4  spironolactone (ALDACTONE) 25 MG tablet Take 1 tablet (25 mg total) by mouth daily. 30 tablet 5    ALLERGIES:   Allergies  Allergen Reactions   Shellfish-Derived Products Hives and Rash    FAM HX: Family History  Problem Relation Age of Onset   Asthma Mother    Allergies Mother    Fibromyalgia Mother    Osteoarthritis Mother    Heart Problems Father    Osteoarthritis Maternal Grandmother     Social History:   reports that he has quit smoking. His smoking use included pipe and cigarettes. He has a 30.00 pack-year smoking history. He has never used smokeless tobacco. He reports that he does not drink alcohol and does not use drugs.  ROS: ROS: all other systems are reviewed and are negative except as per HPI  Blood pressure 98/66,  pulse 70, temperature 98 F (36.7 C), resp. rate 18, height 6\' 2"  (1.88 m), weight 75.3 kg, SpO2 99 %. PHYSICAL EXAM: Physical Exam GEN, lying in bed with eyes closed HEENT does not open eyes NECK+ JVD with + HJR PULM clear anteriorly CV RRR with + S3 ABD soft, distended, + fluid wave EXT 2+ anasarca NEURO AAO x 3, does not seem confused SKIN: jaundiced   Results for orders placed or performed during the hospital encounter of 07/30/21 (from the past 48 hour(s))  Protime-INR     Status: Abnormal   Collection Time: 07/30/21  5:22 PM  Result Value Ref Range   Prothrombin Time 18.7 (H) 11.4 - 15.2 seconds   INR 1.6 (H) 0.8 - 1.2    Comment: (NOTE) INR goal varies based on device and disease states. Performed at [facility], [address], [place], [place]   Ammonia     Status: None   Collection Time: 07/30/21  5:22 PM  Result Value Ref Range   Ammonia 22 9 - 35 umol/L    Comment: Performed at [facility], [address], [place], [place]  Comprehensive metabolic panel     Status: Abnormal   Collection Time: 07/30/21  5:22 PM  Result Value Ref Range   Sodium 122 (L) 135 - 145 mmol/L   Potassium 4.6 3.5 - 5.1 mmol/L   Chloride 84 (L) 98 - 111 mmol/L   CO2 23 22 - 32 mmol/L   Glucose, Bld 138 (H) 70 - 99 mg/dL    Comment: Glucose reference range applies only to samples taken after fasting for at least 8 hours.   BUN 67 (H) 8 - 23 mg/dL   Creatinine, Ser 08/01/21 (H) 0.61 - 1.24 mg/dL    Comment: ICTERUS AT THIS LEVEL MAY AFFECT RESULT   Calcium 8.9 8.9 - 10.3 mg/dL   Total Protein 7.4 6.5 - 8.1 g/dL   Albumin 3.5 3.5 - 5.0 g/dL   AST 4.01 (H) 15 - 41 U/L   ALT 514 (H) 0 - 44 U/L   Alkaline Phosphatase 116 38 - 126 U/L   Total Bilirubin 25.9 (HH) 0.3 - 1.2 mg/dL    Comment: CRITICAL RESULT CALLED TO, READ BACK BY AND VERIFIED WITH: A.ELLWANGER,RN @1819  07/30/2021 VANG.J    GFR, Estimated 47 (L) >60 mL/min    Comment: (NOTE) Calculated using  the CKD-EPI Creatinine Equation (2021)    Anion gap 15 5 - 15    Comment: Performed at [facility], [address], [place], [address] [place]  CBC with Differential     Status: Abnormal  Collection Time: 07/30/21  5:22 PM  Result Value Ref Range   WBC 6.5 4.0 - 10.5 K/uL   RBC 4.64 4.22 - 5.81 MIL/uL   Hemoglobin 15.0 13.0 - 17.0 g/dL   HCT 38.1 82.9 - 93.7 %   MCV 89.9 80.0 - 100.0 fL   MCH 32.3 26.0 - 34.0 pg   MCHC 36.0 30.0 - 36.0 g/dL   RDW 16.9 (H) 67.8 - 93.8 %   Platelets  150 - 400 K/uL    PLATELET CLUMPS NOTED ON SMEAR, COUNT APPEARS DECREASED    Comment: Immature Platelet Fraction may be clinically indicated, consider ordering this additional test [id number]    nRBC 0.0 0.0 - 0.2 %   Neutrophils Relative % 80 %   Neutro Abs 5.2 1.7 - 7.7 K/uL   Lymphocytes Relative 7 %   Lymphs Abs 0.4 (L) 0.7 - 4.0 K/uL   Monocytes Relative 12 %   Monocytes Absolute 0.8 0.1 - 1.0 K/uL   Eosinophils Relative 0 %   Eosinophils Absolute 0.0 0.0 - 0.5 K/uL   Basophils Relative 0 %   Basophils Absolute 0.0 0.0 - 0.1 K/uL   Immature Granulocytes 1 %   Abs Immature Granulocytes 0.05 0.00 - 0.07 K/uL    Comment: Performed at [facility], [address], [place], [place] [address]  Ethanol     Status: None   Collection Time: 07/30/21  5:25 PM  Result Value Ref Range   Alcohol, Ethyl (B) <10 <10 mg/dL    Comment: (NOTE) Lowest detectable limit for serum alcohol is 10 mg/dL.  For medical purposes only. Performed at [facility], [address] [place], [place] [address]   Acetaminophen level     Status: Abnormal   Collection Time: 07/30/21  5:25 PM  Result Value Ref Range   Acetaminophen (Tylenol), Serum <10 (L) 10 - 30 ug/mL    Comment: (NOTE) Therapeutic concentrations vary significantly. A range of 10-30 ug/mL  may be an effective concentration for many patients. However, some  are best treated at concentrations outside of this range. Acetaminophen  concentrations >150 ug/mL at 4 hours after ingestion  and >50 ug/mL at 12 hours after ingestion are often associated with  toxic reactions.  Performed at [facility], [address] [place], [place] [address]   Urinalysis, Routine w reflex microscopic     Status: Abnormal   Collection Time: 07/30/21  9:19 PM  Result Value Ref Range   Color, Urine AMBER (A) YELLOW    Comment: BIOCHEMICALS MAY BE AFFECTED BY COLOR   APPearance HAZY (A) CLEAR   Specific Gravity, Urine 1.020 1.005 - 1.030   pH 5.5 5.0 - 8.0   Glucose, UA >=500 (A) NEGATIVE mg/dL   Hgb urine dipstick NEGATIVE NEGATIVE   Bilirubin Urine LARGE (A) NEGATIVE   Ketones, ur NEGATIVE NEGATIVE mg/dL   Protein, ur 30 (A) NEGATIVE mg/dL   Nitrite NEGATIVE NEGATIVE   Leukocytes,Ua NEGATIVE NEGATIVE    Comment: Performed at [facility], [address], [place], [place] [address]  Sodium, urine, random     Status: None   Collection Time: 07/30/21  9:19 PM  Result Value Ref Range   Sodium, Ur <10 mmol/L    Comment: Performed at [facility], [address], [place], [place] [address]  Osmolality, urine     Status: None   Collection Time: 07/30/21  9:19 PM  Result Value Ref Range  Osmolality, Ur 617 300 - 900 mOsm/kg    Comment: Performed at [facility], [address], [place], [place] [address]  Creatinine, urine, random     Status: None   Collection Time: 07/30/21  9:19 PM  Result Value Ref Range   Creatinine, Urine 94.69 mg/dL    Comment: Performed at [facility], [address] [place], [place] [address]  Urinalysis, Microscopic (reflex)     Status: Abnormal   Collection Time: 07/30/21  9:19 PM  Result Value Ref Range   RBC / HPF 0-5 0 - 5 RBC/hpf   WBC, UA 0-5 0 - 5 WBC/hpf   Bacteria, UA FEW (A) NONE SEEN   Squamous Epithelial / LPF 0-5 0 - 5    Comment: Performed at [facility], [address], [place], [place] [address]  Lipase, blood     Status: Abnormal   Collection  Time: 07/30/21  9:50 PM  Result Value Ref Range   Lipase 56 (H) 11 - 51 U/L    Comment: Performed at [facility], [address], [place], [place] [address]  Hepatitis panel, acute     Status: None   Collection Time: 07/30/21  9:50 PM  Result Value Ref Range   Hepatitis B Surface Ag NON REACTIVE NON REACTIVE   HCV Ab NON REACTIVE NON REACTIVE    Comment: (NOTE) Nonreactive HCV antibody screen is consistent with no HCV infections,  unless recent infection is suspected or other evidence exists to indicate HCV infection.     Hep A IgM NON REACTIVE NON REACTIVE   Hep B C IgM NON REACTIVE NON REACTIVE    Comment: Performed at [facility], [address], [place], [place] [address]  Lactate dehydrogenase     Status: Abnormal   Collection Time: 07/30/21  9:50 PM  Result Value Ref Range   LDH 319 (H) 98 - 192 U/L    Comment: Performed at [facility], [address] [place], [place] [address]  Osmolality     Status: None   Collection Time: 07/30/21  9:50 PM  Result Value Ref Range   Osmolality 279 275 - 295 mOsm/kg    Comment: Performed at [facility], [address], [place], [place] [address]  CBG monitoring, ED     Status: Abnormal   Collection Time: 07/30/21 10:47 PM  Result Value Ref Range   Glucose-Capillary 147 (H) 70 - 99 mg/dL    Comment: Glucose reference range applies only to samples taken after fasting for at least 8 hours.  Comprehensive metabolic panel     Status: Abnormal   Collection Time: 07/31/21  3:10 AM  Result Value Ref Range   Sodium 119 (LL) 135 - 145 mmol/L    Comment: CRITICAL RESULT CALLED TO, READ BACK BY AND VERIFIED WITH: L TOLER,RN 07/31/2021 0357 WILDERK    Potassium 4.6 3.5 - 5.1 mmol/L   Chloride 86 (L) 98 - 111 mmol/L   CO2 21 (L) 22 - 32 mmol/L   Glucose, Bld 120 (H) 70 - 99 mg/dL    Comment: Glucose reference range applies only to samples taken after fasting for at least 8 hours.   BUN 67 (H) 8 - 23 mg/dL    Creatinine, Ser 5.27 (H) 0.61 - 1.24 mg/dL   Calcium 8.5 (L) 8.9 - 10.3 mg/dL   Total Protein 6.7 6.5 - 8.1 g/dL   Albumin 3.2 (L) 3.5 -  5.0 g/dL   AST 267 (H) 15 - 41 U/L   ALT 446 (H) 0 - 44 U/L   Alkaline Phosphatase 103 38 - 126 U/L   Total Bilirubin 23.9 (HH) 0.3 - 1.2 mg/dL    Comment: CRITICAL RESULT CALLED TO, READ BACK BY AND VERIFIED WITH: L TOLER,RN 07/31/2021 0357 WILDERK    GFR, Estimated 53 (L) >60 mL/min    Comment: (NOTE) Calculated using the CKD-EPI Creatinine Equation (2021)    Anion gap 12 5 - 15    Comment: Performed at [facility], [address] [place], [place] [address]  CBC     Status: Abnormal   Collection Time: 07/31/21  3:10 AM  Result Value Ref Range   WBC 7.3 4.0 - 10.5 K/uL   RBC 4.64 4.22 - 5.81 MIL/uL   Hemoglobin 15.1 13.0 - 17.0 g/dL   HCT 09.9 83.3 - 82.5 %   MCV 90.3 80.0 - 100.0 fL   MCH 32.5 26.0 - 34.0 pg   MCHC 36.0 30.0 - 36.0 g/dL   RDW 05.3 (H) 97.6 - 73.4 %   Platelets 43 (L) 150 - 400 K/uL    Comment: Immature Platelet Fraction may be clinically indicated, consider ordering this additional test [id number] REPEATED TO VERIFY PLATELET COUNT CONFIRMED BY SMEAR RESULT CALLED TO, READ BACK BY AND VERIFIED WITH: LINDA TOLLER, RN ON 07/31/21 AT 0400    nRBC 0.0 0.0 - 0.2 %    Comment: Performed at [facility], [address], [place], [place] [address]  CBG monitoring, ED     Status: Abnormal   Collection Time: 07/31/21  7:37 AM  Result Value Ref Range   Glucose-Capillary 127 (H) 70 - 99 mg/dL    Comment: Glucose reference range applies only to samples taken after fasting for at least 8 hours.  Sodium     Status: Abnormal   Collection Time: 07/31/21 10:46 AM  Result Value Ref Range   Sodium 121 (L) 135 - 145 mmol/L    Comment: Performed at [facility], [address] [place], [place] [address]  ANA     Status: None   Collection Time: 07/31/21 10:46 AM  Result Value Ref Range   Anti Nuclear Antibody (ANA)  Negative Negative    Comment: (NOTE) Performed At: [facility] [address] [place], [place] [id number] Jolene Schimke MD QI:[id number]   Alpha-1-antitrypsin     Status: None   Collection Time: 07/31/21 10:46 AM  Result Value Ref Range   A-1 Antitrypsin, Ser 154 101 - 187 mg/dL    Comment: (NOTE) Performed At: [facility] [address] [place], [place] [id number] Jolene Schimke MD YJ:[id number]   Ceruloplasmin     Status: Abnormal   Collection Time: 07/31/21 10:46 AM  Result Value Ref Range   Ceruloplasmin 32.3 (H) 16.0 - 31.0 mg/dL    Comment: (NOTE) Performed At: [facility] [address] [place], [place] [id number] Jolene Schimke MD YD:[id number]   Iron and TIBC     Status: None   Collection Time: 07/31/21 10:46 AM  Result Value Ref Range   Iron 77 45 - 182 ug/dL   TIBC 720 947 - 096 ug/dL   Saturation Ratios 25 17.9 - 39.5 %   UIBC 225 ug/dL    Comment: Performed at [facility], [address] [place], [place] [address]  Ferritin     Status: Abnormal   Collection Time: 07/31/21 10:46 AM  Result Value Ref Range   Ferritin 1,075 (H) 24 - 336 ng/mL    Comment: Performed at [facility], [address] [place], [place] [address]  CBG monitoring, ED     Status: Abnormal   Collection Time: 07/31/21 11:46 AM  Result Value Ref Range   Glucose-Capillary 112 (H) 70 - 99 mg/dL    Comment: Glucose reference range applies only to samples taken after fasting for at least 8 hours.   Comment 1 Notify RN    Comment 2 Document in Chart   Sodium     Status: Abnormal   Collection Time: 07/31/21  4:54 PM  Result Value Ref Range   Sodium 120 (L) 135 - 145 mmol/L    Comment: Performed at [facility], [address], [place], [place] [address]  Glucose, capillary     Status: Abnormal   Collection Time: 07/31/21  6:33 PM  Result Value Ref Range   Glucose-Capillary 127 (H) 70 - 99 mg/dL    Comment: Glucose reference range applies  only to samples taken after fasting for at least 8 hours.  Glucose, capillary     Status: Abnormal   Collection Time: 07/31/21  8:51 PM  Result Value Ref Range   Glucose-Capillary 105 (H) 70 - 99 mg/dL    Comment: Glucose reference range applies only to samples taken after fasting for at least 8 hours.  Sodium     Status: Abnormal   Collection Time: 07/31/21 11:29 PM  Result Value Ref Range   Sodium 118 (LL) 135 - 145 mmol/L    Comment: CRITICAL RESULT CALLED TO, READ BACK BY AND VERIFIED WITH: K.[place], RN [phone] 06.22.23 M.RIVET Performed at [facility], [address], [place], [place] [address]   CBC     Status: Abnormal   Collection Time: 08/01/21  4:22 AM  Result Value Ref Range   WBC 6.8 4.0 - 10.5 K/uL   RBC 4.50 4.22 - 5.81 MIL/uL   Hemoglobin 14.2 13.0 - 17.0 g/dL   HCT 86.5 78.4 - 69.6 %   MCV 88.9 80.0 - 100.0 fL   MCH 31.6 26.0 - 34.0 pg   MCHC 35.5 30.0 - 36.0 g/dL   RDW 29.5 (H) 28.4 - 13.2 %   Platelets 48 (L) 150 - 400 K/uL    Comment: Immature Platelet Fraction may be clinically indicated, consider ordering this additional test [id number] CONSISTENT WITH PREVIOUS RESULT REPEATED TO VERIFY    nRBC 0.0 0.0 - 0.2 %    Comment: Performed at [facility], [address], [place], [place] [address]  Protime-INR     Status: Abnormal   Collection Time: 08/01/21  4:22 AM  Result Value Ref Range   Prothrombin Time 17.7 (H) 11.4 - 15.2 seconds   INR 1.5 (H) 0.8 - 1.2    Comment: (NOTE) INR goal varies based on device and disease states. Performed at [facility], [address] [place], [place] [address]   Magnesium     Status: Abnormal   Collection Time: 08/01/21  4:22 AM  Result Value Ref Range   Magnesium 3.3 (H) 1.7 - 2.4 mg/dL    Comment: Performed at [facility], [address] [place], [place] [address]  Comprehensive metabolic panel     Status: Abnormal   Collection Time: 08/01/21  4:22 AM  Result Value Ref Range   Sodium  116 (LL) 135 - 145 mmol/L  Comment: CRITICAL RESULT CALLED TO, READ BACK BY AND VERIFIED WITH: H.PENG, RN 1914 06.22.23 M.RIVET    Potassium 4.2 3.5 - 5.1 mmol/L   Chloride 85 (L) 98 - 111 mmol/L   CO2 19 (L) 22 - 32 mmol/L   Glucose, Bld 105 (H) 70 - 99 mg/dL    Comment: Glucose reference range applies only to samples taken after fasting for at least 8 hours.   BUN 54 (H) 8 - 23 mg/dL   Creatinine, Ser 7.82 (H) 0.61 - 1.24 mg/dL   Calcium 7.8 (L) 8.9 - 10.3 mg/dL   Total Protein 6.5 6.5 - 8.1 g/dL   Albumin 3.0 (L) 3.5 - 5.0 g/dL   AST 956 (H) 15 - 41 U/L   ALT 361 (H) 0 - 44 U/L   Alkaline Phosphatase 107 38 - 126 U/L   Total Bilirubin 24.6 (HH) 0.3 - 1.2 mg/dL    Comment: CRITICAL RESULT CALLED TO, READ BACK BY AND VERIFIED WITH: H.PENG, RN 0518 06.22.23 M.RIVET    GFR, Estimated >60 >60 mL/min    Comment: (NOTE) Calculated using the CKD-EPI Creatinine Equation (2021)    Anion gap 12 5 - 15    Comment: Performed at [facility], [address] [place], [place] [address]  Glucose, capillary     Status: Abnormal   Collection Time: 08/01/21  7:35 AM  Result Value Ref Range   Glucose-Capillary 101 (H) 70 - 99 mg/dL    Comment: Glucose reference range applies only to samples taken after fasting for at least 8 hours.    MR ABDOMEN MRCP W WO CONTAST  Result Date: 07/31/2021 CLINICAL DATA:  Cholelithiasis on CT imaging. EXAM: MRI ABDOMEN WITHOUT AND WITH CONTRAST (INCLUDING MRCP) TECHNIQUE: Multiplanar multisequence MR imaging of the abdomen was performed both before and after the administration of intravenous contrast. Heavily T2-weighted images of the biliary and pancreatic ducts were obtained, and three-dimensional MRCP images were rendered by post processing. CONTRAST:  7.90mL GADAVIST GADOBUTROL 1 MMOL/ML IV SOLN COMPARISON:  CT and ultrasound exams from 07/30/2021 FINDINGS: Lower chest: Heart is enlarged. Moderate to large bilateral pleural effusions evident.  Hepatobiliary: No suspicious focal abnormality within the liver parenchyma. Small focus of differential perfusion identified in the subcapsular liver along the gallbladder fundus, likely benign transient hepatic intensity difference. 1.6 x 1.2 cm gallstone evident. No substantial gallbladder wall thickening or pericholecystic fluid although there is free fluid around the liver. No intrahepatic or extrahepatic biliary dilation. Pancreas: No focal mass lesion. No dilatation of the main duct. No intraparenchymal cyst. No peripancreatic edema. Spleen:  No splenomegaly. No focal mass lesion. Adrenals/Urinary Tract: No adrenal nodule or mass. Right kidney unremarkable. 5 cm simple cyst noted upper pole left kidney (Bosniak) No followup recommended. Stomach/Bowel: Stomach is unremarkable. No gastric wall thickening. No evidence of outlet obstruction. Duodenum is normally positioned as is the ligament of Treitz. No small bowel or colonic dilatation within the visualized abdomen. Vascular/Lymphatic: No abdominal aortic aneurysm. No abdominal lymphadenopathy. Other: Diffuse mesenteric edema is associated with small to moderate volume ascites. Musculoskeletal: Diffuse body wall edema evident. No focal suspicious marrow enhancement within the visualized bony anatomy. IMPRESSION: 1. Cholelithiasis without intrahepatic or extrahepatic biliary dilation. No findings to suggest choledocholithiasis. 2. Moderate to large bilateral pleural effusions. 3. Diffuse mesenteric edema with small to moderate volume ascites. 4. Diffuse body wall edema. Electronically Signed   By: Kennith Center M.D.   On: 07/31/2021 10:03   MR 3D Recon At Scanner  Result Date: 07/31/2021 CLINICAL DATA:  Cholelithiasis on CT imaging. EXAM: MRI ABDOMEN WITHOUT AND WITH CONTRAST (INCLUDING MRCP) TECHNIQUE: Multiplanar multisequence MR imaging of the abdomen was performed both before and after the administration of intravenous contrast. Heavily T2-weighted  images of the biliary and pancreatic ducts were obtained, and three-dimensional MRCP images were rendered by post processing. CONTRAST:  7.39mL GADAVIST GADOBUTROL 1 MMOL/ML IV SOLN COMPARISON:  CT and ultrasound exams from 07/30/2021 FINDINGS: Lower chest: Heart is enlarged. Moderate to large bilateral pleural effusions evident. Hepatobiliary: No suspicious focal abnormality within the liver parenchyma. Small focus of differential perfusion identified in the subcapsular liver along the gallbladder fundus, likely benign transient hepatic intensity difference. 1.6 x 1.2 cm gallstone evident. No substantial gallbladder wall thickening or pericholecystic fluid although there is free fluid around the liver. No intrahepatic or extrahepatic biliary dilation. Pancreas: No focal mass lesion. No dilatation of the main duct. No intraparenchymal cyst. No peripancreatic edema. Spleen:  No splenomegaly. No focal mass lesion. Adrenals/Urinary Tract: No adrenal nodule or mass. Right kidney unremarkable. 5 cm simple cyst noted upper pole left kidney (Bosniak) No followup recommended. Stomach/Bowel: Stomach is unremarkable. No gastric wall thickening. No evidence of outlet obstruction. Duodenum is normally positioned as is the ligament of Treitz. No small bowel or colonic dilatation within the visualized abdomen. Vascular/Lymphatic: No abdominal aortic aneurysm. No abdominal lymphadenopathy. Other: Diffuse mesenteric edema is associated with small to moderate volume ascites. Musculoskeletal: Diffuse body wall edema evident. No focal suspicious marrow enhancement within the visualized bony anatomy. IMPRESSION: 1. Cholelithiasis without intrahepatic or extrahepatic biliary dilation. No findings to suggest choledocholithiasis. 2. Moderate to large bilateral pleural effusions. 3. Diffuse mesenteric edema with small to moderate volume ascites. 4. Diffuse body wall edema. Electronically Signed   By: Kennith Center M.D.   On: 07/31/2021  10:03   CT ABDOMEN PELVIS WO CONTRAST  Result Date: 07/30/2021 CLINICAL DATA:  Ascites and jaundice. EXAM: CT ABDOMEN AND PELVIS WITHOUT CONTRAST TECHNIQUE: Multidetector CT imaging of the abdomen and pelvis was performed following the standard protocol without IV contrast. RADIATION DOSE REDUCTION: This exam was performed according to the departmental dose-optimization program which includes automated exposure control, adjustment of the mA and/or kV according to patient size and/or use of iterative reconstruction technique. COMPARISON:  Abdominal ultrasound dated 07/30/2021. FINDINGS: Evaluation of this exam is limited in the absence of intravenous contrast. Lower chest: Partially visualized moderate right and small left pleural effusions with partial compressive atelectasis of the lung bases versus pneumonia. There is mild cardiomegaly with mild biatrial dilatation. No intra-abdominal free air. Diffuse mesenteric edema and small ascites. Hepatobiliary: The liver is unremarkable. No biliary ductal dilatation. There is a gallstone. High attenuating content within the gallbladder most consistent with vicarious excretion of recently administered contrast. Pancreas: The pancreas is unremarkable as visualized. Spleen: Normal in size without focal abnormality. Adrenals/Urinary Tract: The adrenal glands are unremarkable. There is no hydronephrosis or nephrolithiasis on either side. There is a 4.5 cm left renal upper pole cyst. The visualized ureters and urinary bladder appear unremarkable. Stomach/Bowel: There is sigmoid diverticulosis. There is no bowel obstruction. The appendix is normal. Vascular/Lymphatic: Mild aortoiliac atherosclerotic disease. The IVC is unremarkable. No portal venous gas. There is no adenopathy. Reproductive: The prostate and seminal vesicles are grossly unremarkable. No pelvic mass. Other: Diffuse mesenteric and subcutaneous edema and anasarca. Musculoskeletal: No acute or significant osseous  findings. IMPRESSION: 1. Bilateral pleural effusions, small ascites, and anasarca. 2. Cholelithiasis. 3. Sigmoid diverticulosis. No bowel obstruction.  Normal appendix. 4. Aortic Atherosclerosis (ICD10-I70.0). Electronically Signed   By: Elgie Collard M.D.   On: 07/30/2021 22:16   US Abdomen Limited RUQ (LIVER/GB)  Result Date: 07/30/2021 CLINICAL DATA:  Jaundice EXAM: ULTRASOUND ABDOMEN LIMITED RIGHT UPPER QUADRANT COMPARISON:  Abdominal ultrasound 01/30/2021 FINDINGS: Gallbladder: 2.2 cm echogenic shadowing calculus. Low-level echogenic sludge. No wall thickening or sonographic Murphy's sign. Common bile duct: Diameter: 4 mm Liver: Increased echogenicity of the parenchyma with no focal mass identified. Portal vein is patent on color Doppler imaging with normal direction of blood flow towards the liver. Other: Right pleural effusion.  Ascites. IMPRESSION: 1. Right pleural effusion. 2. Ascites. 3. Increased echogenicity of the liver parenchyma which may represent hepatic steatosis and/or other hepatocellular disease. 4. Large gallstone.  Gallbladder sludge. Electronically Signed   By: Jannifer Hick M.D.   On: 07/30/2021 19:11    Assessment/Plan  Hyponatremia:  Appears to be hypervolemic hyponatremia based on physical exam and urine electrolytes.  Does not have symptoms- don't think 3% necessary at present, also not recommended by PCCM overnight last night.  - fluid restrict to 1.2L daily  - needs to get BP up- will add midodrine now (and octreotide in case developing HRS 1)  ? If this unifying diagnosis is delayed presentation of CHF exacerbation with low flow state and shock liver with MSOF--> will ask Dr [name] to take a look  - hesitant to give any more volume in the form of albumin  - will need diuresis- tolvaptan not a good choice with liver issues, BP soft now, may need pressors/ inotropes to augment diuresis, start with 40 IV Lasix  2.  Acute decompensated liver failure: no history of  this previously  - GI following  - shock liver vs autoimmune vs other  - possibly for para and/or biopsy  - added midodrine and octreotide   3.  Acute on chronic biventricular CHF: cardiac MRI 02/2021 showed EF < 20% and severely reduced RV dysfunction  - volume up now  - suspect low flow- output state  - appreciate heart failure  4.  AKI: Cr was previously WNL  - suspect cardiorenal/ hepatorenal combination thereof  - octreotide and midodrine  5.  Dispo: inpatient.  Prognosis looks poor.  I think palliative care c/s is an appropriate choice    Melah Ebling 08/01/2021, 11:06 AM

## 2021-08-01 NOTE — Progress Notes (Signed)
Date and time results received: 08/01/21 0040   Test: sodium  Critical Value: 118  Name of Provider Notified: Pierre Bali DO   Orders Received? Or Actions Taken?:  None so far

## 2021-08-01 NOTE — Progress Notes (Signed)
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  Apparently patient felt very weak on 6/15 and laid on the floor and was unable to get back in her bed.  EMS was called to help with this.  Due to progressive weakness patient was brought to the hospital.  Upon admission patient was noted to be hyponatremic, elevated creatinine, transaminitis.  Ammonia level was normal.  Right upper quadrant ultrasound showed pleural effusion, ascites.  GI was consulted. Due to hyponatremia, Nephro was also consulted.    Assessment & Plan:  Principal Problem:   Acute hepatitis Active Problems:   Chronic systolic CHF (congestive heart failure) (HCC)   AKI (acute kidney injury) (HCC)   Hyponatremia   Coagulopathy (HCC)   Chronic right heart failure (HCC)   COPD (chronic obstructive pulmonary disease) (HCC)   Non-insulin dependent type 2 diabetes mellitus (HCC)   Recurrent right pleural effusion   Pressure injury of skin       Acute hepatitis with elevated total bilirubin -Suspect cardiac in nature. - CT abdomen pelvis-bilateral pleural effusion, hepatic steatosis, gallstones - Viral panel-negative - [facility] following. Autoimmune panel ordered. Would benefit from Liver Biopsy. Will Have GI determine the timing of it.  - Right upper quadrant ultrasound showed hepatic steatosis, large gallstone - MRCP is neg for obs stones, showed effusion, ascites, bowel wall edema.   Hyponatremia -Urine Na <10; With Gentle hydration alone Na at first improved then trended down. Some signs suggestive of Intravascular vol depletion but with has hx of sCHF as well. Also has some Pic of SIADH, therefore today will hold fluids. Place him on fluid restriction 1200cc. Will defer need of Samsca to Nephro. Spoke  with Dr Hollie Salk who will see the ptn.    Acute kidney injury; Improving -Admission creatinine 1.6>1.3.  Baseline 1.0.  Suspect from dehydration, prerenal.  Give IV fluids.   Chronic systolic CHF; chronic RV failure  - NICM with LV EF 20-25% and severely reduced RV systolic function on TTE in April 2023.  Fluids with Caution. CHF team consulted.    COPD -Not in exacerbation.  As needed bronchodilators.   Recurrent right-sided pleural effusion -Closely monitor this   Diabetes mellitus type 2 -Hemoglobin A1c 6.7 in January 2023.  On sliding scale and Accu-Cheks.  Given advanced comorbidities, palliative care team consulted to help.-Goals of care. Poor prognosis.  PT= SNF  DVT prophylaxis: SCDs Start: 07/30/21 2025 Code Status: Full Code Family Communication:    Status is: Inpatient Remains inpatient appropriate because: Decompensated cirrhosis, maintain hosp stay until cleard by GI. Also has worsening hyponatremia.    Subjective: No complaints, overall feels weak  Examination: Constitutional: Not in acute distress. Chronically ill Respiratory: bibasilar rhonchi Cardiovascular: Normal sinus rhythm, no rubs Abdomen: Nontender nondistended good bowel sounds Musculoskeletal: No edema noted Skin: Jaundiced.  Neurologic: CN 2-12 grossly intact.  And nonfocal Psychiatric: Normal judgment and insight. Alert and oriented x 3. Normal mood.     Objective: Vitals:   07/31/21 1848 07/31/21 2312 08/01/21 0254 08/01/21 0500  BP: 103/69 98/65 98/71    Pulse: 68 68 70   Resp: 17 19 19    Temp: (!) 97.4 F (36.3 C) 97.6 F (36.4 C) (!) 97.4 F (36.3 C)   TempSrc: Oral  Oral   SpO2: 96%  97% 96%   Weight:    75.3 kg  Height:        Intake/Output Summary (Last 24 hours) at 08/01/2021 0740 Last data filed at 08/01/2021 0553 Gross per 24 hour  Intake 913.07 ml  Output 400 ml  Net 513.07 ml   Filed Weights   07/30/21 1650 08/01/21 0500  Weight: 74.4 kg 75.3 kg     Data  Reviewed:   CBC: Recent Labs  Lab 07/30/21 1722 07/31/21 0310 08/01/21 0422  WBC 6.5 7.3 6.8  NEUTROABS 5.2  --   --   HGB 15.0 15.1 14.2  HCT 41.7 41.9 40.0  MCV 89.9 90.3 88.9  PLT PLATELET CLUMPS NOTED ON SMEAR, COUNT APPEARS DECREASED 43* 48*   Basic Metabolic Panel: Recent Labs  Lab 07/30/21 1722 07/31/21 0310 07/31/21 1046 07/31/21 1654 07/31/21 2329 08/01/21 0422  NA 122* 119* 121* 120* 118* 116*  K 4.6 4.6  --   --   --  4.2  CL 84* 86*  --   --   --  85*  CO2 23 21*  --   --   --  19*  GLUCOSE 138* 120*  --   --   --  105*  BUN 67* 67*  --   --   --  54*  CREATININE 1.60* 1.46*  --   --   --  1.31*  CALCIUM 8.9 8.5*  --   --   --  7.8*  MG  --   --   --   --   --  3.3*   GFR: Estimated Creatinine Clearance: 59.1 mL/min (A) (by C-G formula based on SCr of 1.31 mg/dL (H)). Liver Function Tests: Recent Labs  Lab 07/30/21 1722 07/31/21 0310 08/01/21 0422  AST 203* 184* 153*  ALT 514* 446* 361*  ALKPHOS 116 103 107  BILITOT 25.9* 23.9* 24.6*  PROT 7.4 6.7 6.5  ALBUMIN 3.5 3.2* 3.0*   Recent Labs  Lab 07/30/21 2150  LIPASE 56*   Recent Labs  Lab 07/30/21 1722  AMMONIA 22   Coagulation Profile: Recent Labs  Lab 07/30/21 1722 08/01/21 0422  INR 1.6* 1.5*   Cardiac Enzymes: No results for input(s): "CKTOTAL", "CKMB", "CKMBINDEX", "TROPONINI" in the last 168 hours. BNP (last 3 results) No results for input(s): "PROBNP" in the last 8760 hours. HbA1C: No results for input(s): "HGBA1C" in the last 72 hours. CBG: Recent Labs  Lab 07/31/21 0737 07/31/21 1146 07/31/21 1833 07/31/21 2051 08/01/21 0735  GLUCAP 127* 112* 127* 105* 101*   Lipid Profile: No results for input(s): "CHOL", "HDL", "LDLCALC", "TRIG", "CHOLHDL", "LDLDIRECT" in the last 72 hours. Thyroid Function Tests: No results for input(s): "TSH", "T4TOTAL", "FREET4", "T3FREE", "THYROIDAB" in the last 72 hours. Anemia Panel: Recent Labs    07/31/21 1046  FERRITIN 1,075*   TIBC 302  IRON 77   Sepsis Labs: No results for input(s): "PROCALCITON", "LATICACIDVEN" in the last 168 hours.  No results found for this or any previous visit (from the past 240 hour(s)).       Radiology Studies: MR ABDOMEN MRCP W WO CONTAST  Result Date: 07/31/2021 CLINICAL DATA:  Cholelithiasis on CT imaging. EXAM: MRI ABDOMEN WITHOUT AND WITH CONTRAST (INCLUDING MRCP) TECHNIQUE: Multiplanar multisequence MR imaging of the abdomen was performed both before and after the administration of intravenous contrast. Heavily T2-weighted images of the biliary and pancreatic ducts were obtained, and three-dimensional MRCP images were rendered by post processing. CONTRAST:  7.56mL GADAVIST GADOBUTROL 1 MMOL/ML  IV SOLN COMPARISON:  CT and ultrasound exams from 07/30/2021 FINDINGS: Lower chest: Heart is enlarged. Moderate to large bilateral pleural effusions evident. Hepatobiliary: No suspicious focal abnormality within the liver parenchyma. Small focus of differential perfusion identified in the subcapsular liver along the gallbladder fundus, likely benign transient hepatic intensity difference. 1.6 x 1.2 cm gallstone evident. No substantial gallbladder wall thickening or pericholecystic fluid although there is free fluid around the liver. No intrahepatic or extrahepatic biliary dilation. Pancreas: No focal mass lesion. No dilatation of the main duct. No intraparenchymal cyst. No peripancreatic edema. Spleen:  No splenomegaly. No focal mass lesion. Adrenals/Urinary Tract: No adrenal nodule or mass. Right kidney unremarkable. 5 cm simple cyst noted upper pole left kidney (Bosniak) No followup recommended. Stomach/Bowel: Stomach is unremarkable. No gastric wall thickening. No evidence of outlet obstruction. Duodenum is normally positioned as is the ligament of Treitz. No small bowel or colonic dilatation within the visualized abdomen. Vascular/Lymphatic: No abdominal aortic aneurysm. No abdominal  lymphadenopathy. Other: Diffuse mesenteric edema is associated with small to moderate volume ascites. Musculoskeletal: Diffuse body wall edema evident. No focal suspicious marrow enhancement within the visualized bony anatomy. IMPRESSION: 1. Cholelithiasis without intrahepatic or extrahepatic biliary dilation. No findings to suggest choledocholithiasis. 2. Moderate to large bilateral pleural effusions. 3. Diffuse mesenteric edema with small to moderate volume ascites. 4. Diffuse body wall edema. Electronically Signed   By: Misty Stanley M.D.   On: 07/31/2021 10:03   MR 3D Recon At Scanner  Result Date: 07/31/2021 CLINICAL DATA:  Cholelithiasis on CT imaging. EXAM: MRI ABDOMEN WITHOUT AND WITH CONTRAST (INCLUDING MRCP) TECHNIQUE: Multiplanar multisequence MR imaging of the abdomen was performed both before and after the administration of intravenous contrast. Heavily T2-weighted images of the biliary and pancreatic ducts were obtained, and three-dimensional MRCP images were rendered by post processing. CONTRAST:  7.16mL GADAVIST GADOBUTROL 1 MMOL/ML IV SOLN COMPARISON:  CT and ultrasound exams from 07/30/2021 FINDINGS: Lower chest: Heart is enlarged. Moderate to large bilateral pleural effusions evident. Hepatobiliary: No suspicious focal abnormality within the liver parenchyma. Small focus of differential perfusion identified in the subcapsular liver along the gallbladder fundus, likely benign transient hepatic intensity difference. 1.6 x 1.2 cm gallstone evident. No substantial gallbladder wall thickening or pericholecystic fluid although there is free fluid around the liver. No intrahepatic or extrahepatic biliary dilation. Pancreas: No focal mass lesion. No dilatation of the main duct. No intraparenchymal cyst. No peripancreatic edema. Spleen:  No splenomegaly. No focal mass lesion. Adrenals/Urinary Tract: No adrenal nodule or mass. Right kidney unremarkable. 5 cm simple cyst noted upper pole left kidney  (Bosniak) No followup recommended. Stomach/Bowel: Stomach is unremarkable. No gastric wall thickening. No evidence of outlet obstruction. Duodenum is normally positioned as is the ligament of Treitz. No small bowel or colonic dilatation within the visualized abdomen. Vascular/Lymphatic: No abdominal aortic aneurysm. No abdominal lymphadenopathy. Other: Diffuse mesenteric edema is associated with small to moderate volume ascites. Musculoskeletal: Diffuse body wall edema evident. No focal suspicious marrow enhancement within the visualized bony anatomy. IMPRESSION: 1. Cholelithiasis without intrahepatic or extrahepatic biliary dilation. No findings to suggest choledocholithiasis. 2. Moderate to large bilateral pleural effusions. 3. Diffuse mesenteric edema with small to moderate volume ascites. 4. Diffuse body wall edema. Electronically Signed   By: Misty Stanley M.D.   On: 07/31/2021 10:03   CT ABDOMEN PELVIS WO CONTRAST  Result Date: 07/30/2021 CLINICAL DATA:  Ascites and jaundice. EXAM: CT ABDOMEN AND PELVIS WITHOUT CONTRAST TECHNIQUE: Multidetector CT imaging of the abdomen  and pelvis was performed following the standard protocol without IV contrast. RADIATION DOSE REDUCTION: This exam was performed according to the departmental dose-optimization program which includes automated exposure control, adjustment of the mA and/or kV according to patient size and/or use of iterative reconstruction technique. COMPARISON:  Abdominal ultrasound dated 07/30/2021. FINDINGS: Evaluation of this exam is limited in the absence of intravenous contrast. Lower chest: Partially visualized moderate right and small left pleural effusions with partial compressive atelectasis of the lung bases versus pneumonia. There is mild cardiomegaly with mild biatrial dilatation. No intra-abdominal free air. Diffuse mesenteric edema and small ascites. Hepatobiliary: The liver is unremarkable. No biliary ductal dilatation. There is a gallstone.  High attenuating content within the gallbladder most consistent with vicarious excretion of recently administered contrast. Pancreas: The pancreas is unremarkable as visualized. Spleen: Normal in size without focal abnormality. Adrenals/Urinary Tract: The adrenal glands are unremarkable. There is no hydronephrosis or nephrolithiasis on either side. There is a 4.5 cm left renal upper pole cyst. The visualized ureters and urinary bladder appear unremarkable. Stomach/Bowel: There is sigmoid diverticulosis. There is no bowel obstruction. The appendix is normal. Vascular/Lymphatic: Mild aortoiliac atherosclerotic disease. The IVC is unremarkable. No portal venous gas. There is no adenopathy. Reproductive: The prostate and seminal vesicles are grossly unremarkable. No pelvic mass. Other: Diffuse mesenteric and subcutaneous edema and anasarca. Musculoskeletal: No acute or significant osseous findings. IMPRESSION: 1. Bilateral pleural effusions, small ascites, and anasarca. 2. Cholelithiasis. 3. Sigmoid diverticulosis. No bowel obstruction. Normal appendix. 4. Aortic Atherosclerosis (ICD10-I70.0). Electronically Signed   By: Elgie Collard M.D.   On: 07/30/2021 22:16   US Abdomen Limited RUQ (LIVER/GB)  Result Date: 07/30/2021 CLINICAL DATA:  Jaundice EXAM: ULTRASOUND ABDOMEN LIMITED RIGHT UPPER QUADRANT COMPARISON:  Abdominal ultrasound 01/30/2021 FINDINGS: Gallbladder: 2.2 cm echogenic shadowing calculus. Low-level echogenic sludge. No wall thickening or sonographic Murphy's sign. Common bile duct: Diameter: 4 mm Liver: Increased echogenicity of the parenchyma with no focal mass identified. Portal vein is patent on color Doppler imaging with normal direction of blood flow towards the liver. Other: Right pleural effusion.  Ascites. IMPRESSION: 1. Right pleural effusion. 2. Ascites. 3. Increased echogenicity of the liver parenchyma which may represent hepatic steatosis and/or other hepatocellular disease. 4. Large  gallstone.  Gallbladder sludge. Electronically Signed   By: Jannifer Hick M.D.   On: 07/30/2021 19:11        Scheduled Meds:  insulin aspart  0-6 Units Subcutaneous TID WC   sodium chloride flush  3 mL Intravenous Q12H   Continuous Infusions:  sodium chloride 75 mL/hr at 07/31/21 2036     LOS: 2 days   Time spent= 35 mins    Chayson Charters Joline Maxcy, MD [facility]  If 7PM-7AM, please contact night-coverage  08/01/2021, 7:40 AM

## 2021-08-01 NOTE — Evaluation (Signed)
Occupational Therapy Evaluation Patient Details Name: Erik Chan MRN: [id number] DOB: 1955/12/09 Today's Date: 08/01/2021   History of Present Illness Pt is a 66 y/o male admitted secondary to jaundice and acute hepatitis. Had MRCP. PMH includes CHF, COPD, and DM.   Clinical Impression   Pt admitted for concerns listed above. 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 At this time, pt is limited with mobility due to fatigue and refusal to mobilize past EOB. Pt requiring mod-max A for bed mobility due to weakness in trunk and BLE. At this time, recommending SNF to maximize pts independence and safety prior to returning home. OT will follow acutely.      Recommendations for follow up therapy are one component of a multi-disciplinary discharge planning process, led by the attending physician.  Recommendations may be updated based on patient status, additional functional criteria and insurance authorization.   Follow Up Recommendations  Skilled nursing-short term rehab (<3 hours/day)    Assistance Recommended at Discharge Frequent or constant Supervision/Assistance  Patient can return home with the following A lot of help with walking and/or transfers;A lot of help with bathing/dressing/bathroom;Assistance with cooking/housework;Direct supervision/assist for medications management;Direct supervision/assist for financial management;Assist for transportation;Help with stairs or ramp for entrance    Functional Status Assessment  Patient has had a recent decline in their functional status and demonstrates the ability to make significant improvements in function in a reasonable and predictable amount of time.  Equipment Recommendations  Other (comment) (TBD)    Recommendations for Other Services       Precautions / Restrictions Precautions Precautions: Fall Restrictions Weight Bearing  Restrictions: No      Mobility Bed Mobility Overal bed mobility: Needs Assistance Bed Mobility: Supine to Sit, Sit to Supine     Supine to sit: Mod assist Sit to supine: Max assist   General bed mobility comments: Mod A for trunk support to sitting, max A for assist with both trunk and BLE management    Transfers                   General transfer comment: Deferred due to pt refusal      Balance Overall balance assessment: Needs assistance Sitting-balance support: No upper extremity supported, Feet supported Sitting balance-Leahy Scale: Fair Sitting balance - Comments: intermittently needs min support, possibly due to lethargy                                   ADL either performed or assessed with clinical judgement   ADL Overall ADL's : Needs assistance/impaired Eating/Feeding: Set up;Sitting   Grooming: Set up;Minimal assistance;Sitting   Upper Body Bathing: Minimal assistance;Sitting   Lower Body Bathing: Maximal assistance;Sitting/lateral leans   Upper Body Dressing : Minimal assistance;Sitting   Lower Body Dressing: Maximal assistance;Sitting/lateral leans                 General ADL Comments: Pt remained EOB level this session due to refusing further mobility     Vision Baseline Vision/History: 1 Wears glasses Ability to See in Adequate Light: 0 Adequate Patient Visual Report: No change from baseline Vision Assessment?: No apparent visual deficits     Perception     Praxis      Pertinent Vitals/Pain Pain Assessment Pain Assessment: No/denies pain     Hand Dominance Right  Extremity/Trunk Assessment Upper Extremity Assessment Upper Extremity Assessment: Generalized weakness   Lower Extremity Assessment Lower Extremity Assessment: Defer to PT evaluation   Cervical / Trunk Assessment Cervical / Trunk Assessment: Kyphotic   Communication Communication Communication: No difficulties   Cognition  Arousal/Alertness: Lethargic Behavior During Therapy: Flat affect Overall Cognitive Status: No family/caregiver present to determine baseline cognitive functioning                                       General Comments  VSS on RA    Exercises     Shoulder Instructions      Home Living Family/patient expects to be discharged to:: Private residence Living Arrangements: Spouse/significant other Available Help at Discharge: Family;Available 24 hours/day Type of Home: House Home Access: Stairs to enter [employer] of Steps: 1 Entrance Stairs-Rails: None Home Layout: One level     Bathroom Shower/Tub: Producer, television/film/video: Standard     Home Equipment: Agricultural consultant (2 wheels);BSC/3in1;Shower seat - built in          Prior Functioning/Environment Prior Level of Function : Independent/Modified Independent;Driving             Mobility Comments: Using RW for mobility tasks ADLs Comments: Pt reports independence        OT Problem List: Decreased strength;Decreased activity tolerance;Impaired balance (sitting and/or standing);Decreased safety awareness      OT Treatment/Interventions: Self-care/ADL training;Therapeutic exercise;Energy conservation;DME and/or AE instruction;Therapeutic activities;Cognitive remediation/compensation;Patient/family education;Balance training    OT Goals(Current goals can be found in the care plan section) Acute Rehab OT Goals Patient Stated Goal: To be able to walk again OT Goal Formulation: With patient Time For Goal Achievement: 08/15/21 Potential to Achieve Goals: Good ADL Goals Pt Will Perform Grooming: with min guard assist;standing Pt Will Perform Lower Body Bathing: with min assist;sitting/lateral leans;sit to/from stand Pt Will Perform Lower Body Dressing: with min assist;sitting/lateral leans;sit to/from stand Pt Will Transfer to Toilet: with min assist;ambulating Pt Will Perform  Toileting - Clothing Manipulation and hygiene: with min guard assist;sitting/lateral leans;sit to/from stand  OT Frequency: Min 2X/week    Co-evaluation              AM-PAC OT "6 Clicks" Daily Activity     Outcome Measure Help from another person eating meals?: A Little Help from another person taking care of personal grooming?: A Little Help from another person toileting, which includes using toliet, bedpan, or urinal?: A Lot Help from another person bathing (including washing, rinsing, drying)?: A Lot Help from another person to put on and taking off regular upper body clothing?: A Little Help from another person to put on and taking off regular lower body clothing?: A Lot 6 Click Score: 15   End of Session Nurse Communication: Mobility status  Activity Tolerance: Patient limited by fatigue Patient left: in bed;with call bell/phone within reach;with bed alarm set  OT Visit Diagnosis: Unsteadiness on feet (R26.81);Other abnormalities of gait and mobility (R26.89);Muscle weakness (generalized) (M62.81)                Time: 4315-4008 OT Time Calculation (min): 14 min Charges:  OT General Charges $OT Visit: 1 Visit OT Evaluation $OT Eval Moderate Complexity: 1 Mod  Eyonna Sandstrom H., OTR/L Acute Rehabilitation  Ocie Stanzione Elane Bing Plume 08/01/2021, 2:03 PM

## 2021-08-01 NOTE — H&P (View-Only) (Signed)
Advanced Heart Failure Team Consult Note   Primary Physician: [name], MD PCP-Cardiologist:  Nicki Guadalajara, MD  Reason for Consultation: Acute on chronic biventricular HF and shock liver  HPI:    Erik Chan is seen today d/t concern for acute on chronic biventricular CHF with low-output HF and shock liver at the request of Dr. Signe Colt with Nephrology.   66 y.o. male with tobacco abuse and COPD, and new diagnosis of systolic heart failure/NICM.   Admitted 1/23 with new acute HFrEF. Echo showed  EF < 20%. Started on milrinone. L/RHC on milrinone showed mild CAD, RA 15, PA mean 35, PCW 29 (v 35-40), Fick CO/CI 6.3/2.9. Had > 20% PVC burden with runs of NSVT. Started on IV amio and mexiletine, later switched to amio 200 bid and LifeVest placed. Diuresed with lasix gtt, weight down total of 50 lb during admission. Milrinone weaned off with Co-ox stable at 64.8%.  CT chest showed no sarcoidosis, but moderate right pleural effusion. Underwent right thoracentesis with removal 1200 cc fluid. Underwent cMRI showing LVEF 10%, basal septal wall LGE (mid-wall), apical subendocardial LGE, RV insertion nonspecific scar pattern, RV normal size with severe dysfunction (EF 19%). GDMT limited by low BP. Discharge weight 173 lbs.   Seen for follow-up in April 2023 and doing well. NYHA II. Echo day of visit EF 20-25%. Still requiring midodrine for BP. Referred to EP for consideration of ICD.  Presented to ED 07/30/21 via EMS with complaints of worsening weakness X 2 weeks and recent onset jaundice. EMS had been called on 06/15 after he had been too weak to get off the floor. He was assisted back to bed and remained almost bedbound afterwards. Labs notable for Scr 1.60, BUN 67, Na 122, AST 203, ALT 514, Total bilirubin 25.9, INR 1.6. Acetaminophen and ethanol undetectable. Ammonia level WNL. US abdomen R pleural effusion, + ascites, increased echogenicity of liver, large gallstone with gallbladder sludge. He  was admitted for further workup.   CT abdomen pelvis - bilateral pleural effusions, small ascites, anasarca, + gallstone  MR abdomen - no focal liver abnormality, cholelithiasis without biliary dilatation, moderate to large b/l pleural effusions, mesenteric edema with ascites, diffuse body wall edema  GI following. Lab workup pending.   Nephrology consulted d/t hyponatremia. Had been started on NS at 75/hr which is now off. Concern for a/c CHF with low-output - Advanced Heart Failure asked to evaluate. Given 40 mg lasix IV and started on octreotide + midodrine for soft BP.   Review of Systems: [y] = yes, [ ]  = no   General: Weight gain [ ] ; Weight loss [Y ]; Anorexia [Y]; Fatigue [ ] ; Fever [ ] ; Chills [ ] ; Weakness [ ]   Cardiac: Chest pain/pressure [ ] ; Resting SOB [ ] ; Exertional SOB [ ] ; Orthopnea [Y]; Pedal Edema [Y]; Palpitations [ ] ; Syncope [ ] ; Presyncope [ ] ; Paroxysmal nocturnal dyspnea[ ]   Pulmonary: Cough [ ] ; Wheezing[ ] ; Hemoptysis[ ] ; Sputum [ ] ; Snoring [ ]   GI: Vomiting[ ] ; Dysphagia[ ] ; Melena[ ] ; Hematochezia [ ] ; Heartburn[ ] ; Abdominal pain [ ] ; Constipation [ ] ; Diarrhea [ ] ; BRBPR [ ]   GU: Hematuria[ ] ; Dysuria [ ] ; Nocturia[ ]   Vascular: Pain in legs with walking [ ] ; Pain in feet with lying flat [ ] ; Non-healing sores [ ] ; Stroke [ ] ; TIA [ ] ; Slurred speech [ ] ;  Neuro: Headaches[ ] ; Vertigo[ ] ; Seizures[ ] ; Paresthesias[ ] ;Blurred vision [ ] ; Diplopia [ ] ; Vision changes [ ]   Ortho/Skin: Arthritis [ ] ; Joint pain [ ] ; Muscle pain [ ] ; Joint swelling [ ] ; Back Pain [ ] ; Rash [ ]   Psych: Depression[ ] ; Anxiety[ ]   Heme: Bleeding problems [ ] ; Clotting disorders [ ] ; Anemia [ ]   Endocrine: Diabetes [ ] ; Thyroid dysfunction[ ]   Home Medications Prior to Admission medications   Medication Sig Start Date End Date Taking? Authorizing Provider  acetaminophen (TYLENOL) 500 MG tablet Take 500 mg by mouth every 6 (six) hours as needed for mild pain, fever or headache.   Yes  [provider]  albuterol (VENTOLIN HFA) 108 (90 Base) MCG/ACT inhaler Inhale 1-2 puffs into the lungs every 4 (four) hours as needed for wheezing or shortness of breath. 01/28/21  Yes [provider]  amiodarone (PACERONE) 200 MG tablet Take 1 tablet (200 mg total) by mouth daily. 05/30/21  Yes Nishawn Rotan, , MD  empagliflozin (JARDIANCE) 10 MG TABS tablet Take 1 tablet (10 mg total) by mouth daily. 03/26/21  Yes , MD  mexiletine (MEXITIL) 200 MG capsule Take 1 capsule (200 mg total) by mouth every 12 (twelve) hours. 03/26/21  Yes , MD  midodrine (PROAMATINE) 5 MG tablet Take 1 tablet (5 mg total) by mouth 3 (three) times daily with meals. 04/15/21  Yes [place], , FNP  spironolactone (ALDACTONE) 25 MG tablet Take 1 tablet (25 mg total) by mouth daily. 03/26/21  Yes 01/30/21, MD    Past Medical History: Past Medical History:  Diagnosis Date   CHF (congestive heart failure) (HCC) 02/13/2021   LVEF less than 20%   COPD (chronic obstructive pulmonary disease) (HCC)    Fibromyalgia     Past Surgical History: Past Surgical History:  Procedure Laterality Date   RIGHT/LEFT HEART CATH AND CORONARY ANGIOGRAPHY Chan/A 02/19/2021   Procedure: RIGHT/LEFT HEART CATH AND CORONARY ANGIOGRAPHY;  Surgeon: 03/28/21, MD;  Location: [facility];  Service: Cardiovascular;  Laterality: Chan/A;    Family History: Family History  Problem Relation Age of Onset   Asthma Mother    Allergies Mother    Fibromyalgia Mother    Osteoarthritis Mother    Heart Problems Father    Osteoarthritis Maternal Grandmother     Social History: Social History   Socioeconomic History   Marital status: Married    Spouse name: Not on file   Number of children: Not on file   Years of education: Not on file   Highest education level: Not on file  Occupational History   Occupation: Banker   Tobacco Use   Smoking status: Former    Packs/day:  1.00    Years: 30.00    Total pack years: 30.00    Types: Pipe, Cigarettes   Smokeless tobacco: Never  Vaping Use   Vaping Use: Never used  Substance and Sexual Activity   Alcohol use: No    Alcohol/week: 0.0 standard drinks of alcohol   Drug use: No   Sexual activity: Not on file  Other Topics Concern   Not on file  Social History Narrative   Not on file   Social Determinants of Health   Financial Resource Strain: Not on file  Food Insecurity: Not on file  Transportation Needs: Not on file  Physical Activity: Not on file  Stress: Not on file  Social Connections: Not on file    Allergies:  Allergies  Allergen Reactions   Shellfish-Derived Products Hives and Rash    Objective:  Vital Signs:   Temp:  [97.3 F (36.3 C)-98 F (36.7 C)] 98 F (36.7 C) (06/22 0947) Pulse Rate:  [68-71] 70 (06/22 0947) Resp:  [17-20] 18 (06/22 0947) BP: (98-110)/(65-71) 98/66 (06/22 0947) SpO2:  [96 %-99 %] 99 % (06/22 0947) Weight:  [75.3 kg] 75.3 kg (06/22 0500) Last BM Date :  (PTA)  Weight change: Filed Weights   07/30/21 1650 08/01/21 0500  Weight: 74.4 kg 75.3 kg    Intake/Output:   Intake/Output Summary (Last 24 hours) at 08/01/2021 1203 Last data filed at 08/01/2021 0800 Gross per 24 hour  Intake 1153.07 ml  Output 400 ml  Net 753.07 ml      Physical Exam    General:  Sitting up in bed. Ill appearing. Cachectic. HEENT: normal SKIN: jaundice Neck: supple. JVP to ear . Carotids 2+ bilat; no bruits.  Cor: PMI nondisplaced. Regular rate & rhythm. No rubs, gallops or murmurs. Lungs: diminished, poor effort Abdomen: soft, distended Extremities: no cyanosis, clubbing, rash, 2 + edema Neuro: alert & orientedx3, affect flat, does not appear confused   Telemetry   Not on telemetry  EKG    SR 1st degree AVB 69 bpm  Labs   Basic Metabolic Panel: Recent Labs  Lab 07/30/21 1722 07/31/21 0310 07/31/21 1046 07/31/21 1654 07/31/21 2329 08/01/21 0422  NA  122* 119* 121* 120* 118* 116*  K 4.6 4.6  --   --   --  4.2  CL 84* 86*  --   --   --  85*  CO2 23 21*  --   --   --  19*  GLUCOSE 138* 120*  --   --   --  105*  BUN 67* 67*  --   --   --  54*  CREATININE 1.60* 1.46*  --   --   --  1.31*  CALCIUM 8.9 8.5*  --   --   --  7.8*  MG  --   --   --   --   --  3.3*    Liver Function Tests: Recent Labs  Lab 07/30/21 1722 07/31/21 0310 08/01/21 0422  AST 203* 184* 153*  ALT 514* 446* 361*  ALKPHOS 116 103 107  BILITOT 25.9* 23.9* 24.6*  PROT 7.4 6.7 6.5  ALBUMIN 3.5 3.2* 3.0*   Recent Labs  Lab 07/30/21 2150  LIPASE 56*   Recent Labs  Lab 07/30/21 1722  AMMONIA 22    CBC: Recent Labs  Lab 07/30/21 1722 07/31/21 0310 08/01/21 0422  WBC 6.5 7.3 6.8  NEUTROABS 5.2  --   --   HGB 15.0 15.1 14.2  HCT 41.7 41.9 40.0  MCV 89.9 90.3 88.9  PLT PLATELET CLUMPS NOTED ON SMEAR, COUNT APPEARS DECREASED 43* 48*    Cardiac Enzymes: No results for input(s): "CKTOTAL", "CKMB", "CKMBINDEX", "TROPONINI" in the last 168 hours.  BNP: BNP (last 3 results) Recent Labs    02/01/21 1522 02/13/21 1709  BNP 2,411.9* 1,831.1*    ProBNP (last 3 results) No results for input(s): "PROBNP" in the last 8760 hours.   CBG: Recent Labs  Lab 07/31/21 1146 07/31/21 1833 07/31/21 2051 08/01/21 0735 08/01/21 1140  GLUCAP 112* 127* 105* 101* 100*    Coagulation Studies: Recent Labs    07/30/21 1722 08/01/21 0422  LABPROT 18.7* 17.7*  INR 1.6* 1.5*     Imaging   No results found.   Medications:     Current Medications:  feeding supplement  237 mL  Oral BID BM   furosemide  40 mg Intravenous Daily   insulin aspart  0-6 Units Subcutaneous TID WC   lactulose  20 g Oral BID   midodrine  10 mg Oral TID WC   multivitamin with minerals  1 tablet Oral Daily   octreotide  100 mcg Subcutaneous TID   sodium chloride flush  3 mL Intravenous Q12H    Infusions:     Patient Profile   66 y.o. male with history of chronic  biventricular HF/NICM, PVCs/NSVT, COPD, hx pleural effusion s/p right thoracentesis 01/23, DM II. Admitted with progressive weakness in setting of elevated LFTs, hyponatremia, AKI and concern for a/c biventricular HF with low-output.  Assessment/Plan   1. Chronic Biventricular HFrEF/ NICM - Echo (1/23): severely reduced EF < 20% and severely reduced RV. Moderate to severe MR.  - L/RHC (1/23): moderate non-obstructive CAD. EF < 10% - cMRI (1/23): LVEF 10%, severe biventricular heart failure, basal septal wall mid-wall LGE, apical inferior subendocardial LGE, and RV insertion non specific scar pattern.  - CT chest (1/23): without evidence for pulmonary sarcoidosis.   - Myeloma panel and SPEP/UPEP negative. ACE level OK (37). - Etiology for CM => isolated cardiac sarcoidosis vs prior myocarditis vs genetic cardiomyopathy, +/- PVCs - Cardiac PET previously set up at [facility], has not completed. Genetic testing sent 03/04/21. - Echo 05/30/21 EF 20-25% RV severely reduced.  - Referred to EP for ICD in April - has not scheduled appointment. Missed last clinic f/u.  - Volume appears elevated. Evidence of anasarca and ascites on imaging. Given 40 mg lasix IV today per Nephrology. - Continue midodrine 10 TID - BP too soft for GDMT - Concern low-output HF may be contributing to acute liver failure and hyponatremia. Tentatively planning for RHC today to assess filling pressures and cardiac output with simultaneous measurement of hepatic venous pressure gradient. - Worry that overall prognosis is poor. Agree with Palliative Care consult  2. Acute liver failure - US abdomen R pleural effusion, + ascites, increased echogenicity of liver, large gallstone with gallbladder sludge. - CT abdomen pelvis - bilateral pleural effusions, small ascites, anasarca, + gallstone - MR abdomen - no focal liver abnormality, cholelithiasis without biliary dilatation, moderate to large b/l pleural effusions, mesenteric edema with  ascites, diffuse body wall edema - Hepatitis panel negative. Other serologies pending. - [facility] following.  - Labs today: AST 153, ALT 361, Total Bili 24.6 - Midodrine and Octreotide - RHC as above to better determine how much HF contributing to liver failure  3. Hyponatremia - Hypervolemic hyponatremia - Na 116 - Does not appear confused - Not a good candidate for Tolvaptan with liver dysfunction - Nephrology following  - Diuresis + fluid restriction  4. AKI: - Scr baseline ~ 1, this admit 1.6>1.3 - Midodrine to support BP   5. PVCs/NSVT - >20% PVC burden during admit 01/23 - Will hold off on restarting amiodarone and mexiletine for now with acute liver failure - No OSA on sleep study 2/23. - Place on telemetry monitoring    6. COPD - Former heavy smoker. Quit smoking 01/07/21.   7. H/o Pleural Effusion - s/p rt thoracentesis 1/23 w/ 1200 cc fluid removal. - moderate to large b/l pleural effusions on MR this admit - ? Thoracentesis if not improving with diuresis   8. DM2: - A1c 6.7 (1/23). - SGLT2i on hold for now   Length of Stay: 2  Erik Chan, Erik N, PA-C  08/01/2021, 12:03 PM  Advanced  Heart Failure Team Pager [phone] (M-F; 7a - 5p)  Please contact [facility] for night-coverage after hours (4p -7a ) and weekends on [email or web address]   Patient seen and examined with the above-signed Advanced Practice Provider and/or Housestaff. I personally reviewed laboratory data, imaging studies and relevant notes. I independently examined the patient and formulated the important aspects of the plan. I have edited the note to reflect any of my changes or salient points. I have personally discussed the plan with the patient and/or family.  66 y/o male as above with severe biventricular HF due to NICM. ? PVC related.   Now admitted with fulminant liver failure, massive volume overload and severe hyponatremia. ?hepato-renal syndrome has been raised. Concern also over degree of  cardiac involvement versus primary liver dysfunction .  Very weak. Denies CP, orthopnea or PND  General:  Weak appearing. No resp difficulty + jaundiced HEENT: normal Neck: supple. Jvp to ear  Carotids 2+ bilat; no bruits. No lymphadenopathy or thryomegaly appreciated. Cor: PMI nondisplaced. Regular rate & rhythm. 2/6 TR Lungs: clear Abdomen: soft, nontender, + distended. No hepatosplenomegaly. No bruits or masses. Good bowel sounds. Extremities: no cyanosis, clubbing, rash, 2+ edema Neuro: alert & orientedx3, cranial nerves grossly intact. moves all 4 extremities w/o difficulty. Affect pleasant  Suspect he is end-stage. Degree of hyperbilrubinemia seems out of proportion to what we see in HF but agree that this may be multifactorial. I had long talk with him about his situation. He wants everything possible done. Will plan RHC today with hepatic wedge to assess cardiac involvement. Agree with HRS as strong consideration.   Will place central line at same time for ease of management.   Erik Bickers, MD  5:03 PM

## 2021-08-01 NOTE — Interval H&P Note (Signed)
History and Physical Interval Note:  08/01/2021 5:05 PM  Erik Chan  has presented today for surgery, with the diagnosis of chf.  The various methods of treatment have been discussed with the patient and family. After consideration of risks, benefits and other options for treatment, the patient has consented to  Procedure(s): RIGHT HEART CATH (N/A) and central line placement as a surgical intervention.  The patient's history has been reviewed, patient examined, no change in status, stable for surgery.  I have reviewed the patient's chart and labs.  Questions were answered to the patient's satisfaction.     Bernabe Dorce

## 2021-08-01 NOTE — Progress Notes (Signed)
Date and time results received: 08/01/21 0520  Test: sodium 116 , total bilirubin 24.6 Critical Value: 116  , 24.6  Name of Provider Notified: Pierre Bali DO  Orders Received? Or Actions Taken?:  none so far

## 2021-08-02 ENCOUNTER — Other Ambulatory Visit (HOSPITAL_COMMUNITY): Payer: Self-pay | Admitting: Internal Medicine

## 2021-08-02 DIAGNOSIS — Z515 Encounter for palliative care: Secondary | ICD-10-CM | POA: Diagnosis not present

## 2021-08-02 DIAGNOSIS — E43 Unspecified severe protein-calorie malnutrition: Secondary | ICD-10-CM | POA: Insufficient documentation

## 2021-08-02 DIAGNOSIS — B179 Acute viral hepatitis, unspecified: Secondary | ICD-10-CM | POA: Diagnosis not present

## 2021-08-02 LAB — CBC
HCT: 38 % — ABNORMAL LOW (ref 39.0–52.0)
Hemoglobin: 13.4 g/dL (ref 13.0–17.0)
MCH: 31.7 pg (ref 26.0–34.0)
MCHC: 35.3 g/dL (ref 30.0–36.0)
MCV: 89.8 fL (ref 80.0–100.0)
Platelets: 58 10*3/uL — ABNORMAL LOW (ref 150–400)
RBC: 4.23 MIL/uL (ref 4.22–5.81)
RDW: 18.3 % — ABNORMAL HIGH (ref 11.5–15.5)
WBC: 7.5 10*3/uL (ref 4.0–10.5)
nRBC: 0 % (ref 0.0–0.2)

## 2021-08-02 LAB — COMPREHENSIVE METABOLIC PANEL
ALT: 300 U/L — ABNORMAL HIGH (ref 0–44)
AST: 130 U/L — ABNORMAL HIGH (ref 15–41)
Albumin: 2.9 g/dL — ABNORMAL LOW (ref 3.5–5.0)
Alkaline Phosphatase: 101 U/L (ref 38–126)
Anion gap: 12 (ref 5–15)
BUN: 51 mg/dL — ABNORMAL HIGH (ref 8–23)
CO2: 23 mmol/L (ref 22–32)
Calcium: 8.2 mg/dL — ABNORMAL LOW (ref 8.9–10.3)
Chloride: 85 mmol/L — ABNORMAL LOW (ref 98–111)
Creatinine, Ser: 1.31 mg/dL — ABNORMAL HIGH (ref 0.61–1.24)
GFR, Estimated: >60 mL/min (ref >60)
Glucose, Bld: 89 mg/dL (ref 70–99)
Potassium: 4.6 mmol/L (ref 3.5–5.1)
Sodium: 120 mmol/L — ABNORMAL LOW (ref 135–145)
Total Bilirubin: 24.9 mg/dL (ref 0.3–1.2)
Total Protein: 6.2 g/dL — ABNORMAL LOW (ref 6.5–8.1)

## 2021-08-02 LAB — SODIUM
Sodium: 119 mmol/L — CL (ref 135–145)
Sodium: 118 mmol/L — CL (ref 135–145)

## 2021-08-02 LAB — COOXEMETRY PANEL
Carboxyhemoglobin: 2.3 % — ABNORMAL HIGH (ref 0.5–1.5)
Methemoglobin: <0.7 % (ref 0.0–1.5)
O2 Saturation: 60.8 %
Total hemoglobin: 14.1 g/dL (ref 12.0–16.0)

## 2021-08-02 LAB — GLUCOSE, CAPILLARY
Glucose-Capillary: 92 mg/dL (ref 70–99)
Glucose-Capillary: 114 mg/dL — ABNORMAL HIGH (ref 70–99)
Glucose-Capillary: 117 mg/dL — ABNORMAL HIGH (ref 70–99)
Glucose-Capillary: 141 mg/dL — ABNORMAL HIGH (ref 70–99)

## 2021-08-02 LAB — SODIUM, URINE, RANDOM: Sodium, Ur: 33 mmol/L

## 2021-08-02 LAB — EPSTEIN BARR VRS(EBV DNA BY PCR): EBV DNA QN by PCR: NEGATIVE IU/mL

## 2021-08-02 MED ORDER — MILRINONE LACTATE IN DEXTROSE 20-5 MG/100ML-% IV SOLN
0.1250 ug/kg/min | INTRAVENOUS | Status: DC
  Administered 2021-08-02 – 2021-08-05 (×5): 0.25 ug/kg/min via INTRAVENOUS
  Administered 2021-08-06 – 2021-08-11 (×6): 0.125 ug/kg/min via INTRAVENOUS
  Filled 2021-08-02 (×11): qty 100

## 2021-08-02 MED ORDER — SODIUM CHLORIDE 0.9% FLUSH: 3.0000 mL | INTRAVENOUS | Status: DC | PRN

## 2021-08-02 MED ORDER — FUROSEMIDE 10 MG/ML IJ SOLN
8.0000 mg/h | INTRAVENOUS | Status: DC
  Administered 2021-08-02 – 2021-08-03 (×3): 15 mg/h via INTRAVENOUS
  Administered 2021-08-04: 8 mg/h via INTRAVENOUS
  Administered 2021-08-04: 15 mg/h via INTRAVENOUS
  Filled 2021-08-02 (×6): qty 20

## 2021-08-02 MED ORDER — HYDRALAZINE HCL 20 MG/ML IJ SOLN: 10.0000 mg | INTRAMUSCULAR | Status: AC | PRN

## 2021-08-02 MED ORDER — SODIUM CHLORIDE 0.9 % IV SOLN: 250.0000 mL | INTRAVENOUS | Status: DC | PRN

## 2021-08-02 MED ORDER — LABETALOL HCL 5 MG/ML IV SOLN: 10.0000 mg | INTRAVENOUS | Status: AC | PRN

## 2021-08-02 MED ORDER — SODIUM CHLORIDE 0.9% FLUSH
3.0000 mL | Freq: Two times a day (BID) | INTRAVENOUS | Status: DC
  Administered 2021-08-02 – 2021-08-11 (×12): 3 mL via INTRAVENOUS

## 2021-08-02 MED ORDER — ONDANSETRON HCL 4 MG/2ML IJ SOLN: 4.0000 mg | Freq: Four times a day (QID) | INTRAMUSCULAR | Status: DC | PRN

## 2021-08-02 MED ORDER — CHLORHEXIDINE GLUCONATE CLOTH 2 % EX PADS
6.0000 | MEDICATED_PAD | Freq: Every day | CUTANEOUS | Status: DC
  Administered 2021-08-02 – 2021-08-15 (×14): 6 via TOPICAL

## 2021-08-02 MED ORDER — FUROSEMIDE 10 MG/ML IJ SOLN
80.0000 mg | Freq: Once | INTRAMUSCULAR | Status: AC
  Administered 2021-08-02: 80 mg via INTRAVENOUS
  Filled 2021-08-02: qty 8

## 2021-08-02 MED ORDER — ACETAMINOPHEN 325 MG PO TABS
650.0000 mg | ORAL_TABLET | ORAL | Status: DC | PRN
  Administered 2021-08-04: 650 mg via ORAL
  Filled 2021-08-02 (×2): qty 2

## 2021-08-02 MED ORDER — SODIUM CHLORIDE 0.9% FLUSH
3.0000 mL | Freq: Two times a day (BID) | INTRAVENOUS | Status: DC
  Administered 2021-08-02 – 2021-08-11 (×13): 3 mL via INTRAVENOUS

## 2021-08-03 DIAGNOSIS — I50812 Chronic right heart failure: Secondary | ICD-10-CM | POA: Diagnosis not present

## 2021-08-03 DIAGNOSIS — B179 Acute viral hepatitis, unspecified: Secondary | ICD-10-CM | POA: Diagnosis not present

## 2021-08-03 DIAGNOSIS — I5022 Chronic systolic (congestive) heart failure: Secondary | ICD-10-CM | POA: Diagnosis not present

## 2021-08-03 DIAGNOSIS — N179 Acute kidney failure, unspecified: Secondary | ICD-10-CM | POA: Diagnosis not present

## 2021-08-03 LAB — COMPREHENSIVE METABOLIC PANEL
ALT: 241 U/L — ABNORMAL HIGH (ref 0–44)
AST: 115 U/L — ABNORMAL HIGH (ref 15–41)
Albumin: 2.6 g/dL — ABNORMAL LOW (ref 3.5–5.0)
Alkaline Phosphatase: 89 U/L (ref 38–126)
Anion gap: 12 (ref 5–15)
BUN: 46 mg/dL — ABNORMAL HIGH (ref 8–23)
CO2: 26 mmol/L (ref 22–32)
Calcium: 7.8 mg/dL — ABNORMAL LOW (ref 8.9–10.3)
Chloride: 83 mmol/L — ABNORMAL LOW (ref 98–111)
Creatinine, Ser: 1.22 mg/dL (ref 0.61–1.24)
GFR, Estimated: >60 mL/min (ref >60)
Glucose, Bld: 110 mg/dL — ABNORMAL HIGH (ref 70–99)
Potassium: 3.4 mmol/L — ABNORMAL LOW (ref 3.5–5.1)
Sodium: 121 mmol/L — ABNORMAL LOW (ref 135–145)
Total Bilirubin: 25.1 mg/dL (ref 0.3–1.2)
Total Protein: 5.7 g/dL — ABNORMAL LOW (ref 6.5–8.1)

## 2021-08-03 LAB — CBC
HCT: 34.9 % — ABNORMAL LOW (ref 39.0–52.0)
Hemoglobin: 12.4 g/dL — ABNORMAL LOW (ref 13.0–17.0)
MCH: 31.3 pg (ref 26.0–34.0)
MCHC: 35.5 g/dL (ref 30.0–36.0)
MCV: 88.1 fL (ref 80.0–100.0)
Platelets: 65 10*3/uL — ABNORMAL LOW (ref 150–400)
RBC: 3.96 MIL/uL — ABNORMAL LOW (ref 4.22–5.81)
RDW: 18.2 % — ABNORMAL HIGH (ref 11.5–15.5)
WBC: 8.3 10*3/uL (ref 4.0–10.5)
nRBC: 0 % (ref 0.0–0.2)

## 2021-08-03 LAB — LIPOPROTEIN A (LPA): Lipoprotein (a): <8.4 nmol/L (ref <75.0)

## 2021-08-03 LAB — GLUCOSE, CAPILLARY
Glucose-Capillary: 109 mg/dL — ABNORMAL HIGH (ref 70–99)
Glucose-Capillary: 106 mg/dL — ABNORMAL HIGH (ref 70–99)
Glucose-Capillary: 95 mg/dL (ref 70–99)
Glucose-Capillary: 90 mg/dL (ref 70–99)

## 2021-08-03 LAB — COOXEMETRY PANEL
Carboxyhemoglobin: 2.5 % — ABNORMAL HIGH (ref 0.5–1.5)
Methemoglobin: <0.7 % (ref 0.0–1.5)
O2 Saturation: 76.5 %
Total hemoglobin: 12.8 g/dL (ref 12.0–16.0)

## 2021-08-03 LAB — MAGNESIUM: Magnesium: 2.7 mg/dL — ABNORMAL HIGH (ref 1.7–2.4)

## 2021-08-03 LAB — SODIUM
Sodium: 120 mmol/L — ABNORMAL LOW (ref 135–145)
Sodium: 119 mmol/L — CL (ref 135–145)

## 2021-08-03 MED ORDER — POTASSIUM CHLORIDE CRYS ER 20 MEQ PO TBCR
40.0000 meq | EXTENDED_RELEASE_TABLET | Freq: Two times a day (BID) | ORAL | Status: DC
  Administered 2021-08-03 – 2021-08-04 (×4): 40 meq via ORAL
  Filled 2021-08-03 (×4): qty 2

## 2021-08-03 MED ORDER — SPIRONOLACTONE 12.5 MG HALF TABLET
12.5000 mg | ORAL_TABLET | Freq: Every day | ORAL | Status: DC
  Administered 2021-08-03 – 2021-08-16 (×14): 12.5 mg via ORAL
  Filled 2021-08-03 (×14): qty 1

## 2021-08-03 NOTE — Progress Notes (Signed)
[facility]  PROGRESS NOTE  Erik Chan ZHY:[id number] DOB: 1955/04/08 DOA: 07/30/2021 PCP: [name], MD   Brief HPI:   66 year old with history of COPD, nonischemic cardiomyopathy with reduced EF 20% comes to the ER with painless jaundice and loss of appetite.  Apparently patient felt very weak on 6/15 and laid on the floor and was unable to get back in her bed.  EMS was called to help with this.  Due to progressive weakness patient was brought to the hospital.  Upon admission patient was noted to be hyponatremic, elevated creatinine, transaminitis.  Ammonia level was normal.  Right upper quadrant ultrasound showed pleural effusion, ascites.  GI was consulted for acute hepatitis along with nephrology and CHF team due to concerns of low output cardiac failure.  Underwent RHC showing biventricular failure and started on milrinone and Lasix.    Subjective   Patient seen and examined, denies shortness of breath.   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  Most of the autoimmune panel is unremarkable.  Slightly elevated ceruloplasmin level.  No plans for liver biopsy per GI for now. - Right upper quadrant ultrasound showed hepatic steatosis, large gallstone - MRCP is neg for obs stones, showed effusion, ascites, bowel wall edema.  -Total bili 24.9   Acute on chronic systolic CHF; Biventricular failure, class IV - NICM with LV EF 20-25% and severely reduced RV systolic function on TTE in April 2023.     -Heart failure team following  -Underwent RHC 6/22 showing biventricular failure and concern for low output.   -Patient started on milrinone and Lasix.   Hyponatremia; slowly improving.  - Concerns of this is secondary to low output cardiac failure.  Placed on fluid restriction.   Currently on milrinone and Lasix.   -Sodium level is 119 -Nephrology following   Acute kidney injury; Improving -Admission creatinine 1.6>1.3.  Baseline 1.0.  -Creatinine has improved to 1.22   COPD -Not in exacerbation.  As needed bronchodilators.   Recurrent right-sided pleural effusion - We will monitor   Diabetes mellitus type 2, adequately controlled -Hemoglobin A1c 6.7 in January 2023.  - On sliding scale and Accu-Cheks. -CBG well controlled   Severe Protein Cal malnutrition -Dietician consulted.      Medications     Chlorhexidine Gluconate Cloth  6 each Topical Daily   feeding supplement  237 mL Oral BID BM   insulin aspart  0-6 Units Subcutaneous TID WC   lactulose  20 g Oral BID   midodrine  10 mg Oral TID WC   multivitamin with minerals  1 tablet Oral Daily   octreotide  100 mcg Subcutaneous TID   potassium chloride  40 mEq Oral BID   sodium chloride flush  3 mL Intravenous Q12H   sodium chloride flush  3 mL Intravenous Q12H   sodium chloride flush  3 mL Intravenous Q12H   spironolactone  12.5 mg Oral Daily     Data Reviewed:   CBG:  Recent Labs  Lab 08/02/21 1620 08/02/21 2113 08/03/21 0550 08/03/21 1144 08/03/21 1540  GLUCAP 117* 141* 109* 106* 95    SpO2: 92 % O2 Flow Rate (L/min): 2 L/min    Vitals:   08/03/21 0447 08/03/21 0729 08/03/21 1100 08/03/21 1538  BP:  (!) 95/53 (!) 95/53 (!) 95/53  Pulse:  75 72 73  Resp:  17 16 16   Temp:  97.6 F (36.4 C) 98.1 F (36.7 C) 97.6 F (36.4 C)  TempSrc:  Oral Oral Oral  SpO2:  93% 92% 92%  Weight: 74.6 kg     Height:          Data Reviewed:  Basic Metabolic Panel: Recent Labs  Lab 07/30/21 1722 07/31/21 0310 07/31/21 1046 08/01/21 0422 08/01/21 1112 08/01/21 1716 08/01/21 1727 08/01/21 2253 08/02/21 0439 08/02/21 1355 08/02/21 1950 08/03/21 0030 08/03/21 0537 08/03/21 1135  NA 122* 119*   < > 116*   < > 118* 119*  119*   < > 120* 119* 118* 120* 121* 119*  K 4.6 4.6   --  4.2  --  4.6 4.4  4.4  --  4.6  --   --   --  3.4*  --   CL 84* 86*  --  85*  --   --   --   --  85*  --   --   --  83*  --   CO2 23 21*  --  19*  --   --   --   --  23  --   --   --  26  --   GLUCOSE 138* 120*  --  105*  --   --   --   --  89  --   --   --  110*  --   BUN 67* 67*  --  54*  --   --   --   --  51*  --   --   --  46*  --   CREATININE 1.60* 1.46*  --  1.31*  --   --   --   --  1.31*  --   --   --  1.22  --   CALCIUM 8.9 8.5*  --  7.8*  --   --   --   --  8.2*  --   --   --  7.8*  --   MG  --   --   --  3.3*  --   --   --   --   --   --   --   --  2.7*  --    < > = values in this interval not displayed.    CBC: Recent Labs  Lab 07/30/21 1722 07/31/21 0310 08/01/21 0422 08/01/21 1716 08/01/21 1727 08/02/21 0439 08/03/21 0537  WBC 6.5 7.3 6.8  --   --  7.5 8.3  NEUTROABS 5.2  --   --   --   --   --   --   HGB 15.0 15.1 14.2 16.3 15.6  16.3 13.4 12.4*  HCT 41.7 41.9 40.0 48.0 46.0  48.0 38.0* 34.9*  MCV 89.9 90.3 88.9  --   --  89.8 88.1  PLT PLATELET CLUMPS NOTED ON SMEAR, COUNT APPEARS DECREASED 43* 48*  --   --  58* 65*    LFT Recent Labs  Lab 07/30/21 1722 07/31/21 0310 08/01/21 0422 08/02/21 0439 08/03/21 0537  AST 203* 184* 153* 130* 115*  ALT 514* 446* 361* 300* 241*  ALKPHOS 116 103 107 101 89  BILITOT 25.9* 23.9* 24.6* 24.9* 25.1*  PROT 7.4 6.7 6.5 6.2* 5.7*  ALBUMIN 3.5 3.2* 3.0* 2.9* 2.6*     Antibiotics: Anti-infectives (From admission, onward)    None        DVT prophylaxis: SCDs  Code Status: Full code  Family Communication: No family at bedside   CONSULTS cardiology, nephrology   Objective    Physical Examination:   General-appears in no acute distress Heart-S1-S2, regular, no murmur auscultated Lungs-clear to auscultation bilaterally, no wheezing or crackles auscultated Abdomen-soft, nontender, no organomegaly Extremities-no edema in the lower extremities Neuro-alert, oriented x3, no focal deficit  noted Skin-icterus noted on face,chest,arms   Status is: Inpatient: Acute decompensated liver failure    Pressure Injury 07/31/21 Sacrum Stage 2 -  Partial thickness loss of dermis presenting as a shallow open injury with a red, pink wound bed without slough. (Active)  07/31/21 1830  Location: Sacrum  Location Orientation:   Staging: Stage 2 -  Partial thickness loss of dermis presenting as a shallow open injury with a red, pink wound bed without slough.  Wound Description (Comments):   Present on Admission: Yes        Decklyn Hornik S Letty Salvi   [facility] If 7PM-7AM, please contact night-coverage at [email or web address], Office  [phone]   08/03/2021, 5:07 PM  LOS: 4 days

## 2021-08-04 DIAGNOSIS — I50812 Chronic right heart failure: Secondary | ICD-10-CM | POA: Diagnosis not present

## 2021-08-04 DIAGNOSIS — Z515 Encounter for palliative care: Secondary | ICD-10-CM | POA: Diagnosis not present

## 2021-08-04 DIAGNOSIS — B179 Acute viral hepatitis, unspecified: Secondary | ICD-10-CM | POA: Diagnosis not present

## 2021-08-04 DIAGNOSIS — N179 Acute kidney failure, unspecified: Secondary | ICD-10-CM | POA: Diagnosis not present

## 2021-08-04 DIAGNOSIS — I5022 Chronic systolic (congestive) heart failure: Secondary | ICD-10-CM | POA: Diagnosis not present

## 2021-08-04 LAB — COOXEMETRY PANEL
Carboxyhemoglobin: 3.3 % — ABNORMAL HIGH (ref 0.5–1.5)
Methemoglobin: 0.7 % (ref 0.0–1.5)
O2 Saturation: 76.7 %
Total hemoglobin: 12.7 g/dL (ref 12.0–16.0)

## 2021-08-04 LAB — HEPATIC FUNCTION PANEL
ALT: 214 U/L — ABNORMAL HIGH (ref 0–44)
AST: 116 U/L — ABNORMAL HIGH (ref 15–41)
Albumin: 2.5 g/dL — ABNORMAL LOW (ref 3.5–5.0)
Alkaline Phosphatase: 80 U/L (ref 38–126)
Bilirubin, Direct: 17.4 mg/dL — ABNORMAL HIGH (ref 0.0–0.2)
Indirect Bilirubin: 8.8 mg/dL — ABNORMAL HIGH (ref 0.3–0.9)
Total Bilirubin: 26.2 mg/dL (ref 0.3–1.2)
Total Protein: 5.9 g/dL — ABNORMAL LOW (ref 6.5–8.1)

## 2021-08-04 LAB — BASIC METABOLIC PANEL
Anion gap: 14 (ref 5–15)
BUN: 39 mg/dL — ABNORMAL HIGH (ref 8–23)
CO2: 27 mmol/L (ref 22–32)
Calcium: 7.8 mg/dL — ABNORMAL LOW (ref 8.9–10.3)
Chloride: 81 mmol/L — ABNORMAL LOW (ref 98–111)
Creatinine, Ser: 1.19 mg/dL (ref 0.61–1.24)
GFR, Estimated: >60 mL/min (ref >60)
Glucose, Bld: 85 mg/dL (ref 70–99)
Potassium: 3.9 mmol/L (ref 3.5–5.1)
Sodium: 122 mmol/L — ABNORMAL LOW (ref 135–145)

## 2021-08-04 LAB — GLUCOSE, CAPILLARY
Glucose-Capillary: 83 mg/dL (ref 70–99)
Glucose-Capillary: 91 mg/dL (ref 70–99)
Glucose-Capillary: 73 mg/dL (ref 70–99)
Glucose-Capillary: 92 mg/dL (ref 70–99)

## 2021-08-04 LAB — MAGNESIUM: Magnesium: 2.4 mg/dL (ref 1.7–2.4)

## 2021-08-04 MED ORDER — ORAL CARE MOUTH RINSE: 15.0000 mL | OROMUCOSAL | Status: DC | PRN

## 2021-08-04 NOTE — Progress Notes (Signed)
[facility] Progress Note    Assessment/ Plan:    Hyponatremia:  Appears to be hypervolemic hyponatremia based on physical exam and urine electrolytes.  Does not have symptoms- don't think 3% necessary at present, also not recommended by PCCM.             - fluid restrict to 1.2L daily             - continue midodrine and octreotide             ? If this unifying diagnosis is delayed presentation of CHF exacerbation with low flow state and shock liver with MSOF--> Tbili really high though- maybe mixed pic of low flow and intrinsic liver disease, appreciate GI              - Na slowly improving  - continue Lasix gtt and milrinone per CHF  - vaptan a suboptimal choice now given liver function  - nothing further to add- I expect Na will continue to improve with restoration of euvolemia. Will sign off.  Call with questions.   2.  Acute decompensated liver failure: no history of this previously             - GI following  - had abd [place] and MRI without structural etiology             - shock liver vs autoimmune vs other             - possibly for para and/or biopsy             - added midodrine and octreotide   - HW on RHC was 26, correlating with RA pressures   3.  Acute on chronic biventricular CHF: cardiac MRI 02/2021 showed EF < 20% and severely reduced RV dysfunction             - volume up, however had 2.5L UOP             - s/p RHC 08/01/21 showing severe biventricular CHF, low output state  - on milrinone and Lasix gtt   4.  AKI: Cr was previously WNL             - suspect cardiorenal/ hepatorenal combination thereof             - octreotide and midodrine  - slowly improving   5.  Dispo: inpatient.  Prognosis looks poor.  I think palliative care c/s is an appropriate choice  Subjective:    Seen in room.  Nearly 4L UOP yesterday, and Na up to 122.  He is awake and alert, wife with him.     Objective:   BP (!) 84/44 (BP Location: Left Arm)   Pulse 84   Temp 97.6  F (36.4 C) (Oral)   Resp 17   Ht 6\' 2"  (1.88 m)   Wt 76.6 kg   SpO2 93%   BMI 21.68 kg/m   Intake/Output Summary (Last 24 hours) at 08/04/2021 1011 Last data filed at 08/04/2021 0636 Gross per 24 hour  Intake 736.69 ml  Output 3350 ml  Net -2613.31 ml   Weight change: 2 kg  Physical Exam: GEN, sitting up in bed, NAD HEENT + Jaundice NECK+ JVD with + HJR, largely unchanged PULM clear anteriorly CV RRR with + S3 ABD soft, distended, + fluid wave EXT 2+ anasarca, only slightly improved NEURO AAO x 3, no asterixis SKIN: jaundiced  Imaging: No results found.  Labs: BMET Recent  Labs  Lab 07/30/21 1722 07/31/21 0310 07/31/21 1046 08/01/21 0422 08/01/21 1112 08/01/21 1716 08/01/21 1727 08/01/21 2253 08/02/21 0439 08/02/21 1355 08/02/21 1950 08/03/21 0030 08/03/21 0537 08/03/21 1135 08/04/21 0323  NA 122* 119*   < > 116*   < > 118* 119*  119*   < > 120* 119* 118* 120* 121* 119* 122*  K 4.6 4.6  --  4.2  --  4.6 4.4  4.4  --  4.6  --   --   --  3.4*  --  3.9  CL 84* 86*  --  85*  --   --   --   --  85*  --   --   --  83*  --  81*  CO2 23 21*  --  19*  --   --   --   --  23  --   --   --  26  --  27  GLUCOSE 138* 120*  --  105*  --   --   --   --  89  --   --   --  110*  --  85  BUN 67* 67*  --  54*  --   --   --   --  51*  --   --   --  46*  --  39*  CREATININE 1.60* 1.46*  --  1.31*  --   --   --   --  1.31*  --   --   --  1.22  --  1.19  CALCIUM 8.9 8.5*  --  7.8*  --   --   --   --  8.2*  --   --   --  7.8*  --  7.8*   < > = values in this interval not displayed.   CBC Recent Labs  Lab 07/30/21 1722 07/31/21 0310 08/01/21 0422 08/01/21 1716 08/01/21 1727 08/02/21 0439 08/03/21 0537  WBC 6.5 7.3 6.8  --   --  7.5 8.3  NEUTROABS 5.2  --   --   --   --   --   --   HGB 15.0 15.1 14.2 16.3 15.6  16.3 13.4 12.4*  HCT 41.7 41.9 40.0 48.0 46.0  48.0 38.0* 34.9*  MCV 89.9 90.3 88.9  --   --  89.8 88.1  PLT PLATELET CLUMPS NOTED ON SMEAR, COUNT APPEARS  DECREASED 43* 48*  --   --  58* 65*    Medications:     Chlorhexidine Gluconate Cloth  6 each Topical Daily   feeding supplement  237 mL Oral BID BM   insulin aspart  0-6 Units Subcutaneous TID WC   lactulose  20 g Oral BID   midodrine  10 mg Oral TID WC   multivitamin with minerals  1 tablet Oral Daily   octreotide  100 mcg Subcutaneous TID   potassium chloride  40 mEq Oral BID   sodium chloride flush  3 mL Intravenous Q12H   sodium chloride flush  3 mL Intravenous Q12H   sodium chloride flush  3 mL Intravenous Q12H   spironolactone  12.5 mg Oral Daily      Bufford Buttner MD 08/04/2021, 10:11 AM

## 2021-08-05 DIAGNOSIS — B179 Acute viral hepatitis, unspecified: Secondary | ICD-10-CM | POA: Diagnosis not present

## 2021-08-05 DIAGNOSIS — I5082 Biventricular heart failure: Secondary | ICD-10-CM

## 2021-08-05 DIAGNOSIS — I50812 Chronic right heart failure: Secondary | ICD-10-CM | POA: Diagnosis not present

## 2021-08-05 DIAGNOSIS — N179 Acute kidney failure, unspecified: Secondary | ICD-10-CM | POA: Diagnosis not present

## 2021-08-05 DIAGNOSIS — I5022 Chronic systolic (congestive) heart failure: Secondary | ICD-10-CM | POA: Diagnosis not present

## 2021-08-05 LAB — COMPREHENSIVE METABOLIC PANEL
ALT: 180 U/L — ABNORMAL HIGH (ref 0–44)
AST: 119 U/L — ABNORMAL HIGH (ref 15–41)
Albumin: 2.4 g/dL — ABNORMAL LOW (ref 3.5–5.0)
Alkaline Phosphatase: 83 U/L (ref 38–126)
Anion gap: 10 (ref 5–15)
BUN: 27 mg/dL — ABNORMAL HIGH (ref 8–23)
CO2: 28 mmol/L (ref 22–32)
Calcium: 7.3 mg/dL — ABNORMAL LOW (ref 8.9–10.3)
Chloride: 85 mmol/L — ABNORMAL LOW (ref 98–111)
Creatinine, Ser: 0.97 mg/dL (ref 0.61–1.24)
GFR, Estimated: >60 mL/min (ref >60)
Glucose, Bld: 87 mg/dL (ref 70–99)
Potassium: 3.8 mmol/L (ref 3.5–5.1)
Sodium: 123 mmol/L — ABNORMAL LOW (ref 135–145)
Total Bilirubin: 26.1 mg/dL (ref 0.3–1.2)
Total Protein: 5.4 g/dL — ABNORMAL LOW (ref 6.5–8.1)

## 2021-08-05 LAB — CBC
HCT: 31.5 % — ABNORMAL LOW (ref 39.0–52.0)
Hemoglobin: 11.2 g/dL — ABNORMAL LOW (ref 13.0–17.0)
MCH: 31.2 pg (ref 26.0–34.0)
MCHC: 35.6 g/dL (ref 30.0–36.0)
MCV: 87.7 fL (ref 80.0–100.0)
Platelets: 75 10*3/uL — ABNORMAL LOW (ref 150–400)
RBC: 3.59 MIL/uL — ABNORMAL LOW (ref 4.22–5.81)
RDW: 19.1 % — ABNORMAL HIGH (ref 11.5–15.5)
WBC: 8.2 10*3/uL (ref 4.0–10.5)
nRBC: 0 % (ref 0.0–0.2)

## 2021-08-05 LAB — COOXEMETRY PANEL
Carboxyhemoglobin: 2.3 % — ABNORMAL HIGH (ref 0.5–1.5)
Methemoglobin: <0.7 % (ref 0.0–1.5)
O2 Saturation: 79.6 %
Total hemoglobin: 12.2 g/dL (ref 12.0–16.0)

## 2021-08-05 LAB — GLUCOSE, CAPILLARY
Glucose-Capillary: 94 mg/dL (ref 70–99)
Glucose-Capillary: 92 mg/dL (ref 70–99)
Glucose-Capillary: 84 mg/dL (ref 70–99)
Glucose-Capillary: 128 mg/dL — ABNORMAL HIGH (ref 70–99)

## 2021-08-05 LAB — MAGNESIUM: Magnesium: 2.3 mg/dL (ref 1.7–2.4)

## 2021-08-05 MED ORDER — POTASSIUM CHLORIDE CRYS ER 20 MEQ PO TBCR
40.0000 meq | EXTENDED_RELEASE_TABLET | Freq: Three times a day (TID) | ORAL | Status: DC
  Administered 2021-08-05 (×3): 40 meq via ORAL
  Filled 2021-08-05 (×3): qty 2

## 2021-08-05 MED ORDER — DIGOXIN 125 MCG PO TABS
0.1250 mg | ORAL_TABLET | Freq: Every day | ORAL | Status: DC
  Administered 2021-08-05 – 2021-08-16 (×12): 0.125 mg via ORAL
  Filled 2021-08-05 (×12): qty 1

## 2021-08-05 MED ORDER — FUROSEMIDE 10 MG/ML IJ SOLN
12.0000 mg/h | INTRAVENOUS | Status: DC
  Administered 2021-08-05 – 2021-08-07 (×3): 12 mg/h via INTRAVENOUS
  Filled 2021-08-05 (×4): qty 20

## 2021-08-05 NOTE — Progress Notes (Addendum)
Advanced Heart Failure Rounding Note  PCP-Cardiologist: Nicki Guadalajara, MD   Subjective:    RHC  6/22  Hepatic wedge = 26 RA = 22 RV = 57/24  PA = 55/29 (38) PCW = 26 (v = 34) Fick cardiac output/index = 3.4/1.8 Therm CO/CI = 2.1/1.0 PVR = 3.2 WU FA sat = 97% PA sat = 59%, 59%  PAPi = 1.2  Assessment: 1. Severe biventricular failure with low output 2. 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   CO-OX 80% on 0.25 milrinone.   CVP 12.  Continues on lasix gtt at 8/hr. 2.5L UOP charted yesterday. ? Weight down another 6 lb  Looks like may have gone into Afib yesterday around 11 am, rates 90s-100s.  SBP 90s/50s  Scr stable 0.97.   Total bili remains elevated at 26.   Dyspnea improving. No CP. Doesn't have great appetite.   Objective:   Weight Range: 73.7 kg Body mass index is 20.86 kg/m.   Vital Signs:   Temp:  [97.4 F (36.3 C)-98 F (36.7 C)] 97.8 F (36.6 C) (06/26 0430) Pulse Rate:  [84-102] 100 (06/26 0430) Resp:  [16-20] 17 (06/26 0430) BP: (84-98)/(44-64) 92/64 (06/26 0430) SpO2:  [93 %-97 %] 93 % (06/26 0430) Weight:  [73.7 kg] 73.7 kg (06/26 0500) Last BM Date : 08/03/21  Weight change: Filed Weights   08/03/21 0447 08/04/21 0530 08/05/21 0500  Weight: 74.6 kg 76.6 kg 73.7 kg    Intake/Output:   Intake/Output Summary (Last 24 hours) at 08/05/2021 0732 Last data filed at 08/05/2021 0430 Gross per 24 hour  Intake 1236.83 ml  Output 2550 ml  Net -1313.17 ml      Physical Exam    General: No distress. Lying comfortably in bed. HEENT: normal Neck: supple. JVP 12 cm. Carotids 2+ bilat; no bruits. L IJ CVC. Cor: PMI nondisplaced. Irregular rate & rhythm. No rubs, gallops or murmurs. Lungs: decreased in bases Abdomen: soft, nontender, + distended.  Extremities: no cyanosis, clubbing, rash, edema Neuro: alert & orientedx3, cranial nerves grossly intact. moves all 4 extremities w/o  difficulty. Affect pleasant   Telemetry   ? Atrial fibrillation 90s-100s with PVCs < 5/min (personally reviewed)   Labs    CBC Recent Labs    08/03/21 0537  WBC 8.3  HGB 12.4*  HCT 34.9*  MCV 88.1  PLT 65*   Basic Metabolic Panel Recent Labs    95/62/13 0323 08/05/21 0436  NA 122* 123*  K 3.9 3.8  CL 81* 85*  CO2 27 28  GLUCOSE 85 87  BUN 39* 27*  CREATININE 1.19 0.97  CALCIUM 7.8* 7.3*  MG 2.4 2.3   Liver Function Tests Recent Labs    08/04/21 0323 08/05/21 0436  AST 116* 119*  ALT 214* 180*  ALKPHOS 80 83  BILITOT 26.2* 26.1*  PROT 5.9* 5.4*  ALBUMIN 2.5* 2.4*   No results for input(s): "LIPASE", "AMYLASE" in the last 72 hours.  Cardiac Enzymes No results for input(s): "CKTOTAL", "CKMB", "CKMBINDEX", "TROPONINI" in the last 72 hours.  BNP: BNP (last 3 results) Recent Labs    02/01/21 1522 02/13/21 1709  BNP 2,411.9* 1,831.1*    ProBNP (last 3 results) No results for input(s): "PROBNP" in the last 8760 hours.   D-Dimer No results for input(s): "DDIMER" in the last 72 hours. Hemoglobin A1C No results for input(s): "HGBA1C" in the last 72 hours. Fasting Lipid Panel No results for input(s): "CHOL", "HDL", "LDLCALC", "  TRIG", "CHOLHDL", "LDLDIRECT" in the last 72 hours. Thyroid Function Tests No results for input(s): "TSH", "T4TOTAL", "T3FREE", "THYROIDAB" in the last 72 hours.  Invalid input(s): "FREET3"  Other results:   Imaging    No results found.   Medications:     Scheduled Medications:  Chlorhexidine Gluconate Cloth  6 each Topical Daily   feeding supplement  237 mL Oral BID BM   insulin aspart  0-6 Units Subcutaneous TID WC   lactulose  20 g Oral BID   midodrine  10 mg Oral TID WC   multivitamin with minerals  1 tablet Oral Daily   octreotide  100 mcg Subcutaneous TID   potassium chloride  40 mEq Oral BID   sodium chloride flush  3 mL Intravenous Q12H   sodium chloride flush  3 mL Intravenous Q12H   sodium chloride  flush  3 mL Intravenous Q12H   spironolactone  12.5 mg Oral Daily    Infusions:  sodium chloride     sodium chloride     furosemide (LASIX) 200 mg in dextrose 5 % 100 mL (2 mg/mL) infusion 8 mg/hr (08/05/21 0300)   milrinone 0.25 mcg/kg/min (08/05/21 0300)    PRN Medications: sodium chloride, sodium chloride, acetaminophen, albuterol, guaiFENesin, hydrALAZINE, ipratropium-albuterol, ondansetron (ZOFRAN) IV, mouth rinse, senna-docusate, sodium chloride flush, sodium chloride flush, traZODone    Patient Profile    66 y.o. male with history of chronic biventricular HF/NICM, PVCs/NSVT, COPD, hx pleural effusion s/p right thoracentesis 01/23, DM II. Admitted with progressive weakness in setting of elevated LFTs, hyponatremia, AKI and concern for a/c biventricular HF with low-output   Assessment/Plan   1. Chronic Biventricular HFrEF/ NICM - Echo (1/23): severely reduced EF < 20% and severely reduced RV. Moderate to severe MR.  - L/RHC (1/23): moderate non-obstructive CAD. EF < 10% - cMRI (1/23): LVEF 10%, severe biventricular heart failure, basal septal wall mid-wall LGE, apical inferior subendocardial LGE, and RV insertion non specific scar pattern.  - CT chest (1/23): without evidence for pulmonary sarcoidosis.   - Myeloma panel and SPEP/UPEP negative. ACE level OK (37). - Etiology for CM => isolated cardiac sarcoidosis vs prior myocarditis vs genetic cardiomyopathy, +/- PVCs - Cardiac PET previously set up at [facility], has not completed. Genetic testing sent 03/04/21. - Echo 05/30/21 EF 20-25% RV severely reduced.  - Referred to EP for ICD in April - has not scheduled appointment.  - Had cath 08/01/21 with low output and elevated filling pressures. - On milrinone. Co-ox improved 77%. Diuresing with IV lasix gtt at 8/hr. CVP a little higher at 12 today.  Increase lasix gtt to 12/hr. - Looks like now in AF rate 90s-100s. - Rv being decongested but no improvement in liver function.  - GDMT  limited by hypotension. SBP 90s. Continue midodrine 10 TID.  2. ? Atrial fibrillation - Looks like went into AF late yesterday am - Rate 90s-100s - Limited rhythm control options. Will hold off on amiodarone at this point with liver failure - Digoxin stopped in past d/t elevated level. ? Retrial. - Not sure he is a candidate for anticoagulation. Platelets 40s-60s this admit in setting of liver failure. Last CBC a couple of days ago. Check today.  3. Acute liver failure - US abdomen R pleural effusion, + ascites, increased echogenicity of liver, large gallstone with gallbladder sludge. - CT abdomen pelvis - bilateral pleural effusions, small ascites, anasarca, + gallstone - MR abdomen - no focal liver abnormality, cholelithiasis without biliary dilatation, moderate to  large b/l pleural effusions, mesenteric edema with ascites, diffuse body wall edema - Hepatitis panel negative. Other serologies pending. - [facility] following.  - Total Bili 24.6>24.9> 25.1> 26.2 > 26.1.   4. Hyponatremia - Hypervolemic hyponatremia - Na 120>121>122 >123 today  - Not a good candidate for Tolvaptan with liver dysfunction - Nephrology following  - Continue RV support with milrinone and IV diuresis    5. AKI: - Scr baseline ~ 1, this admit 1.6>1.3 > 1.22 > 1.19 > 0.97 today  - Midodrine to support BP   6. PVCs/NSVT - >20% PVC burden during admit 01/23 - Rare PVCs today. - Will hold off on restarting amiodarone and mexiletine for now with acute liver failure - No OSA on sleep study 2/23.   7. COPD - Former heavy smoker. Quit smoking 01/07/21.   8. H/o Pleural Effusion - s/p rt thoracentesis 1/23 w/ 1200 cc fluid removal. - moderate to large b/l pleural effusions on MR this admit - ? Thoracentesis if not improving with diuresis    Overall remains critically ill with end-stage biventricular HF and liver failure. Fulminant liver failure seems out of proportion to what we see with even end-stage HF  (which he has) and there has been no meaningful improvement in hepatic indices despite > 48 hours of aggressive cardiac support with successful decongestion of RV and liver. 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 He is not candidate for advanced cardiac or liver therapies with his combined organ failure.   Discussed code status over the weekend and he reiterated he wanted to be full code. He is aware of potential for prolonged code and bad outcomes.   Worry he has limited insight into the severity of his overall condition. Palliative care continuing to follow for GOC discussion. Appreciate assistance.   Length of Stay: 6  FINCH, LINDSAY N, PA-C  08/05/2021, 7:32 AM  Advanced Heart Failure Team Pager [phone] (M-F; 7a - 5p)  Please contact [facility] for night-coverage after hours (5p -7a ) and weekends on [email or web address]  Agree with above. Remains on milrinone and IV lasix. Good diuresis. Went into AF yesterday.   Bili unchanged at 26.   General:  Jaundiced. Weak appearing HEENT: normal + scleral icterus Neck: supple. JVP to jaw Carotids 2+ bilat; no bruits. No lymphadenopathy or thryomegaly appreciated. Cor: PMI nondisplaced. Irregular tachy  Lungs: clear Abdomen: soft, nontender, nondistended. No hepatosplenomegaly. No bruits or masses. Good bowel sounds. Extremities: no cyanosis, clubbing, rash, edema Neuro: alert & orientedx3, cranial nerves grossly intact. moves all 4 extremities w/o difficulty. Affect pleasant  No improvement in liver function with aggressive cardiac support. Co-ox now high. I worry about the development of high output HF. Will cut milrinone to 0.125. Continue diuresis for one more day or so. Unable to add amio or anticoagulate for AF currently with severe liver failure. Wil ladd dig for rate control. Will discuss next steps with GI. Not candidate for advanced HF therapies (including heart/liver transplant).   CRITICAL CARE Performed by: Arvilla Meres  Total critical care time: 35 minutes  Critical care time was exclusive of separately billable procedures and treating other patients.  Critical care was necessary to treat or prevent imminent or life-threatening deterioration.  Critical care was time spent personally by me (independent of midlevel providers or residents) on the following activities: development of treatment plan with patient and/or surrogate as well as nursing, discussions with consultants, evaluation of patient's response to treatment, examination of patient, obtaining history  from patient or surrogate, ordering and performing treatments and interventions, ordering and review of laboratory studies, ordering and review of radiographic studies, pulse oximetry and re-evaluation of patient's condition.   Arvilla Meres, MD  9:22 AM

## 2021-08-06 DIAGNOSIS — I5022 Chronic systolic (congestive) heart failure: Secondary | ICD-10-CM | POA: Diagnosis not present

## 2021-08-06 DIAGNOSIS — I5082 Biventricular heart failure: Secondary | ICD-10-CM | POA: Diagnosis not present

## 2021-08-06 DIAGNOSIS — B179 Acute viral hepatitis, unspecified: Secondary | ICD-10-CM | POA: Diagnosis not present

## 2021-08-06 DIAGNOSIS — N179 Acute kidney failure, unspecified: Secondary | ICD-10-CM | POA: Diagnosis not present

## 2021-08-06 DIAGNOSIS — I50812 Chronic right heart failure: Secondary | ICD-10-CM | POA: Diagnosis not present

## 2021-08-06 LAB — COMPREHENSIVE METABOLIC PANEL
ALT: 187 U/L — ABNORMAL HIGH (ref 0–44)
AST: 144 U/L — ABNORMAL HIGH (ref 15–41)
Albumin: 2.5 g/dL — ABNORMAL LOW (ref 3.5–5.0)
Alkaline Phosphatase: 107 U/L (ref 38–126)
Anion gap: 10 (ref 5–15)
BUN: 28 mg/dL — ABNORMAL HIGH (ref 8–23)
CO2: 29 mmol/L (ref 22–32)
Calcium: 8 mg/dL — ABNORMAL LOW (ref 8.9–10.3)
Chloride: 82 mmol/L — ABNORMAL LOW (ref 98–111)
Creatinine, Ser: 0.98 mg/dL (ref 0.61–1.24)
GFR, Estimated: >60 mL/min (ref >60)
Glucose, Bld: 95 mg/dL (ref 70–99)
Potassium: 4.3 mmol/L (ref 3.5–5.1)
Sodium: 121 mmol/L — ABNORMAL LOW (ref 135–145)
Total Bilirubin: 29.2 mg/dL (ref 0.3–1.2)
Total Protein: 6 g/dL — ABNORMAL LOW (ref 6.5–8.1)

## 2021-08-06 LAB — CBC
HCT: 33.7 % — ABNORMAL LOW (ref 39.0–52.0)
Hemoglobin: 12.1 g/dL — ABNORMAL LOW (ref 13.0–17.0)
MCH: 31.7 pg (ref 26.0–34.0)
MCHC: 35.9 g/dL (ref 30.0–36.0)
MCV: 88.2 fL (ref 80.0–100.0)
Platelets: 85 10*3/uL — ABNORMAL LOW (ref 150–400)
RBC: 3.82 MIL/uL — ABNORMAL LOW (ref 4.22–5.81)
RDW: 19.3 % — ABNORMAL HIGH (ref 11.5–15.5)
WBC: 8.8 10*3/uL (ref 4.0–10.5)
nRBC: 0 % (ref 0.0–0.2)

## 2021-08-06 LAB — COOXEMETRY PANEL
Carboxyhemoglobin: 3 % — ABNORMAL HIGH (ref 0.5–1.5)
Methemoglobin: <0.7 % (ref 0.0–1.5)
O2 Saturation: 68.3 %
Total hemoglobin: 12 g/dL (ref 12.0–16.0)

## 2021-08-06 LAB — GLUCOSE, CAPILLARY
Glucose-Capillary: 95 mg/dL (ref 70–99)
Glucose-Capillary: 90 mg/dL (ref 70–99)
Glucose-Capillary: 88 mg/dL (ref 70–99)
Glucose-Capillary: 93 mg/dL (ref 70–99)

## 2021-08-06 LAB — MAGNESIUM: Magnesium: 2.3 mg/dL (ref 1.7–2.4)

## 2021-08-06 MED ORDER — POTASSIUM CHLORIDE CRYS ER 20 MEQ PO TBCR
40.0000 meq | EXTENDED_RELEASE_TABLET | Freq: Two times a day (BID) | ORAL | Status: DC
  Administered 2021-08-06 – 2021-08-08 (×6): 40 meq via ORAL
  Filled 2021-08-06 (×6): qty 2

## 2021-08-06 NOTE — Progress Notes (Signed)
[facility] Progress Note  Erik Chan 66 y.o. Nov 28, 1955  CC: Abnormal LFTs   Subjective: Patient seen and examined at bedside.   complaining of fatigue and weakness and wants to rest.  ROS : Afebrile, negative for chest pain   Objective: Vital signs in last 24 hours: Vitals:   08/06/21 0751 08/06/21 1141  BP: 96/60 (!) 93/59  Pulse: 93 97  Resp: 14 (!) 21  Temp: 98 F (36.7 C) 98.1 F (36.7 C)  SpO2: 90% 92%    Physical Exam:  General:  Alert, cooperative, no distress, appears stated age  Head:  Normocephalic, without obvious abnormality, atraumatic  Eyes:  , EOM's intact,   Lungs:   Clear to auscultation bilaterally, respirations unlabored  Heart:  Regular rate and rhythm, S1, S2 normal  Abdomen:   Soft, non-tender, minimal abdominal distention, bowel sounds present, no peritoneal sign          Lab Results: Recent Labs    08/05/21 0436 08/06/21 0517  NA 123* 121*  K 3.8 4.3  CL 85* 82*  CO2 28 29  GLUCOSE 87 95  BUN 27* 28*  CREATININE 0.97 0.98  CALCIUM 7.3* 8.0*  MG 2.3 2.3   Recent Labs    08/05/21 0436 08/06/21 0517  AST 119* 144*  ALT 180* 187*  ALKPHOS 83 107  BILITOT 26.1* 29.2*  PROT 5.4* 6.0*  ALBUMIN 2.4* 2.5*   Recent Labs    08/05/21 0755 08/06/21 0517  WBC 8.2 8.8  HGB 11.2* 12.1*  HCT 31.5* 33.7*  MCV 87.7 88.2  PLT 75* 85*   No results for input(s): "LABPROT", "INR" in the last 72 hours.    Assessment/Plan: -Abnormal LFTs.  Patient with mild elevated LFTs since December 2022 but not having significant jaundice.  Work-up negative so far except for significantly elevated ferritin which could be from acute phase reactant. -Negative hepatitis panel.  Normal ANA and ASMA.  Mildly elevated ceruloplasmin.  Normal iron saturation.  Elevated ferritin.  Normal alpha-1 antitrypsin level.  Negative EBV PCR.  -Severe biventricular failure with low EF of 20 %.  Probably congestive  hepatopathy  Recommendation ----------------------- -HSV PCR, hemochromatosis gene evaluation and AMA pending -Most likely etiology is congestive hepatopathy and most likely his liver tests will not improve without improvement in cardiac function.  -No improvement in LFTs and T. bili is trending upwards.   -Option for liver biopsy discussed with the patient but patient does not want to have it done today as he wants to rest.  -I will discuss with him again tomorrow   Kathi Der MD, FACP 08/06/2021, 1:27 PM  Contact #  [phone]

## 2021-08-07 ENCOUNTER — Encounter (HOSPITAL_COMMUNITY): Payer: Self-pay | Admitting: Family Medicine

## 2021-08-07 DIAGNOSIS — I480 Paroxysmal atrial fibrillation: Secondary | ICD-10-CM

## 2021-08-07 DIAGNOSIS — K7200 Acute and subacute hepatic failure without coma: Secondary | ICD-10-CM

## 2021-08-07 DIAGNOSIS — I5082 Biventricular heart failure: Secondary | ICD-10-CM | POA: Diagnosis not present

## 2021-08-07 DIAGNOSIS — I4729 Other ventricular tachycardia: Secondary | ICD-10-CM

## 2021-08-07 DIAGNOSIS — B179 Acute viral hepatitis, unspecified: Secondary | ICD-10-CM | POA: Diagnosis not present

## 2021-08-07 DIAGNOSIS — I5041 Acute combined systolic (congestive) and diastolic (congestive) heart failure: Secondary | ICD-10-CM

## 2021-08-07 DIAGNOSIS — I5022 Chronic systolic (congestive) heart failure: Secondary | ICD-10-CM | POA: Diagnosis not present

## 2021-08-07 LAB — GLUCOSE, CAPILLARY
Glucose-Capillary: 90 mg/dL (ref 70–99)
Glucose-Capillary: 83 mg/dL (ref 70–99)
Glucose-Capillary: 123 mg/dL — ABNORMAL HIGH (ref 70–99)
Glucose-Capillary: 100 mg/dL — ABNORMAL HIGH (ref 70–99)

## 2021-08-07 LAB — COMPREHENSIVE METABOLIC PANEL
ALT: 180 U/L — ABNORMAL HIGH (ref 0–44)
AST: 157 U/L — ABNORMAL HIGH (ref 15–41)
Albumin: 2.5 g/dL — ABNORMAL LOW (ref 3.5–5.0)
Alkaline Phosphatase: 151 U/L — ABNORMAL HIGH (ref 38–126)
Anion gap: 12 (ref 5–15)
BUN: 25 mg/dL — ABNORMAL HIGH (ref 8–23)
CO2: 31 mmol/L (ref 22–32)
Calcium: 8.2 mg/dL — ABNORMAL LOW (ref 8.9–10.3)
Chloride: 79 mmol/L — ABNORMAL LOW (ref 98–111)
Creatinine, Ser: 0.92 mg/dL (ref 0.61–1.24)
GFR, Estimated: >60 mL/min (ref >60)
Glucose, Bld: 87 mg/dL (ref 70–99)
Potassium: 4.2 mmol/L (ref 3.5–5.1)
Sodium: 122 mmol/L — ABNORMAL LOW (ref 135–145)
Total Bilirubin: 30.1 mg/dL (ref 0.3–1.2)
Total Protein: 6 g/dL — ABNORMAL LOW (ref 6.5–8.1)

## 2021-08-07 LAB — MAGNESIUM: Magnesium: 2.3 mg/dL (ref 1.7–2.4)

## 2021-08-07 LAB — COOXEMETRY PANEL
Carboxyhemoglobin: 1.9 % — ABNORMAL HIGH (ref 0.5–1.5)
Methemoglobin: 0.9 % (ref 0.0–1.5)
O2 Saturation: 67.4 %
Total hemoglobin: 13 g/dL (ref 12.0–16.0)

## 2021-08-07 MED ORDER — TRAZODONE HCL 50 MG PO TABS
50.0000 mg | ORAL_TABLET | Freq: Every evening | ORAL | Status: DC | PRN
  Administered 2021-08-07 – 2021-08-15 (×7): 50 mg via ORAL
  Filled 2021-08-07 (×7): qty 1

## 2021-08-07 MED ORDER — TRAZODONE HCL 50 MG PO TABS: 50.0000 mg | ORAL_TABLET | Freq: Every day | ORAL | Status: DC

## 2021-08-07 NOTE — Assessment & Plan Note (Signed)
No si/sx exacerbation

## 2021-08-07 NOTE — Assessment & Plan Note (Signed)
noted 

## 2021-08-07 NOTE — Progress Notes (Signed)
[facility] Gastroenterology Progress Note  Erik Chan 66 y.o. Mar 11, 1955  CC:  Abnormal LFTs   Subjective: Patient seen and examined at bedside.  Feeling a little better today though having some discomfort from laying in bed.  Denies nausea, vomiting, fever, chills, abdominal pain.  Reports he is having bowel movements.  Tolerating diet.  Dark urine in canister.  States he is open to having liver biopsy if needed.  ROS : Review of Systems  Constitutional:  Negative for chills and fever.  Gastrointestinal:  Negative for abdominal pain, blood in stool, constipation, heartburn, melena, nausea and vomiting.  Genitourinary:  Negative for dysuria.      Objective: Vital signs in last 24 hours: Vitals:   08/07/21 0346 08/07/21 0803  BP: 94/69 90/66  Pulse: 99 99  Resp: 19 20  Temp: 98 F (36.7 C) 97.8 F (36.6 C)  SpO2: 92% 90%    Physical Exam:  General:  Alert, cooperative, no distress, appears stated age  Head:  Normocephalic, without obvious abnormality, atraumatic  Eyes:  EOM's intact  Lungs:   Clear to auscultation bilaterally, respirations unlabored  Heart:  Regular rate and rhythm, S1, S2 normal  Abdomen:   Soft, non-tender, bowel sounds active all four quadrants,  no masses          Lab Results: Recent Labs    08/06/21 0517 08/07/21 0353  NA 121* 122*  K 4.3 4.2  CL 82* 79*  CO2 29 31  GLUCOSE 95 87  BUN 28* 25*  CREATININE 0.98 0.92  CALCIUM 8.0* 8.2*  MG 2.3 2.3   Recent Labs    08/06/21 0517 08/07/21 0353  AST 144* 157*  ALT 187* 180*  ALKPHOS 107 151*  BILITOT 29.2* 30.1*  PROT 6.0* 6.0*  ALBUMIN 2.5* 2.5*   Recent Labs    08/05/21 0755 08/06/21 0517  WBC 8.2 8.8  HGB 11.2* 12.1*  HCT 31.5* 33.7*  MCV 87.7 88.2  PLT 75* 85*   No results for input(s): "LABPROT", "INR" in the last 72 hours.    Assessment Abnormal LFTs - Patient with mild elevated LFTs since December 2022 - Negative hepatitis panel.  Normal ANA and ASMA.  Mildly  elevated ceruloplasmin.  Normal iron saturation.  Elevated ferritin.  Normal alpha-1 antitrypsin level.  Negative EBV PCR. - No improvement in LFTs and T. bili is trending upwards. - Biventricular failure with low EF of 20%.  Probably congestive hepatopathy.  Plan: HSV PCR, hemochromatosis gene evaluation, and AMA pending.  Most likely etiology is congestive hepatopathy and liver tests may not improve without improvement in cardiac function. Patient open to having liver biopsy.  Will discuss further with Dr. Levora Angel. Continue to trend LFTs. Continue supportive care.  [facility] will follow.  Berdine Dance PA-C 08/07/2021, 9:31 AM  Contact #  [phone]

## 2021-08-07 NOTE — Assessment & Plan Note (Signed)
Amiodarone and mexiletine on hold with liver failure

## 2021-08-07 NOTE — Assessment & Plan Note (Signed)
CT abd/pelvis with bilateral pleural effusions, small ascites, anasarca Suspected related to volume overload with HF

## 2021-08-07 NOTE — Assessment & Plan Note (Addendum)
A1c 02/2021 6.7 BG's on lower side - hold SSI, follow BG's Home jardiance on hold

## 2021-08-07 NOTE — Progress Notes (Signed)
Advanced Heart Failure Rounding Note  PCP-Cardiologist: Nicki Guadalajara, MD   Subjective:     06/26: Milrinone cut back to 0.125 mcg   CO-OX stable at 67% on milrinone 0.125  Scr stable 0.92  Total bili continues to rise  26.>29.2>30  Has diuresed well. Denies SOB, orthopnea or PND. Remains in AF   RHC  6/22  Hepatic wedge = 26 RA = 22 RV = 57/24  PA = 55/29 (38) PCW = 26 (v = 34) Fick cardiac output/index = 3.4/1.8 Therm CO/CI = 2.1/1.0 PVR = 3.2 WU FA sat = 97% PA sat = 59%, 59%  PAPi = 1.2  Assessment: 1. Severe biventricular failure with low output 2. 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    Objective:   Weight Range: 74.4 kg Body mass index is 21.06 kg/m.   Vital Signs:   Temp:  [97.8 F (36.6 C)-98.2 F (36.8 C)] 97.8 F (36.6 C) (06/28 0803) Pulse Rate:  [76-99] 99 (06/28 0803) Resp:  [19-21] 20 (06/28 0803) BP: (90-105)/(59-69) 90/66 (06/28 0803) SpO2:  [90 %-92 %] 90 % (06/28 0803) Weight:  [74.4 kg] 74.4 kg (06/28 0346) Last BM Date : 08/03/21  Weight change: Filed Weights   08/05/21 0500 08/06/21 0515 08/07/21 0346  Weight: 73.7 kg 74 kg 74.4 kg    Intake/Output:   Intake/Output Summary (Last 24 hours) at 08/07/2021 0915 Last data filed at 08/07/2021 0346 Gross per 24 hour  Intake 693.33 ml  Output 1950 ml  Net -1256.67 ml       Physical Exam   General: Profoundly jaundiced No resp difficulty HEENT: normal + scleral icterus Neck: supple. no JVD. Carotids 2+ bilat; no bruits. No lymphadenopathy or thryomegaly appreciated. Cor: PMI nondisplaced. Regular rate & rhythm. No rubs, gallops or murmurs. Lungs: clear Abdomen: soft, nontender, nondistended. No hepatosplenomegaly. No bruits or masses. Good bowel sounds. Extremities: no cyanosis, clubbing, rash, edema Neuro: alert & orientedx3, cranial nerves grossly intact. moves all 4 extremities w/o difficulty. Affect  pleasant   Telemetry   AF 95-100 Personally reviewed   Labs    CBC Recent Labs    08/05/21 0755 08/06/21 0517  WBC 8.2 8.8  HGB 11.2* 12.1*  HCT 31.5* 33.7*  MCV 87.7 88.2  PLT 75* 85*    Basic Metabolic Panel Recent Labs    27/06/23 0517 08/07/21 0353  NA 121* 122*  K 4.3 4.2  CL 82* 79*  CO2 29 31  GLUCOSE 95 87  BUN 28* 25*  CREATININE 0.98 0.92  CALCIUM 8.0* 8.2*  MG 2.3 2.3    Liver Function Tests Recent Labs    08/06/21 0517 08/07/21 0353  AST 144* 157*  ALT 187* 180*  ALKPHOS 107 151*  BILITOT 29.2* 30.1*  PROT 6.0* 6.0*  ALBUMIN 2.5* 2.5*    No results for input(s): "LIPASE", "AMYLASE" in the last 72 hours.  Cardiac Enzymes No results for input(s): "CKTOTAL", "CKMB", "CKMBINDEX", "TROPONINI" in the last 72 hours.  BNP: BNP (last 3 results) Recent Labs    02/01/21 1522 02/13/21 1709  BNP 2,411.9* 1,831.1*     ProBNP (last 3 results) No results for input(s): "PROBNP" in the last 8760 hours.   D-Dimer No results for input(s): "DDIMER" in the last 72 hours. Hemoglobin A1C No results for input(s): "HGBA1C" in the last 72 hours. Fasting Lipid Panel No results for input(s): "CHOL", "HDL", "LDLCALC", "TRIG", "CHOLHDL", "LDLDIRECT" in the last 72 hours. Thyroid Function  Tests No results for input(s): "TSH", "T4TOTAL", "T3FREE", "THYROIDAB" in the last 72 hours.  Invalid input(s): "FREET3"  Other results:   Imaging    No results found.   Medications:     Scheduled Medications:  Chlorhexidine Gluconate Cloth  6 each Topical Daily   digoxin  0.125 mg Oral Daily   feeding supplement  237 mL Oral BID BM   insulin aspart  0-6 Units Subcutaneous TID WC   lactulose  20 g Oral BID   midodrine  10 mg Oral TID WC   multivitamin with minerals  1 tablet Oral Daily   octreotide  100 mcg Subcutaneous TID   potassium chloride  40 mEq Oral BID   sodium chloride flush  3 mL Intravenous Q12H   sodium chloride flush  3 mL Intravenous  Q12H   sodium chloride flush  3 mL Intravenous Q12H   spironolactone  12.5 mg Oral Daily    Infusions:  sodium chloride     sodium chloride     furosemide (LASIX) 200 mg in dextrose 5 % 100 mL (2 mg/mL) infusion 12 mg/hr (08/07/21 0300)   milrinone 0.125 mcg/kg/min (08/07/21 0356)    PRN Medications: sodium chloride, sodium chloride, acetaminophen, albuterol, guaiFENesin, hydrALAZINE, ipratropium-albuterol, ondansetron (ZOFRAN) IV, mouth rinse, senna-docusate, sodium chloride flush, sodium chloride flush, traZODone    Patient Profile    66 y.o. male with history of chronic biventricular HF/NICM, PVCs/NSVT, COPD, hx pleural effusion s/p right thoracentesis 01/23, DM II. Admitted with progressive weakness in setting of elevated LFTs, hyponatremia, AKI and concern for a/c biventricular HF with low-output   Assessment/Plan   1. Chronic Biventricular HFrEF/ NICM - Echo (1/23): severely reduced EF < 20% and severely reduced RV. Moderate to severe MR.  - L/RHC (1/23): moderate non-obstructive CAD. EF < 10% - cMRI (1/23): LVEF 10%, severe biventricular heart failure, basal septal wall mid-wall LGE, apical inferior subendocardial LGE, and RV insertion non specific scar pattern.  - CT chest (1/23): without evidence for pulmonary sarcoidosis.   - Myeloma panel and SPEP/UPEP negative. ACE level OK (37). - Etiology for CM => isolated cardiac sarcoidosis vs prior myocarditis vs genetic cardiomyopathy, +/- PVCs - Cardiac PET previously set up at [facility], has not completed. Genetic testing sent 03/04/21. - Echo 05/30/21 EF 20-25% RV severely reduced.  - Referred to EP for ICD in April - has not scheduled appointment.  - RHC 08/01/21 with low output and elevated filling pressures. - CO-OX stable on milrinone 0.125 mcg.  - CVP down. Will switch to po diuretics - Rv being decongested but no improvement in liver function.  - SBP 90-100. Continue midodrine 10 TID.  2. ? Atrial fibrillation - Rate  90s-100s - Limited rhythm control options. No amiodarone at this point with liver failure - No change   3. Acute liver failure - US abdomen R pleural effusion, + ascites, increased echogenicity of liver, large gallstone with gallbladder sludge. - CT abdomen pelvis - bilateral pleural effusions, small ascites, anasarca, + gallstone - MR abdomen - no focal liver abnormality, cholelithiasis without biliary dilatation, moderate to large b/l pleural effusions, mesenteric edema with ascites, diffuse body wall edema - Hepatitis panel negative. Other serologies pending. - [facility] following.  - Total Bili 24.6>24.9> 25.1> 26.2 > 26.1>29>30   4. Hyponatremia - Hypervolemic hyponatremia - Na 120>121>122 >123 >121 > 122 today  - Not a good candidate for Tolvaptan with liver dysfunction - Nephrology following  - Continue RV support with milrinone and IV  diuresis    5. AKI: - Scr baseline ~ 1, this admit 1.6>1.3 > 1.22 > 1.19 > 0.97 >0.98 > 0.92 today  - Midodrine to support BP   6. PVCs/NSVT - >20% PVC burden during admit 01/23 - Rare PVCs today. - Will hold off on restarting amiodarone and mexiletine with liver failure - No OSA on sleep study 2/23.   7. COPD - Former heavy smoker. Quit smoking 01/07/21.   8. H/o Pleural Effusion - s/p rt thoracentesis 1/23 w/ 1200 cc fluid removal. 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 No option for heart-liver transplant.   We have optimized his cardiac condition and still no improvement in his liver failure. There is not much more we can do from cardiac standpoint. Prognosis now dependent on his liver disease. Wil defer to GI for decision-making as to next steps. I suspect comfort care is only real option  CRITICAL CARE Performed by: [name]  Total critical care time: 35 minutes  Critical care time was exclusive of separately billable procedures and treating other  patients.  Critical care was necessary to treat or prevent imminent or life-threatening deterioration.  Critical care was time spent personally by me (independent of midlevel providers or residents) on the following activities: development of treatment plan with patient and/or surrogate as well as nursing, discussions with consultants, evaluation of patient's response to treatment, examination of patient, obtaining history from patient or surrogate, ordering and performing treatments and interventions, ordering and review of laboratory studies, ordering and review of radiographic studies, pulse oximetry and re-evaluation of patient's condition.    Length of Stay: [place], MD  08/07/2021, 9:15 AM  Advanced Heart Failure Team Pager [phone] (M-F; 7a - 5p)  Please contact [facility] for night-coverage after hours (5p -7a ) and weekends on [email or web address]

## 2021-08-07 NOTE — Progress Notes (Signed)
Occupational Therapy Treatment Patient Details Name: Erik Chan MRN: [id number] DOB: 1955-07-23 Today's Date: 08/07/2021   History of present illness Pt is a 66 y.o. male admitted 07/30/21 with jaundice, generalized weakness (reports not being able to get up since 07/25/21). 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 S/p RHC 6/22 with biventricular failure, concern for low output. PMH includes HF (EF 20%), NICM, COPD, DM2, fibromyalgia.   OT comments  Patient with incremental progress toward patient focused goals.  Patient is about at the same level of assist for all mobility attempts.  OT can continue efforts in the acute setting, with SNF recommended for post acute rehab to maximize functional status prior to returning home.     Recommendations for follow up therapy are one component of a multi-disciplinary discharge planning process, led by the attending physician.  Recommendations may be updated based on patient status, additional functional criteria and insurance authorization.    Follow Up Recommendations  Skilled nursing-short term rehab (<3 hours/day)    Assistance Recommended at Discharge Frequent or constant Supervision/Assistance  Patient can return home with the following  A lot of help with walking and/or transfers;A lot of help with bathing/dressing/bathroom;Assistance with cooking/housework;Direct supervision/assist for medications management;Direct supervision/assist for financial management;Assist for transportation;Help with stairs or ramp for entrance   Equipment Recommendations  Other (comment)    Recommendations for Other Services      Precautions / Restrictions Precautions Precautions: Fall Restrictions Weight Bearing Restrictions: No       Mobility Bed Mobility Overal bed mobility: Needs Assistance Bed Mobility: Supine to Sit, Sit to Supine     Supine to sit: Min assist, Mod  assist, HOB elevated Sit to supine: Mod assist        Transfers Overall transfer level: Needs assistance Equipment used: Rolling walker (2 wheels) Transfers: Sit to/from Stand Sit to Stand: From elevated surface, Mod assist                 Balance Overall balance assessment: Needs assistance Sitting-balance support: No upper extremity supported, Feet supported Sitting balance-Leahy Scale: Fair     Standing balance support: Reliant on assistive device for balance Standing balance-Leahy Scale: Poor                               Extremity/Trunk Assessment Upper Extremity Assessment Upper Extremity Assessment: Generalized weakness   Lower Extremity Assessment Lower Extremity Assessment: Defer to PT evaluation   Cervical / Trunk Assessment Cervical / Trunk Assessment: Kyphotic    Vision       Perception     Praxis      Cognition Arousal/Alertness: Awake/alert Behavior During Therapy: Flat affect                                 Problem Solving: Decreased initiation                       General Comments  VSS on RA    Pertinent Vitals/ Pain       Pain Assessment Pain Assessment: No/denies pain Pain Intervention(s): Monitored during session  Frequency  Min 2X/week        Progress Toward Goals  OT Goals(current goals can now be found in the care plan section)  Progress towards OT goals: Not progressing toward goals - comment  Acute Rehab OT Goals OT Goal Formulation: With patient Time For Goal Achievement: 08/15/21 Potential to Achieve Goals: Fair  Plan Discharge plan remains appropriate;Frequency remains appropriate    Co-evaluation                 AM-PAC OT "6 Clicks" Daily Activity     Outcome Measure   Help from another person eating meals?: A Little Help from another person taking care of personal grooming?: A  Little Help from another person toileting, which includes using toliet, bedpan, or urinal?: A Lot Help from another person bathing (including washing, rinsing, drying)?: A Lot Help from another person to put on and taking off regular upper body clothing?: A Little Help from another person to put on and taking off regular lower body clothing?: A Lot 6 Click Score: 15    End of Session Equipment Utilized During Treatment: Gait belt;Rolling walker (2 wheels)  OT Visit Diagnosis: Unsteadiness on feet (R26.81);Other abnormalities of gait and mobility (R26.89);Muscle weakness (generalized) (M62.81)   Activity Tolerance Patient limited by fatigue   Patient Left in bed;with call bell/phone within reach;with family/visitor present   Nurse Communication Mobility status        Time: 0350-0938 OT Time Calculation (min): 21 min  Charges: OT General Charges $OT Visit: 1 Visit OT Treatments $Therapeutic Activity: 8-22 mins  08/07/2021  RP, OTR/L  Acute Rehabilitation Services  Office:  [phone]   Suzanna Obey 08/07/2021, 11:19 AM

## 2021-08-07 NOTE — Progress Notes (Signed)
PROGRESS NOTE    Erik Chan  NWG:[id number] DOB: Nov 28, 1955 DOA: 07/30/2021 PCP: [name], MD  Chief Complaint  Patient presents with   Weakness    Brief Narrative:  66 year old with history of COPD, nonischemic cardiomyopathy with reduced EF 20% comes to the ER with painless jaundice and loss of appetite.  Apparently patient felt very weak on 6/15 and laid on the floor and was unable to get back in her bed.  EMS was called to help with this.  Due to progressive weakness patient was brought to the hospital.  Upon admission patient was noted to be hyponatremic, elevated creatinine, transaminitis.  Ammonia level was normal.  Right upper quadrant ultrasound showed pleural effusion, ascites.  GI was consulted for acute hepatitis along with nephrology and CHF team due to concerns of low output cardiac failure.  Underwent RHC showing biventricular failure and started on milrinone and Lasix.      Assessment & Plan:   Principal Problem:   Acute hepatitis Active Problems:   Acute liver failure   Acute combined systolic and diastolic congestive heart failure (HCC)   Hyponatremia   AKI (acute kidney injury) (HCC)   Paroxysmal atrial fibrillation (HCC)   NSVT (nonsustained ventricular tachycardia) (HCC)   COPD (chronic obstructive pulmonary disease) (HCC)   Pleural effusion   Non-insulin dependent type 2 diabetes mellitus (HCC)   Protein-calorie malnutrition, severe   Chronic systolic CHF (congestive heart failure) (HCC)   Coagulopathy (HCC)   Chronic right heart failure (HCC)   Pressure injury of skin   Assessment and Plan: Acute liver failure Significantly elevated bilirubin, elevated ast/alt  Bili rising MRCP with cholelithiasis without intrahepatic or extrahepatic biliary dilation, no findings to suggest choledocholithiasis Negative acute hepatitis panel, negative ANA, negative ASMA, negative a1at Ceruloplasmin mildly elevated Negative EBV DNA PCR Mitochondrial ab  pending, hemochromatosis DNA PCR pending, HSV PCR pending GI c/s, appreciate recs -> most likely etiology congestive hepatopathy, planning for liver bx by IR   Acute combined systolic and diastolic congestive heart failure (HCC) Right heart cath 6/22 with severe biventricular failure with low output, no significant gradient between RA and HW pressures, suggesting cardiac cirrhosis Echo 05/30/2021 with EF 20-25% 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 vs genetic cardiomyopathy, +/- PVC's Appreciate cardiology assistance Lasix and milrinone Midodrine for BP  Hyponatremia Due to hypervolemia, diuresis per cardiology Renal signed off at this point, expected Na to improve with restoration of euvolemia  NSVT (nonsustained ventricular tachycardia) (HCC) Amiodarone and mexiletine on hold with liver failure  Paroxysmal atrial fibrillation [facility]) Per cardiology No amio given liver failure  AKI (acute kidney injury) (HCC) Per renal, suspected cardiorenal/hepatorenal  S/p octreotide/midodrine Gradually improving Follow with diuresis  COPD (chronic obstructive pulmonary disease) (HCC) No si/sx exacerbation  Pleural effusion CT abd/pelvis with bilateral pleural effusions, small ascites, anasarca Suspected related to volume overload with HF  Non-insulin dependent type 2 diabetes mellitus (HCC) A1c 02/2021 6.7 Continue SSI for now Home jardiance on hold  Protein-calorie malnutrition, severe noted      DVT prophylaxis: SCD Code Status: DNR Family Communication: daughter, mother Disposition:   Status is: Inpatient Remains inpatient appropriate because: continued liver failure, worsening   Consultants:  IR Cardiology gastroenterology  Procedures:  Findings:   Hepatic wedge = 26 RA = 22 RV = 57/24  PA = 55/29 (38) PCW = 26 (v = 34) Fick cardiac output/index = 3.4/1.8 Therm CO/CI = 2.1/1.0 PVR = 3.2 WU FA sat = 97% PA sat =  59%, 59%   PAPi = 1.2   Assessment: 1. Severe biventricular failure with low output 2. 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 Remains extremely tenuous.   Antimicrobials:  Anti-infectives (From admission, onward)    None       Subjective: No new complaints  Objective: Vitals:   08/07/21 0346 08/07/21 0803 08/07/21 1122 08/07/21 1617  BP: 94/69 90/66 (!) 93/56 98/66  Pulse: 99 99 90 94  Resp: 19 20 16 16   Temp: 98 F (36.7 C) 97.8 F (36.6 C)  97.8 F (36.6 C)  TempSrc: Oral     SpO2: 92% 90% 90% 91%  Weight: 74.4 kg     Height:        Intake/Output Summary (Last 24 hours) at 08/07/2021 1636 Last data filed at 08/07/2021 1454 Gross per 24 hour  Intake 693.33 ml  Output 2850 ml  Net -2156.67 ml   Filed Weights   08/05/21 0500 08/06/21 0515 08/07/21 0346  Weight: 73.7 kg 74 kg 74.4 kg    Examination:  General exam: jaundiced generalized, appears tired Scleral icterus Respiratory system: unlabored Cardiovascular system: RRR Gastrointestinal system: Abdomen is nondistended, soft and nontende Central nervous system: Alert and oriented. No focal neurological deficits. Extremities: no LEE, did have sacral edema    Data Reviewed: I have personally reviewed following labs and imaging studies  CBC: Recent Labs  Lab 08/01/21 0422 08/01/21 1716 08/01/21 1727 08/02/21 0439 08/03/21 0537 08/05/21 0755 08/06/21 0517  WBC 6.8  --   --  7.5 8.3 8.2 8.8  HGB 14.2   < > 15.6  16.3 13.4 12.4* 11.2* 12.1*  HCT 40.0   < > 46.0  48.0 38.0* 34.9* 31.5* 33.7*  MCV 88.9  --   --  89.8 88.1 87.7 88.2  PLT 48*  --   --  58* 65* 75* 85*   < > = values in this interval not displayed.    Basic Metabolic Panel: Recent Labs  Lab 08/03/21 0537 08/03/21 1135 08/04/21 0323 08/05/21 0436 08/06/21 0517 08/07/21 0353  NA 121* 119* 122* 123* 121* 122*  K 3.4*  --  3.9 3.8  4.3 4.2  CL 83*  --  81* 85* 82* 79*  CO2 26  --  27 28 29 31   GLUCOSE 110*  --  85 87 95 87  BUN 46*  --  39* 27* 28* 25*  CREATININE 1.22  --  1.19 0.97 0.98 0.92  CALCIUM 7.8*  --  7.8* 7.3* 8.0* 8.2*  MG 2.7*  --  2.4 2.3 2.3 2.3    GFR: Estimated Creatinine Clearance: 83.1 mL/min (by C-G formula based on SCr of 0.92 mg/dL).  Liver Function Tests: Recent Labs  Lab 08/03/21 0537 08/04/21 0323 08/05/21 0436 08/06/21 0517 08/07/21 0353  AST 115* 116* 119* 144* 157*  ALT 241* 214* 180* 187* 180*  ALKPHOS 89 80 83 107 151*  BILITOT 25.1* 26.2* 26.1* 29.2* 30.1*  PROT 5.7* 5.9* 5.4* 6.0* 6.0*  ALBUMIN 2.6* 2.5* 2.4* 2.5* 2.5*    CBG: Recent Labs  Lab 08/06/21 1649 08/06/21 2047 08/07/21 0624 08/07/21 1124 08/07/21 1620  GLUCAP 88 93 90 83 123*     No results found for this or any previous visit (from the past 240 hour(s)).       Radiology Studies: No results found.      Scheduled Meds:  Chlorhexidine Gluconate  Cloth  6 each Topical Daily   digoxin  0.125 mg Oral Daily   feeding supplement  237 mL Oral BID BM   insulin aspart  0-6 Units Subcutaneous TID WC   lactulose  20 g Oral BID   midodrine  10 mg Oral TID WC   multivitamin with minerals  1 tablet Oral Daily   octreotide  100 mcg Subcutaneous TID   potassium chloride  40 mEq Oral BID   sodium chloride flush  3 mL Intravenous Q12H   sodium chloride flush  3 mL Intravenous Q12H   sodium chloride flush  3 mL Intravenous Q12H   spironolactone  12.5 mg Oral Daily   Continuous Infusions:  sodium chloride     sodium chloride     furosemide (LASIX) 200 mg in dextrose 5 % 100 mL (2 mg/mL) infusion 12 mg/hr (08/07/21 0300)   milrinone 0.125 mcg/kg/min (08/07/21 0356)     LOS: 8 days    Time spent: 55    Lacretia Nicks, MD [facility]   To contact the attending provider between 7A-7P or the covering provider during after hours 7P-7A, please log into the web site [email or web address] and  access using universal [place] password for that web site. If you do not have the password, please call the hospital operator.  08/07/2021, 4:36 PM

## 2021-08-07 NOTE — Assessment & Plan Note (Signed)
Per cardiology No amio given liver failure

## 2021-08-07 NOTE — Assessment & Plan Note (Addendum)
Right heart cath 6/22 with severe biventricular failure with low output, no significant gradient between RA and HW pressures, suggesting cardiac cirrhosis Echo 05/30/2021 with EF 20-25% 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 vs genetic cardiomyopathy, +/- PVC's Appreciate cardiology assistance Milrinone d/c'd per cards, loop on hold per cards spironolactone Midodrine for BP

## 2021-08-07 NOTE — Hospital Course (Signed)
66 year old with history of COPD, nonischemic cardiomyopathy with reduced EF 20% comes to the ER with painless jaundice and loss of appetite.  Apparently patient felt very weak on 6/15 and laid on the floor and was unable to get back in her bed.  EMS was called to help with this.  Due to progressive weakness patient was brought to the hospital.  Upon admission patient was noted to be hyponatremic, elevated creatinine, transaminitis.  Ammonia level was normal.  Right upper quadrant ultrasound showed pleural effusion, ascites.  GI was consulted for acute hepatitis along with nephrology and CHF team due to concerns of low output cardiac failure.  Underwent RHC showing biventricular failure and started on milrinone and Lasix.

## 2021-08-07 NOTE — Assessment & Plan Note (Addendum)
Worsening gradually over past few days Fluid restriction Due to hypervolemia, diuresis per cardiology Renal signed off at this point, expected Na to improve with restoration of euvolemia

## 2021-08-07 NOTE — Consult Note (Signed)
Chief Complaint: Patient was seen in consultation today for jaundice   Referring Physician(s): Dr. Levora Angel  Supervising Physician: Oley Balm  Patient Status: [facility] - In-pt  History of Present Illness: Erik Chan is a 66 y.o. male with past medical history of biventricular failure, tobacco abuse, COPD, DM2, cardiomyopathy who presented to [facility] ED with painless jaundice.  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  GI has consulted in the setting of worsening jaundice and liver function tests.  Work-up underway and has thus far been unrevealing as he has a  negative hepatitis panel.  Normal ANA and ASMA.  Mildly elevated ceruloplasmin.  Normal iron saturation.  Elevated ferritin.  Normal alpha-1 antitrypsin level.  Negative EBV PCR.  IR now consulted for random liver biopsy.   Past Medical History:  Diagnosis Date   CHF (congestive heart failure) (HCC) 02/13/2021   LVEF less than 20%   COPD (chronic obstructive pulmonary disease) (HCC)    Fibromyalgia     Past Surgical History:  Procedure Laterality Date   CENTRAL LINE INSERTION Left 08/01/2021   Procedure: CENTRAL LINE INSERTION;  Surgeon: Dolores Patty, MD;  Location: [facility] INVASIVE CV LAB;  Service: Cardiovascular;  Laterality: Left;   RIGHT HEART CATH N/A 08/01/2021   Procedure: RIGHT HEART CATH;  Surgeon: Dolores Patty, MD;  Location: [facility] INVASIVE CV LAB;  Service: Cardiovascular;  Laterality: N/A;   RIGHT/LEFT HEART CATH AND CORONARY ANGIOGRAPHY N/A 02/19/2021   Procedure: RIGHT/LEFT HEART CATH AND CORONARY ANGIOGRAPHY;  Surgeon: Dolores Patty, MD;  Location: [facility] INVASIVE CV LAB;  Service: Cardiovascular;  Laterality: N/A;    Allergies: Shellfish-derived products  Medications: Prior to Admission medications   Medication Sig Start Date End Date Taking? Authorizing Provider  acetaminophen (TYLENOL) 500 MG tablet Take 500 mg by mouth every 6 (six)  hours as needed for mild pain, fever or headache.   Yes [provider]  albuterol (VENTOLIN HFA) 108 (90 Base) MCG/ACT inhaler Inhale 1-2 puffs into the lungs every 4 (four) hours as needed for wheezing or shortness of breath. 01/28/21  Yes [provider]  amiodarone (PACERONE) 200 MG tablet Take 1 tablet (200 mg total) by mouth daily. 05/30/21  Yes Bensimhon, Bevelyn Buckles, MD  empagliflozin (JARDIANCE) 10 MG TABS tablet Take 1 tablet (10 mg total) by mouth daily. 03/26/21  Yes Laurey Morale, MD  mexiletine (MEXITIL) 200 MG capsule Take 1 capsule (200 mg total) by mouth every 12 (twelve) hours. 03/26/21  Yes Laurey Morale, MD  midodrine (PROAMATINE) 5 MG tablet Take 1 tablet (5 mg total) by mouth 3 (three) times daily with meals. 04/15/21  Yes Milford, Anderson Malta, FNP  spironolactone (ALDACTONE) 25 MG tablet Take 1 tablet (25 mg total) by mouth daily. 03/26/21  Yes Laurey Morale, MD     Family History  Problem Relation Age of Onset   Asthma Mother    Allergies Mother    Fibromyalgia Mother    Osteoarthritis Mother    Heart Problems Father    Osteoarthritis Maternal Grandmother     Social History   Socioeconomic History   Marital status: Married    Spouse name: Not on file   Number of children: Not on file   Years of education: Not on file   Highest education level: Not on file  Occupational History   Occupation: Banker   Tobacco Use   Smoking status: Former    Packs/day: 1.00  Years: 30.00    Total pack years: 30.00    Types: Pipe, Cigarettes   Smokeless tobacco: Never  Vaping Use   Vaping Use: Never used  Substance and Sexual Activity   Alcohol use: No    Alcohol/week: 0.0 standard drinks of alcohol   Drug use: No   Sexual activity: Not on file  Other Topics Concern   Not on file  Social History Narrative   Not on file   Social Determinants of Health   Financial Resource Strain: Not on file  Food Insecurity: Not on file  Transportation Needs:  Not on file  Physical Activity: Not on file  Stress: Not on file  Social Connections: Not on file     Review of Systems: A 12 point ROS discussed and pertinent positives are indicated in the HPI above.  All other systems are negative.  Review of Systems  Constitutional:  Positive for activity change and fatigue. Negative for fever.  Respiratory:  Negative for cough and shortness of breath.   Cardiovascular:  Negative for chest pain.  Gastrointestinal:  Negative for abdominal pain.  Musculoskeletal:  Negative for back pain.  Skin:  Positive for color change.  Psychiatric/Behavioral:  Negative for behavioral problems and confusion.     Vital Signs: BP (!) 93/56 (BP Location: Left Arm)   Pulse 90   Temp 97.8 F (36.6 C)   Resp 16   Ht 6\' 2"  (1.88 m)   Wt 164 lb 0.4 oz (74.4 kg)   SpO2 90%   BMI 21.06 kg/m   Physical Exam Vitals and nursing note reviewed.  Constitutional:      General: He is not in acute distress.    Appearance: Normal appearance. He is ill-appearing.  HENT:     Mouth/Throat:     Mouth: Mucous membranes are moist.     Pharynx: Oropharynx is clear.  Cardiovascular:     Rate and Rhythm: Normal rate and regular rhythm.  Pulmonary:     Effort: Pulmonary effort is normal.     Breath sounds: Normal breath sounds.  Abdominal:     General: There is distension (mild).     Palpations: Abdomen is soft.  Skin:    General: Skin is warm and dry.  Neurological:     General: No focal deficit present.     Mental Status: He is alert and oriented to person, place, and time. Mental status is at baseline.  Psychiatric:        Mood and Affect: Mood normal.        Behavior: Behavior normal.        Thought Content: Thought content normal.        Judgment: Judgment normal.      MD Evaluation Airway: WNL Heart: Other (comments) Heart  comments: EF 20% Abdomen: WNL Chest/ Lungs: WNL Other Pertinent Findings: Liver failure ASA  Classification:  4 Mallampati/Airway Score: Two   Imaging: CARDIAC CATHETERIZATION  Result Date: 08/01/2021 Findings: Hepatic wedge = 26 RA = 22 RV = 57/24 PA = 55/29 (38) PCW = 26 (v = 34) Fick cardiac output/index = 3.4/1.8 Therm CO/CI = 2.1/1.0 PVR = 3.2 WU FA sat = 97% PA sat = 59%, 59% PAPi = 1.2 Assessment: 1. Severe biventricular failure with low output 2. 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 Remains extremely tenuous. 08/03/2021, MD 6:12 PM  MR ABDOMEN MRCP W WO CONTAST  Result Date: 07/31/2021  CLINICAL DATA:  Cholelithiasis on CT imaging. EXAM: MRI ABDOMEN WITHOUT AND WITH CONTRAST (INCLUDING MRCP) TECHNIQUE: Multiplanar multisequence MR imaging of the abdomen was performed both before and after the administration of intravenous contrast. Heavily T2-weighted images of the biliary and pancreatic ducts were obtained, and three-dimensional MRCP images were rendered by post processing. CONTRAST:  7.5mL GADAVIST GADOBUTROL 1 MMOL/ML IV SOLN COMPARISON:  CT and ultrasound exams from 07/30/2021 FINDINGS: Lower chest: Heart is enlarged. Moderate to large bilateral pleural effusions evident. Hepatobiliary: No suspicious focal abnormality within the liver parenchyma. Small focus of differential perfusion identified in the subcapsular liver along the gallbladder fundus, likely benign transient hepatic intensity difference. 1.6 x 1.2 cm gallstone evident. No substantial gallbladder wall thickening or pericholecystic fluid although there is free fluid around the liver. No intrahepatic or extrahepatic biliary dilation. Pancreas: No focal mass lesion. No dilatation of the main duct. No intraparenchymal cyst. No peripancreatic edema. Spleen:  No splenomegaly. No focal mass lesion. Adrenals/Urinary Tract: No adrenal nodule or mass. Right kidney unremarkable. 5 cm simple cyst noted upper pole left kidney (Bosniak) No followup  recommended. Stomach/Bowel: Stomach is unremarkable. No gastric wall thickening. No evidence of outlet obstruction. Duodenum is normally positioned as is the ligament of Treitz. No small bowel or colonic dilatation within the visualized abdomen. Vascular/Lymphatic: No abdominal aortic aneurysm. No abdominal lymphadenopathy. Other: Diffuse mesenteric edema is associated with small to moderate volume ascites. Musculoskeletal: Diffuse body wall edema evident. No focal suspicious marrow enhancement within the visualized bony anatomy. IMPRESSION: 1. Cholelithiasis without intrahepatic or extrahepatic biliary dilation. No findings to suggest choledocholithiasis. 2. Moderate to large bilateral pleural effusions. 3. Diffuse mesenteric edema with small to moderate volume ascites. 4. Diffuse body wall edema. Electronically Signed   By: Kennith Center M.D.   On: 07/31/2021 10:03   MR 3D Recon At Scanner  Result Date: 07/31/2021 CLINICAL DATA:  Cholelithiasis on CT imaging. EXAM: MRI ABDOMEN WITHOUT AND WITH CONTRAST (INCLUDING MRCP) TECHNIQUE: Multiplanar multisequence MR imaging of the abdomen was performed both before and after the administration of intravenous contrast. Heavily T2-weighted images of the biliary and pancreatic ducts were obtained, and three-dimensional MRCP images were rendered by post processing. CONTRAST:  7.35mL GADAVIST GADOBUTROL 1 MMOL/ML IV SOLN COMPARISON:  CT and ultrasound exams from 07/30/2021 FINDINGS: Lower chest: Heart is enlarged. Moderate to large bilateral pleural effusions evident. Hepatobiliary: No suspicious focal abnormality within the liver parenchyma. Small focus of differential perfusion identified in the subcapsular liver along the gallbladder fundus, likely benign transient hepatic intensity difference. 1.6 x 1.2 cm gallstone evident. No substantial gallbladder wall thickening or pericholecystic fluid although there is free fluid around the liver. No intrahepatic or extrahepatic  biliary dilation. Pancreas: No focal mass lesion. No dilatation of the main duct. No intraparenchymal cyst. No peripancreatic edema. Spleen:  No splenomegaly. No focal mass lesion. Adrenals/Urinary Tract: No adrenal nodule or mass. Right kidney unremarkable. 5 cm simple cyst noted upper pole left kidney (Bosniak) No followup recommended. Stomach/Bowel: Stomach is unremarkable. No gastric wall thickening. No evidence of outlet obstruction. Duodenum is normally positioned as is the ligament of Treitz. No small bowel or colonic dilatation within the visualized abdomen. Vascular/Lymphatic: No abdominal aortic aneurysm. No abdominal lymphadenopathy. Other: Diffuse mesenteric edema is associated with small to moderate volume ascites. Musculoskeletal: Diffuse body wall edema evident. No focal suspicious marrow enhancement within the visualized bony anatomy. IMPRESSION: 1. Cholelithiasis without intrahepatic or extrahepatic biliary dilation. No findings to suggest choledocholithiasis. 2. Moderate to large bilateral  pleural effusions. 3. Diffuse mesenteric edema with small to moderate volume ascites. 4. Diffuse body wall edema. Electronically Signed   By: Kennith Center M.D.   On: 07/31/2021 10:03   CT ABDOMEN PELVIS WO CONTRAST  Result Date: 07/30/2021 CLINICAL DATA:  Ascites and jaundice. EXAM: CT ABDOMEN AND PELVIS WITHOUT CONTRAST TECHNIQUE: Multidetector CT imaging of the abdomen and pelvis was performed following the standard protocol without IV contrast. RADIATION DOSE REDUCTION: This exam was performed according to the departmental dose-optimization program which includes automated exposure control, adjustment of the mA and/or kV according to patient size and/or use of iterative reconstruction technique. COMPARISON:  Abdominal ultrasound dated 07/30/2021. FINDINGS: Evaluation of this exam is limited in the absence of intravenous contrast. Lower chest: Partially visualized moderate right and small left pleural  effusions with partial compressive atelectasis of the lung bases versus pneumonia. There is mild cardiomegaly with mild biatrial dilatation. No intra-abdominal free air. Diffuse mesenteric edema and small ascites. Hepatobiliary: The liver is unremarkable. No biliary ductal dilatation. There is a gallstone. High attenuating content within the gallbladder most consistent with vicarious excretion of recently administered contrast. Pancreas: The pancreas is unremarkable as visualized. Spleen: Normal in size without focal abnormality. Adrenals/Urinary Tract: The adrenal glands are unremarkable. There is no hydronephrosis or nephrolithiasis on either side. There is a 4.5 cm left renal upper pole cyst. The visualized ureters and urinary bladder appear unremarkable. Stomach/Bowel: There is sigmoid diverticulosis. There is no bowel obstruction. The appendix is normal. Vascular/Lymphatic: Mild aortoiliac atherosclerotic disease. The IVC is unremarkable. No portal venous gas. There is no adenopathy. Reproductive: The prostate and seminal vesicles are grossly unremarkable. No pelvic mass. Other: Diffuse mesenteric and subcutaneous edema and anasarca. Musculoskeletal: No acute or significant osseous findings. IMPRESSION: 1. Bilateral pleural effusions, small ascites, and anasarca. 2. Cholelithiasis. 3. Sigmoid diverticulosis. No bowel obstruction. Normal appendix. 4. Aortic Atherosclerosis (ICD10-I70.0). Electronically Signed   By: Elgie Collard M.D.   On: 07/30/2021 22:16   US Abdomen Limited RUQ (LIVER/GB)  Result Date: 07/30/2021 CLINICAL DATA:  Jaundice EXAM: ULTRASOUND ABDOMEN LIMITED RIGHT UPPER QUADRANT COMPARISON:  Abdominal ultrasound 01/30/2021 FINDINGS: Gallbladder: 2.2 cm echogenic shadowing calculus. Low-level echogenic sludge. No wall thickening or sonographic Murphy's sign. Common bile duct: Diameter: 4 mm Liver: Increased echogenicity of the parenchyma with no focal mass identified. Portal vein is patent  on color Doppler imaging with normal direction of blood flow towards the liver. Other: Right pleural effusion.  Ascites. IMPRESSION: 1. Right pleural effusion. 2. Ascites. 3. Increased echogenicity of the liver parenchyma which may represent hepatic steatosis and/or other hepatocellular disease. 4. Large gallstone.  Gallbladder sludge. Electronically Signed   By: Jannifer Hick M.D.   On: 07/30/2021 19:11    Labs:  CBC: Recent Labs    08/02/21 0439 08/03/21 0537 08/05/21 0755 08/06/21 0517  WBC 7.5 8.3 8.2 8.8  HGB 13.4 12.4* 11.2* 12.1*  HCT 38.0* 34.9* 31.5* 33.7*  PLT 58* 65* 75* 85*    COAGS: Recent Labs    07/30/21 1722 08/01/21 0422  INR 1.6* 1.5*    BMP: Recent Labs    08/04/21 0323 08/05/21 0436 08/06/21 0517 08/07/21 0353  NA 122* 123* 121* 122*  K 3.9 3.8 4.3 4.2  CL 81* 85* 82* 79*  CO2 27 28 29 31   GLUCOSE 85 87 95 87  BUN 39* 27* 28* 25*  CALCIUM 7.8* 7.3* 8.0* 8.2*  CREATININE 1.19 0.97 0.98 0.92  GFRNONAA >60 >60 >60 >60    LIVER  FUNCTION TESTS: Recent Labs    08/04/21 0323 08/05/21 0436 08/06/21 0517 08/07/21 0353  BILITOT 26.2* 26.1* 29.2* 30.1*  AST 116* 119* 144* 157*  ALT 214* 180* 187* 180*  ALKPHOS 80 83 107 151*  PROT 5.9* 5.4* 6.0* 6.0*  ALBUMIN 2.5* 2.4* 2.5* 2.5*    TUMOR MARKERS: No results for input(s): "AFPTM", "CEA", "CA199", "CHROMGRNA" in the last 8760 hours.  Assessment and Plan: Elevated Tbili, jaundice Patient undergoing work-up for acute hepatitis.  GI requesting liver biopsy.  Case reviewed by Dr. Deanne Coffer who approves patient for transjugular liver biopsy.   Patient currently has L IJ in place for milrinone infusion.  NPO p MN.   Discussed with patient and family at bedside.   Thank you for this interesting consult.  I greatly enjoyed meeting Zaion Weers and look forward to participating in their care.  A copy of this report was sent to the requesting provider on this date.  Electronically  Signed: Hoyt Koch, PA 08/07/2021, 3:07 PM   I spent a total of 40 Minutes    in face to face in clinical consultation, greater than 50% of which was counseling/coordinating care for jaundice, hyperbilirubinemia.

## 2021-08-07 NOTE — Assessment & Plan Note (Addendum)
Significantly elevated bilirubin, elevated ast/alt  Bili mild downtrend today, follow MRCP with cholelithiasis without intrahepatic or extrahepatic biliary dilation, no findings to suggest choledocholithiasis Negative acute hepatitis panel, negative ANA, negative ASMA, negative a1at Ceruloplasmin mildly elevated Negative EBV DNA PCR Mitochondrial ab negative, hemochromatosis DNA PCR pending, HSV PCR pending GI c/s, appreciate recs -> most likely etiology congestive hepatopathy, planning for liver bx by IR today S/p transjugular liver bx - follow results - pending

## 2021-08-07 NOTE — Assessment & Plan Note (Addendum)
Per renal, suspected cardiorenal/hepatorenal  Octreotide/midodrine -> will d/c octreotide and follow improved

## 2021-08-07 NOTE — Progress Notes (Signed)
Physical Therapy Treatment Patient Details Name: Erik Chan MRN: [id number] DOB: [birth year] Today's Date: 08/07/2021   History of Present Illness Pt is a 66 y.o. male admitted 07/30/21 with jaundice, generalized weakness (reports not being able to get up since 07/25/21). 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 S/p RHC 6/22 with biventricular failure, concern for low output. PMH includes HF (EF 20%), NICM, COPD, DM2, fibromyalgia.    PT Comments    Pt received supine with HOB elevated and agreeable to session with max encouragement for OOB mobility, despite resistance pt with steady progress towards goals. Pt continues to be self limiting stating throughout session that he "cant" while demonstrating ability. Pt needing min assist for bed mobility and up to mod assist for transfers. Pt able to demonstrate ambulation in room with RW with fair stability and no LOB. Substantial time spend educating pt on importance of continued mobility and time OOB, with pt verbalizing understanding. Pt agreeable to time up in chair at end of session. Pt continues to benefit from skilled PT services to progress toward functional mobility goals.    Recommendations for follow up therapy are one component of a multi-disciplinary discharge planning process, led by the attending physician.  Recommendations may be updated based on patient status, additional functional criteria and insurance authorization.  Follow Up Recommendations  Skilled nursing-short term rehab (<3 hours/day) Can patient physically be transported by private vehicle: Yes   Assistance Recommended at Discharge Intermittent Supervision/Assistance  Patient can return home with the following A lot of help with walking and/or transfers;A lot of help with bathing/dressing/bathroom;Assistance with cooking/housework;Help with stairs or ramp for entrance;Assist for transportation    Equipment Recommendations  Hospital bed;Wheelchair cushion (measurements PT);Wheelchair (measurements PT)    Recommendations for Other Services       Precautions / Restrictions Precautions Precautions: Fall Restrictions Weight Bearing Restrictions: No     Mobility  Bed Mobility Overal bed mobility: Needs Assistance Bed Mobility: Supine to Sit     Supine to sit: Min assist     General bed mobility comments: min assist to bring LEs to and off bed and to elevate trunk, stating throughout he cant do it and askign for PTA to tell him how when this PTA asked him to come to sitting EOB, with HHA to elevate trunk very light min assist actually needed to come to upright    Transfers Overall transfer level: Needs assistance Equipment used: Rolling walker (2 wheels) Transfers: Sit to/from Stand Sit to Stand: From elevated surface, Mod assist           General transfer comment: mod assist to power up with increased time and reassurance    Ambulation/Gait Ambulation/Gait assistance: Min assist Gait Distance (Feet): 10 Feet Assistive device: Rolling walker (2 wheels) Gait Pattern/deviations: Step-through pattern, Decreased step length - right, Decreased step length - left, Shuffle, Trunk flexed Gait velocity: decr     General Gait Details: slwo shuffling gait with trunk felxed despite cues to elevate, pt stating "i can't" overall fairly stable with no LOB depiste pt frequently stating he is going to fall.   Stairs             Wheelchair Mobility    Modified Rankin (Stroke Patients Only)       Balance Overall balance assessment: Needs assistance Sitting-balance support: No upper extremity supported, Feet supported Sitting balance-Leahy Scale: Fair     Standing balance support: Reliant on assistive device for balance  Standing balance-Leahy Scale: Poor Standing balance comment: Reliant on RW                            Cognition  Arousal/Alertness: Awake/alert Behavior During Therapy: Flat affect, Agitated                                 Problem Solving: Decreased initiation General Comments: contines to demonstrate self-limiting behavior stating "i cant do it" despite demonstrating ablity in previous sessions. argumentative        Exercises General Exercises - Lower Extremity Hip Flexion/Marching: AROM, Left, Right, 20 reps, Seated Heel Raises: AROM, 10 reps, Seated    General Comments General comments (skin integrity, edema, etc.): max encouragement for pt to get OOB, increased time discussing and educating on importance of increased mobility and participation with therapies; unsure pt grasping this concept as he continues to state "yeah yeah yeah"      Pertinent Vitals/Pain Pain Assessment Pain Assessment: No/denies pain Pain Intervention(s): Monitored during session    Home Living                          Prior Function            PT Goals (current goals can now be found in the care plan section) Acute Rehab PT Goals PT Goal Formulation: With patient Time For Goal Achievement: 08/14/21    Frequency    Min 3X/week      PT Plan Current plan remains appropriate    Co-evaluation              AM-PAC PT "6 Clicks" Mobility   Outcome Measure  Help needed turning from your back to your side while in a flat bed without using bedrails?: A Lot Help needed moving from lying on your back to sitting on the side of a flat bed without using bedrails?: A Lot Help needed moving to and from a bed to a chair (including a wheelchair)?: A Lot Help needed standing up from a chair using your arms (e.g., wheelchair or bedside chair)?: A Lot Help needed to walk in hospital room?: A Lot Help needed climbing 3-5 steps with a railing? : Total 6 Click Score: 11    End of Session Equipment Utilized During Treatment: Gait belt Activity Tolerance: Other (comment) (self  limiting) Patient left: in chair;with call bell/phone within reach;with family/visitor present Nurse Communication: Mobility status PT Visit Diagnosis: Unsteadiness on feet (R26.81);Muscle weakness (generalized) (M62.81);Difficulty in walking, not elsewhere classified (R26.2)     Time: 8657-8469 PT Time Calculation (min) (ACUTE ONLY): 27 min  Charges:  $Gait Training: 8-22 mins $Therapeutic Activity: 8-22 mins                    Dariel Pellecchia R. PTA Acute Rehabilitation Services Office: [phone]   Catalina Antigua 08/07/2021, 12:41 PM

## 2021-08-08 ENCOUNTER — Inpatient Hospital Stay (HOSPITAL_COMMUNITY): Payer: Medicare Other

## 2021-08-08 DIAGNOSIS — B179 Acute viral hepatitis, unspecified: Secondary | ICD-10-CM | POA: Diagnosis not present

## 2021-08-08 HISTORY — PX: IR US GUIDE VASC ACCESS RIGHT: IMG2390

## 2021-08-08 HISTORY — PX: IR VENOGRAM HEPATIC W HEMODYNAMIC EVALUATION: IMG692

## 2021-08-08 HISTORY — PX: IR TRANSCATHETER BX: IMG713

## 2021-08-08 LAB — CBC WITH DIFFERENTIAL/PLATELET
Abs Immature Granulocytes: 0.12 10*3/uL — ABNORMAL HIGH (ref 0.00–0.07)
Basophils Absolute: 0 10*3/uL (ref 0.0–0.1)
Basophils Relative: 0 %
Eosinophils Absolute: 0 10*3/uL (ref 0.0–0.5)
Eosinophils Relative: 0 %
HCT: 33.5 % — ABNORMAL LOW (ref 39.0–52.0)
Hemoglobin: 12.4 g/dL — ABNORMAL LOW (ref 13.0–17.0)
Immature Granulocytes: 1 %
Lymphocytes Relative: 8 %
Lymphs Abs: 0.8 10*3/uL (ref 0.7–4.0)
MCH: 32.2 pg (ref 26.0–34.0)
MCHC: 37 g/dL — ABNORMAL HIGH (ref 30.0–36.0)
MCV: 87 fL (ref 80.0–100.0)
Monocytes Absolute: 0.7 10*3/uL (ref 0.1–1.0)
Monocytes Relative: 7 %
Neutro Abs: 7.5 10*3/uL (ref 1.7–7.7)
Neutrophils Relative %: 84 %
Platelets: 97 10*3/uL — ABNORMAL LOW (ref 150–400)
RBC: 3.85 MIL/uL — ABNORMAL LOW (ref 4.22–5.81)
RDW: 20.1 % — ABNORMAL HIGH (ref 11.5–15.5)
WBC: 9.1 10*3/uL (ref 4.0–10.5)
nRBC: 0 % (ref 0.0–0.2)

## 2021-08-08 LAB — COMPREHENSIVE METABOLIC PANEL
ALT: 169 U/L — ABNORMAL HIGH (ref 0–44)
AST: 148 U/L — ABNORMAL HIGH (ref 15–41)
Albumin: 2.4 g/dL — ABNORMAL LOW (ref 3.5–5.0)
Alkaline Phosphatase: 142 U/L — ABNORMAL HIGH (ref 38–126)
Anion gap: 11 (ref 5–15)
BUN: 21 mg/dL (ref 8–23)
CO2: 34 mmol/L — ABNORMAL HIGH (ref 22–32)
Calcium: 8 mg/dL — ABNORMAL LOW (ref 8.9–10.3)
Chloride: 81 mmol/L — ABNORMAL LOW (ref 98–111)
Creatinine, Ser: 0.92 mg/dL (ref 0.61–1.24)
GFR, Estimated: >60 mL/min (ref >60)
Glucose, Bld: 90 mg/dL (ref 70–99)
Potassium: 3.6 mmol/L (ref 3.5–5.1)
Sodium: 126 mmol/L — ABNORMAL LOW (ref 135–145)
Total Bilirubin: 29.5 mg/dL (ref 0.3–1.2)
Total Protein: 6 g/dL — ABNORMAL LOW (ref 6.5–8.1)

## 2021-08-08 LAB — AMMONIA: Ammonia: 25 umol/L (ref 9–35)

## 2021-08-08 LAB — COOXEMETRY PANEL
Carboxyhemoglobin: 3.4 % — ABNORMAL HIGH (ref 0.5–1.5)
Methemoglobin: <0.7 % (ref 0.0–1.5)
O2 Saturation: 71.8 %
Total hemoglobin: 12.5 g/dL (ref 12.0–16.0)

## 2021-08-08 LAB — GLUCOSE, CAPILLARY
Glucose-Capillary: 86 mg/dL (ref 70–99)
Glucose-Capillary: 90 mg/dL (ref 70–99)
Glucose-Capillary: 103 mg/dL — ABNORMAL HIGH (ref 70–99)
Glucose-Capillary: 75 mg/dL (ref 70–99)

## 2021-08-08 LAB — MITOCHONDRIAL ANTIBODIES: Mitochondrial M2 Ab, IgG: <20 Units (ref 0.0–20.0)

## 2021-08-08 LAB — MAGNESIUM: Magnesium: 2.2 mg/dL (ref 1.7–2.4)

## 2021-08-08 LAB — PHOSPHORUS: Phosphorus: 3.5 mg/dL (ref 2.5–4.6)

## 2021-08-08 MED ORDER — LIDOCAINE HCL 1 % IJ SOLN
INTRAMUSCULAR | Status: AC
  Filled 2021-08-08: qty 20

## 2021-08-08 MED ORDER — ENSURE ENLIVE PO LIQD
237.0000 mL | Freq: Three times a day (TID) | ORAL | Status: DC
  Administered 2021-08-08 – 2021-08-15 (×14): 237 mL via ORAL

## 2021-08-08 MED ORDER — IOHEXOL 300 MG/ML  SOLN: 150.0000 mL | Freq: Once | INTRAMUSCULAR | Status: DC | PRN

## 2021-08-08 MED ORDER — MORPHINE SULFATE (PF) 2 MG/ML IV SOLN
0.5000 mg | INTRAVENOUS | Status: DC | PRN
  Administered 2021-08-08: 0.5 mg via INTRAVENOUS
  Filled 2021-08-08: qty 1

## 2021-08-08 MED ORDER — TORSEMIDE 20 MG PO TABS
40.0000 mg | ORAL_TABLET | Freq: Every day | ORAL | Status: DC
  Administered 2021-08-08: 40 mg via ORAL
  Filled 2021-08-08: qty 2

## 2021-08-08 MED ORDER — GERHARDT'S BUTT CREAM
TOPICAL_CREAM | Freq: Two times a day (BID) | CUTANEOUS | Status: DC
  Administered 2021-08-08 – 2021-08-14 (×2): 1 via TOPICAL
  Filled 2021-08-08: qty 1

## 2021-08-08 NOTE — Procedures (Addendum)
Interventional Radiology Procedure Note  Procedure:   US guided right jugular vein access HV measurements Transjugular liver bx .  Complications: None EBL: 0 Findings: Free hepatic venous pressure: Wedged pressure: No evidence portal HTN by pressure measurement  Recommendations:  - Follow up path - routine wound care - Do not submerge for 7 days - Advance diet per primary  Signed,  Yvone Neu. Loreta Ave, DO

## 2021-08-08 NOTE — Progress Notes (Signed)
[facility] Gastroenterology Progress Note  Erik Chan 66 y.o. October 18, 1955  CC: Abnormal LFTs   Subjective: Patient seen and examined at bedside.  Just came back from liver biopsy.  Denies any acute issues.  ROS : Afebrile, negative for chest pain   Objective: Vital signs in last 24 hours: Vitals:   08/08/21 0900 08/08/21 0910  BP: (!) 93/56 (!) 88/60  Pulse: 94 92  Resp: 17 16  Temp:    SpO2: 90% 96%    Physical Exam:  General:  Alert, cooperative, no distress, appears stated age  Head:  Normocephalic, without obvious abnormality, atraumatic  Eyes:  , EOM's intact,   Lungs:   Clear to auscultation bilaterally, respirations unlabored  Heart:  Regular rate and rhythm, S1, S2 normal  Abdomen:   Soft, non-tender, minimal abdominal distention, bowel sounds present, no peritoneal sign          Lab Results: Recent Labs    08/07/21 0353 08/08/21 0613  NA 122* 126*  K 4.2 3.6  CL 79* 81*  CO2 31 34*  GLUCOSE 87 90  BUN 25* 21  CREATININE 0.92 0.92  CALCIUM 8.2* 8.0*  MG 2.3 2.2  PHOS  --  3.5   Recent Labs    08/07/21 0353 08/08/21 0613  AST 157* 148*  ALT 180* 169*  ALKPHOS 151* 142*  BILITOT 30.1* 29.5*  PROT 6.0* 6.0*  ALBUMIN 2.5* 2.4*   Recent Labs    08/06/21 0517 08/08/21 0613  WBC 8.8 9.1  NEUTROABS  --  7.5  HGB 12.1* 12.4*  HCT 33.7* 33.5*  MCV 88.2 87.0  PLT 85* 97*   No results for input(s): "LABPROT", "INR" in the last 72 hours.    Assessment/Plan: -Abnormal LFTs.  Patient with mild elevated LFTs since December 2022 but now having significant jaundice.  Work-up negative so far except for significantly elevated ferritin which could be from acute phase reactant. -Negative hepatitis panel.  Normal ANA and ASMA.  Mildly elevated ceruloplasmin.  Normal iron saturation.  Elevated ferritin.  Normal alpha-1 antitrypsin level.  Negative EBV PCR.  -Severe biventricular failure with low EF of 20 %.  Probably congestive  hepatopathy  Recommendation ----------------------- -Underwent IR guided transjugular liver biopsy today -HSV PCR, hemochromatosis gene evaluation and AMA pending -GI will follow   Kathi Der MD, FACP 08/08/2021, 9:13 AM  Contact #  [phone]

## 2021-08-08 NOTE — Progress Notes (Signed)
PT Cancellation Note  Patient Details Name: Erik Chan MRN: [id number] DOB: 12-03-1955   Cancelled Treatment:    Reason Eval/Treat Not Completed: Patient declined, no reason specified, pt declining all mobility despite max encouragement. Will re-attempt tomorrow to continue with PT POC.    Catalina Antigua 08/08/2021, 3:48 PM

## 2021-08-08 NOTE — Sedation Documentation (Signed)
No sedation given during procedure due to patient safety with blood pressures being low.

## 2021-08-08 NOTE — Progress Notes (Signed)
Nutrition Follow-up  DOCUMENTATION CODES:   Severe malnutrition in context of chronic illness  INTERVENTION:   - Recommend GOC discussions regarding nutrition support; Erik Chan with severe malnutrition and extremely poor PO intake despite current interventions including oral nutrition supplements and diet liberalization  - Increase Ensure Enlive po to TID, each supplement provides 350 kcal and 20 grams of protein  - Add Magic Cup TID with meals, each supplement provides 290 kcal and 9 grams of protein  - Agree with Regular diet order  - Continue MVI with minerals daily  NUTRITION DIAGNOSIS:   Severe Malnutrition related to chronic illness (CHF, COPD) as evidenced by severe muscle depletion, severe fat depletion.  Ongoing, being addressed via diet liberalization and oral nutrition supplements  GOAL:   Patient will meet greater than or equal to 90% of their needs  Unmet  MONITOR:   PO intake, Supplement acceptance, Labs, Weight trends  REASON FOR ASSESSMENT:   Consult Assessment of nutrition requirement/status, Diet education  ASSESSMENT:   66 yo male admitted with severe acute hepatitis with jaundice, fatigue, loss of appetite in addition to hyponatremia, AKI. PMH includes CHF, COPD, DM. Erik Chan reports hx of tobacco use, no EtOH use.  06/22 - s/p RHC showing low output biventricular HF and marked volume overload 06/29 - s/p IR liver biopsy  RUQ [place]: ascites, fatty lover, large gallstone, GB sludge CT A/P: bilateral pleural effusions, small ascites, cholelithiasis, sig diverticulosis MRCP: diffuse mesenteric edema with small to moderate volume ascites, cholelithiasis, moderate to large bilateral pleural effusions  Spoke with Erik Chan and family at bedside. Erik Chan communicated with RD but not all answers or comments made sense. Erik Chan reports that all he wants to eat is blueberry yogurt. Erik Chan's family reports that yesterday he ate some baked beans that his wife brought in. He was requesting BBQ  and cole slaw yesterday. Explained that Erik Chan is now on a Regular diet (RD in agreement with diet liberalization) and can consume any food that sounds appetizing. Discouraged limiting food options due to concern about sodium as Erik Chan is unlikely to eat enough of any particular nutrient to cause issues.  Erik Chan's family reports Erik Chan has not been consuming oral nutrition supplements. Noted opened Ensure supplement at bedside. Erik Chan reports that he is drinking 1 Ensure supplement every 24 hours. Encouraged Erik Chan to try to consume more of his supplements and to eat more frequently throughout the day. Encouraged Erik Chan to request items that he feels like he wants to eat.  Given severe malnutrition and extremely poor PO intake, recommend GOC discussions specifically regarding nutrition support. Discussed with MD via secure chat. Plan is to wait for results of liver biopsy from today before making any decisions regarding initiation of nutrition support.  Admit weight: 74.4 kg Current weight: 71.8 kg  Erik Chan with non-pitting edema to BUE and BLE.  Meal Completion: 25-75%  Medications reviewed and include: Ensure Enlive BID, SSI, lactulose 20 grams BID, MVI with minerals, octreotide, klor-con 40 mEq BID, spironolactone, torsemide, milrinone drip  Labs reviewed: sodium 126, elevated LFTs, platelets 97 CBG's: 83-123 x 24 hours  UOP: 1800 ml x 24 hours I/O's: -6.9 ml since admit  Diet Order:   Diet Order             Diet regular Room service appropriate? Yes; Fluid consistency: Thin  Diet effective now                   EDUCATION NEEDS:   Education needs have been addressed  Skin:  Skin Assessment: Skin Integrity Issues: Stage II: sacrum  Last BM:  08/03/21  Height:   Ht Readings from Last 1 Encounters:  07/30/21 6\' 2"  (1.88 m)    Weight:   Wt Readings from Last 1 Encounters:  08/08/21 71.8 kg    BMI:  Body mass index is 20.32 kg/m.  Estimated Nutritional Needs:   Kcal:  2300-2500  kcals  Protein:  120-140 grams  Fluid:  Fluid restriction per MD    08/10/21, MS, RD, LDN Inpatient Clinical Dietitian Please see AMi[facility] for contact information.

## 2021-08-08 NOTE — Progress Notes (Signed)
PROGRESS NOTE    Erik Chan  [id number] DOB: Oct 22, 1955 DOA: 07/30/2021 PCP: Kathyrn Lass, MD  Chief Complaint  Patient presents with   Weakness    Brief Narrative:  66 year old with history of COPD, nonischemic cardiomyopathy with reduced EF 20% comes to the ER with painless jaundice and loss of appetite.  Apparently patient felt very weak on 6/15 and laid on the floor and was unable to get back in her bed.  EMS was called to help with this.  Due to progressive weakness patient was brought to the hospital.  Upon admission patient was noted to be hyponatremic, elevated creatinine, transaminitis.  Ammonia level was normal.  Right upper quadrant ultrasound showed pleural effusion, ascites.  GI was consulted for acute hepatitis along with nephrology and CHF team due to concerns of low output cardiac failure.  Underwent RHC showing biventricular failure and started on milrinone and Lasix.      Assessment & Plan:   Principal Problem:   Acute hepatitis Active Problems:   Acute liver failure   Acute combined systolic and diastolic congestive heart failure (HCC)   Hyponatremia   AKI (acute kidney injury) (HCC)   Paroxysmal atrial fibrillation (HCC)   NSVT (nonsustained ventricular tachycardia) (HCC)   COPD (chronic obstructive pulmonary disease) (HCC)   Pleural effusion   Non-insulin dependent type 2 diabetes mellitus (HCC)   Protein-calorie malnutrition, severe   Chronic systolic CHF (congestive heart failure) (HCC)   Coagulopathy (HCC)   Chronic right heart failure (HCC)   Pressure injury of skin   Assessment and Plan: Acute liver failure Significantly elevated bilirubin, elevated ast/alt  Bili mild downtrend today, follow MRCP with cholelithiasis without intrahepatic or extrahepatic biliary dilation, no findings to suggest choledocholithiasis Negative acute hepatitis panel, negative ANA, negative ASMA, negative a1at Ceruloplasmin mildly elevated Negative EBV DNA  PCR Mitochondrial ab negative, hemochromatosis DNA PCR pending, HSV PCR pending GI c/s, appreciate recs -> most likely etiology congestive hepatopathy, planning for liver bx by IR today S/p transjugular liver bx - follow results   Acute combined systolic and diastolic congestive heart failure ([place]) Right heart cath 6/22 with severe biventricular failure with low output, no significant gradient between RA and HW pressures, suggesting cardiac cirrhosis Echo 05/30/2021 with EF 20-25% 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 vs genetic cardiomyopathy, +/- PVC's Appreciate cardiology assistance Torsemide started today per cardiology, continue milrinone spironolactone Midodrine for BP  Hyponatremia Due to hypervolemia, diuresis per cardiology Renal signed off at this point, expected Na to improve with restoration of euvolemia  NSVT (nonsustained ventricular tachycardia) (HCC) Amiodarone and mexiletine on hold with liver failure  Paroxysmal atrial fibrillation [facility]) Per cardiology No amio given liver failure  AKI (acute kidney injury) ([place]) Per renal, suspected cardiorenal/hepatorenal  octreotide/midodrine Gradually improving Follow with diuresis  COPD (chronic obstructive pulmonary disease) (HCC) No si/sx exacerbation  Pleural effusion CT abd/pelvis with bilateral pleural effusions, small ascites, anasarca Suspected related to volume overload with HF  Non-insulin dependent type 2 diabetes mellitus ([place]) A1c 02/2021 6.7 Continue SSI for now Home jardiance on hold  Protein-calorie malnutrition, severe noted      DVT prophylaxis: SCD Code Status: DNR Family Communication: none at bedside Disposition:   Status is: Inpatient Remains inpatient appropriate because: continued liver failure, worsening   Consultants:  IR Cardiology gastroenterology  Procedures:  Findings:   Hepatic wedge = 26 RA = 22 RV = 57/24  PA = 55/29 (38) PCW  = 26 (v = 34) Fick cardiac  output/index = 3.4/1.8 Therm CO/CI = 2.1/1.0 PVR = 3.2 WU FA sat = 97% PA sat = 59%, 59%  PAPi = 1.2   Assessment: 1. Severe biventricular failure with low output 2. 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 Remains extremely tenuous.   Antimicrobials:  Anti-infectives (From admission, onward)    None       Subjective: Tired, no new complaints  Objective: Vitals:   08/08/21 0935 08/08/21 0940 08/08/21 1128 08/08/21 1518  BP: 103/63 101/60 92/60 95/67   Pulse: 100 100 85 82  Resp: 20 16 14 15   Temp:   97.6 F (36.4 C) 97.6 F (36.4 C)  TempSrc:   Oral Oral  SpO2: 96% 95% 92% 92%  Weight:      Height:        Intake/Output Summary (Last 24 hours) at 08/08/2021 1601 Last data filed at 08/08/2021 0745 Gross per 24 hour  Intake 731.07 ml  Output 900 ml  Net -168.93 ml   Filed Weights   08/07/21 0346 08/08/21 0517 08/08/21 0734  Weight: 74.4 kg 69.2 kg 71.8 kg    Examination:  General: No acute distress. Scleral icterus, jaundiced Cardiovascular: RRR Lungs: unlabored Abdomen: Soft, nontender, nondistended  Neurological: Alert and oriented 3. Moves all extremities 4 with equal strength. Cranial nerves II through XII grossly intact. Skin: Warm and dry. No rashes or lesions. Extremities: No clubbing or cyanosis. No edema.  Data Reviewed: I have personally reviewed following labs and imaging studies  CBC: Recent Labs  Lab 08/02/21 0439 08/03/21 0537 08/05/21 0755 08/06/21 0517 08/08/21 0613  WBC 7.5 8.3 8.2 8.8 9.1  NEUTROABS  --   --   --   --  7.5  HGB 13.4 12.4* 11.2* 12.1* 12.4*  HCT 38.0* 34.9* 31.5* 33.7* 33.5*  MCV 89.8 88.1 87.7 88.2 87.0  PLT 58* 65* 75* 85* 97*    Basic Metabolic Panel: Recent Labs  Lab 08/04/21 0323 08/05/21 0436 08/06/21 0517 08/07/21 0353 08/08/21 0613  NA 122* 123* 121* 122* 126*  K  3.9 3.8 4.3 4.2 3.6  CL 81* 85* 82* 79* 81*  CO2 27 28 29 31  34*  GLUCOSE 85 87 95 87 90  BUN 39* 27* 28* 25* 21  CREATININE 1.19 0.97 0.98 0.92 0.92  CALCIUM 7.8* 7.3* 8.0* 8.2* 8.0*  MG 2.4 2.3 2.3 2.3 2.2  PHOS  --   --   --   --  3.5    GFR: Estimated Creatinine Clearance: 80.2 mL/min (by C-G formula based on SCr of 0.92 mg/dL).  Liver Function Tests: Recent Labs  Lab 08/04/21 0323 08/05/21 0436 08/06/21 0517 08/07/21 0353 08/08/21 0613  AST 116* 119* 144* 157* 148*  ALT 214* 180* 187* 180* 169*  ALKPHOS 80 83 107 151* 142*  BILITOT 26.2* 26.1* 29.2* 30.1* 29.5*  PROT 5.9* 5.4* 6.0* 6.0* 6.0*  ALBUMIN 2.5* 2.4* 2.5* 2.5* 2.4*    CBG: Recent Labs  Lab 08/07/21 1620 08/07/21 2107 08/08/21 0603 08/08/21 1129 08/08/21 1519  GLUCAP 123* 100* 86 90 103*     No results found for this or any previous visit (from the past 240 hour(s)).       Radiology Studies: IR Transcatheter BX  Result Date: 08/08/2021 INDICATION: 66 year old male with liver failure referred for transjugular liver biopsy EXAM: ULTRASOUND-GUIDED RIGHT JUGULAR VEIN ACCESS IMAGE GUIDED TRANSJUGULAR BIOPSY MEDICATIONS: None. ANESTHESIA/SEDATION: Moderate (conscious) sedation was  not employed during this procedure. Total intra-service moderate Sedation Time: 0 minutes. The patient's level of consciousness and vital signs were monitored continuously by radiology nursing throughout the procedure under my direct supervision. COMPLICATIONS: None PROCEDURE: Informed written consent was obtained from the patient after Matthe Sloane thorough discussion of the procedural risks, benefits and alternatives. All questions were addressed. Maximal Sterile Barrier Technique was utilized including caps, mask, sterile gowns, sterile gloves, sterile drape, hand hygiene and skin antiseptic. Tab Rylee timeout was performed prior to the initiation of the procedure. The right neck and chest was prepped with chlorhexidine, and draped in the usual  sterile fashion using maximum barrier technique (cap and mask, sterile gown, sterile gloves, large sterile sheet, hand hygiene and cutaneous antiseptic). Local anesthesia was attained by infiltration with 1% lidocaine without epinephrine. Ultrasound demonstrated patency of the right internal jugular vein, and this was documented with an image. Under real-time ultrasound guidance, this vein was accessed with Brizza Nathanson 21 gauge micropuncture needle and image documentation was performed. Mona Ayars small dermatotomy was made at the access site with an 11 scalpel. Capricia Serda 0.018" wire was advanced into the SVC and the access needle exchanged for Lorah Kalina 17F micropuncture vascular sheath. The 0.018" wire was then removed and Alonnie Bieker 0.035" wire advanced into the IVC. Pura Picinich 9 French sheath was placed over the wire, and Sherisa Gilvin combination of the Bentson wire an angled catheter were used to select the hepatic veins. Position of the catheter was confirmed with small contrast injection. Once the catheter was confirmed within distal hepatic veins, pressure measurements were acquired of the free hepatic vein pressure and Leniya Breit wedge pressure. Once the cannula was positioned, 4 separate 20 gauge biopsy were achieved, placed into saline. Final injection was performed. Patient tolerated the procedure well and remained hemodynamically stable throughout. No complications were encountered and no significant blood loss was encountered. FINDINGS: Wedge pressure: 10 mmHg Free hepatic pressures: 6 mm Hg No evidence of portal hypertension IMPRESSION: Status post ultrasound guided access right internal jugular vein and transjugular liver biopsy. Signed, Dulcy Fanny. Nadene Rubins, RPVI [facility] Radiology Electronically Signed   By: Corrie Mckusick D.O.   On: 08/08/2021 14:00   IR US Guide Vasc Access Right  Result Date: 08/08/2021 INDICATION: 66 year old male with liver failure referred for transjugular liver biopsy EXAM:  ULTRASOUND-GUIDED RIGHT JUGULAR VEIN ACCESS IMAGE GUIDED TRANSJUGULAR BIOPSY MEDICATIONS: None. ANESTHESIA/SEDATION: Moderate (conscious) sedation was not employed during this procedure. Total intra-service moderate Sedation Time: 0 minutes. The patient's level of consciousness and vital signs were monitored continuously by radiology nursing throughout the procedure under my direct supervision. COMPLICATIONS: None PROCEDURE: Informed written consent was obtained from the patient after Ariani Seier thorough discussion of the procedural risks, benefits and alternatives. All questions were addressed. Maximal Sterile Barrier Technique was utilized including caps, mask, sterile gowns, sterile gloves, sterile drape, hand hygiene and skin antiseptic. Veasna Santibanez timeout was performed prior to the initiation of the procedure. The right neck and chest was prepped with chlorhexidine, and draped in the usual sterile fashion using maximum barrier technique (cap and mask, sterile gown, sterile gloves, large sterile sheet, hand hygiene and cutaneous antiseptic). Local anesthesia was attained by infiltration with 1% lidocaine without epinephrine. Ultrasound demonstrated patency of the right internal jugular vein, and this was documented with an image. Under real-time ultrasound guidance, this vein was accessed with Kamylle Axelson 21 gauge micropuncture needle and image documentation was performed. Zitlaly Malson small dermatotomy was made at the access site with an 11 scalpel.  Kelis Plasse 0.018" wire was advanced into the SVC and the access needle exchanged for Briasia Flinders 55F micropuncture vascular sheath. The 0.018" wire was then removed and Lundyn Coste 0.035" wire advanced into the IVC. Blease Capaldi 9 French sheath was placed over the wire, and Oaklie Durrett combination of the Bentson wire an angled catheter were used to select the hepatic veins. Position of the catheter was confirmed with small contrast injection. Once the catheter was confirmed within distal hepatic veins, pressure measurements were acquired of the free  hepatic vein pressure and Tiphani Mells wedge pressure. Once the cannula was positioned, 4 separate 20 gauge biopsy were achieved, placed into saline. Final injection was performed. Patient tolerated the procedure well and remained hemodynamically stable throughout. No complications were encountered and no significant blood loss was encountered. FINDINGS: Wedge pressure: 10 mmHg Free hepatic pressures: 6 mm Hg No evidence of portal hypertension IMPRESSION: Status post ultrasound guided access right internal jugular vein and transjugular liver biopsy. Signed, Dulcy Fanny. Nadene Rubins, RPVI [facility] Radiology Electronically Signed   By: Corrie Mckusick D.O.   On: 08/08/2021 14:00   IR Venogram Hepatic W Hemodynamic Evaluation  Result Date: 08/08/2021 INDICATION: 66 year old male with liver failure referred for transjugular liver biopsy EXAM: ULTRASOUND-GUIDED RIGHT JUGULAR VEIN ACCESS IMAGE GUIDED TRANSJUGULAR BIOPSY MEDICATIONS: None. ANESTHESIA/SEDATION: Moderate (conscious) sedation was not employed during this procedure. Total intra-service moderate Sedation Time: 0 minutes. The patient's level of consciousness and vital signs were monitored continuously by radiology nursing throughout the procedure under my direct supervision. COMPLICATIONS: None PROCEDURE: Informed written consent was obtained from the patient after Hilmer Aliberti thorough discussion of the procedural risks, benefits and alternatives. All questions were addressed. Maximal Sterile Barrier Technique was utilized including caps, mask, sterile gowns, sterile gloves, sterile drape, hand hygiene and skin antiseptic. Kynzleigh Bandel timeout was performed prior to the initiation of the procedure. The right neck and chest was prepped with chlorhexidine, and draped in the usual sterile fashion using maximum barrier technique (cap and mask, sterile gown, sterile gloves, large sterile sheet, hand hygiene and cutaneous antiseptic). Local  anesthesia was attained by infiltration with 1% lidocaine without epinephrine. Ultrasound demonstrated patency of the right internal jugular vein, and this was documented with an image. Under real-time ultrasound guidance, this vein was accessed with Iviona Hole 21 gauge micropuncture needle and image documentation was performed. Nakina Spatz small dermatotomy was made at the access site with an 11 scalpel. Mcclain Shall 0.018" wire was advanced into the SVC and the access needle exchanged for Jasraj Lappe 55F micropuncture vascular sheath. The 0.018" wire was then removed and Dmya Long 0.035" wire advanced into the IVC. Xavia Kniskern 9 French sheath was placed over the wire, and Rhett Mutschler combination of the Bentson wire an angled catheter were used to select the hepatic veins. Position of the catheter was confirmed with small contrast injection. Once the catheter was confirmed within distal hepatic veins, pressure measurements were acquired of the free hepatic vein pressure and Charnice Zwilling wedge pressure. Once the cannula was positioned, 4 separate 20 gauge biopsy were achieved, placed into saline. Final injection was performed. Patient tolerated the procedure well and remained hemodynamically stable throughout. No complications were encountered and no significant blood loss was encountered. FINDINGS: Wedge pressure: 10 mmHg Free hepatic pressures: 6 mm Hg No evidence of portal hypertension IMPRESSION: Status post ultrasound guided access right internal jugular vein and transjugular liver biopsy. Signed, Dulcy Fanny. Nadene Rubins, [place] [facility] Radiology Electronically Signed   By: Corrie Mckusick  D.O.   On: 08/08/2021 14:00        Scheduled Meds:  Chlorhexidine Gluconate Cloth  6 each Topical Daily   digoxin  0.125 mg Oral Daily   feeding supplement  237 mL Oral TID BM   insulin aspart  0-6 Units Subcutaneous TID WC   lactulose  20 g Oral BID   lidocaine       midodrine  10 mg Oral TID WC   multivitamin with minerals  1 tablet Oral  Daily   octreotide  100 mcg Subcutaneous TID   potassium chloride  40 mEq Oral BID   sodium chloride flush  3 mL Intravenous Q12H   sodium chloride flush  3 mL Intravenous Q12H   sodium chloride flush  3 mL Intravenous Q12H   spironolactone  12.5 mg Oral Daily   torsemide  40 mg Oral Daily   Continuous Infusions:  sodium chloride     sodium chloride     milrinone 0.125 mcg/kg/min (08/08/21 0745)     LOS: 9 days    Time spent: 69    Lacretia Nicks, MD [facility]   To contact the attending provider between 7A-7P or the covering provider during after hours 7P-7A, please log into the web site [email or web address] and access using universal [place] password for that web site. If you do not have the password, please call the hospital operator.  08/08/2021, 4:01 PM

## 2021-08-08 NOTE — Plan of Care (Signed)
  Problem: Fluid Volume: Goal: Ability to maintain a balanced intake and output will improve Outcome: Progressing   Problem: Education: Goal: Knowledge of General Education information will improve Description: Including pain rating scale, medication(s)/side effects and non-pharmacologic comfort measures Outcome: Progressing   Problem: Clinical Measurements: Goal: Respiratory complications will improve Outcome: Progressing   Problem: Coping: Goal: Level of anxiety will decrease Outcome: Progressing   Problem: Elimination: Goal: Will not experience complications related to urinary retention Outcome: Progressing   Problem: Pain Managment: Goal: General experience of comfort will improve Outcome: Progressing   Problem: Activity: Goal: Risk for activity intolerance will decrease Outcome: Not Progressing   Problem: Elimination: Goal: Will not experience complications related to bowel motility Outcome: Not Progressing

## 2021-08-08 NOTE — Sedation Documentation (Signed)
Pressures during case are as follows,    Free Hepatic Vein pressure: 6 Wedge pressure: 10 Right heart pressure: 5

## 2021-08-08 NOTE — Progress Notes (Addendum)
Advanced Heart Failure Rounding Note  PCP-Cardiologist: Nicki Guadalajara, MD   Subjective:     06/26: Milrinone cut back to 0.125 mcg   CO-OX 72% on milrinone 0.125  CVP 4-5. Continues on lasix gtt at 12/hr. AM CMET pending.  SBP stable in 90s.   Remains in AF.  Lying comfortably in bed watching TV.  Planning to undergo transjugular liver biopsy today.    RHC  6/22  Hepatic wedge = 26 RA = 22 RV = 57/24  PA = 55/29 (38) PCW = 26 (v = 34) Fick cardiac output/index = 3.4/1.8 Therm CO/CI = 2.1/1.0 PVR = 3.2 WU FA sat = 97% PA sat = 59%, 59%  PAPi = 1.2  Assessment: 1. Severe biventricular failure with low output 2. 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    Objective:   Weight Range: 69.2 kg Body mass index is 19.59 kg/m.   Vital Signs:   Temp:  [97.4 F (36.3 C)-97.9 F (36.6 C)] 97.4 F (36.3 C) (06/29 0517) Pulse Rate:  [90-99] 94 (06/28 1617) Resp:  [16-20] 19 (06/29 0215) BP: (90-99)/(56-66) 93/59 (06/29 0215) SpO2:  [90 %-91 %] 91 % (06/28 1617) Weight:  [69.2 kg] 69.2 kg (06/29 0517) Last BM Date : 08/03/21  Weight change: Filed Weights   08/06/21 0515 08/07/21 0346 08/08/21 0517  Weight: 74 kg 74.4 kg 69.2 kg    Intake/Output:   Intake/Output Summary (Last 24 hours) at 08/08/2021 0705 Last data filed at 08/07/2021 2235 Gross per 24 hour  Intake 480 ml  Output 1800 ml  Net -1320 ml      Physical Exam   General:  No distress. Lying in bed. HEENT: scleral icterus Neck: supple. no JVD. Carotids 2+ bilat; no bruits. L IJ CVC. Cor: PMI nondisplaced. Irregular rate & rhythm. No rubs, gallops or murmurs. Lungs: clear Abdomen: soft, nontender, nondistended.  Extremities: no cyanosis, clubbing, rash, edema Neuro: alert & orientedx3, cranial nerves grossly intact. moves all 4 extremities w/o difficulty. Affect pleasant    Telemetry   AF 90s, ~ 2 PVCs/min   Labs    CBC Recent Labs     08/06/21 0517 08/08/21 0613  WBC 8.8 9.1  NEUTROABS  --  7.5  HGB 12.1* 12.4*  HCT 33.7* 33.5*  MCV 88.2 87.0  PLT 85* 97*   Basic Metabolic Panel Recent Labs    38/25/05 0517 08/07/21 0353  NA 121* 122*  K 4.3 4.2  CL 82* 79*  CO2 29 31  GLUCOSE 95 87  BUN 28* 25*  CREATININE 0.98 0.92  CALCIUM 8.0* 8.2*  MG 2.3 2.3   Liver Function Tests Recent Labs    08/06/21 0517 08/07/21 0353  AST 144* 157*  ALT 187* 180*  ALKPHOS 107 151*  BILITOT 29.2* 30.1*  PROT 6.0* 6.0*  ALBUMIN 2.5* 2.5*   No results for input(s): "LIPASE", "AMYLASE" in the last 72 hours.  Cardiac Enzymes No results for input(s): "CKTOTAL", "CKMB", "CKMBINDEX", "TROPONINI" in the last 72 hours.  BNP: BNP (last 3 results) Recent Labs    02/01/21 1522 02/13/21 1709  BNP 2,411.9* 1,831.1*    ProBNP (last 3 results) No results for input(s): "PROBNP" in the last 8760 hours.   D-Dimer No results for input(s): "DDIMER" in the last 72 hours. Hemoglobin A1C No results for input(s): "HGBA1C" in the last 72 hours. Fasting Lipid Panel No results for input(s): "CHOL", "HDL", "LDLCALC", "TRIG", "CHOLHDL", "LDLDIRECT" in the last  72 hours. Thyroid Function Tests No results for input(s): "TSH", "T4TOTAL", "T3FREE", "THYROIDAB" in the last 72 hours.  Invalid input(s): "FREET3"  Other results:   Imaging    No results found.   Medications:     Scheduled Medications:  Chlorhexidine Gluconate Cloth  6 each Topical Daily   digoxin  0.125 mg Oral Daily   feeding supplement  237 mL Oral BID BM   insulin aspart  0-6 Units Subcutaneous TID WC   lactulose  20 g Oral BID   midodrine  10 mg Oral TID WC   multivitamin with minerals  1 tablet Oral Daily   octreotide  100 mcg Subcutaneous TID   potassium chloride  40 mEq Oral BID   sodium chloride flush  3 mL Intravenous Q12H   sodium chloride flush  3 mL Intravenous Q12H   sodium chloride flush  3 mL Intravenous Q12H   spironolactone  12.5  mg Oral Daily    Infusions:  sodium chloride     sodium chloride     furosemide (LASIX) 200 mg in dextrose 5 % 100 mL (2 mg/mL) infusion 12 mg/hr (08/07/21 1928)   milrinone 0.125 mcg/kg/min (08/08/21 0656)    PRN Medications: sodium chloride, sodium chloride, acetaminophen, albuterol, guaiFENesin, hydrALAZINE, ipratropium-albuterol, ondansetron (ZOFRAN) IV, mouth rinse, senna-docusate, sodium chloride flush, sodium chloride flush, traZODone    Patient Profile    66 y.o. male with history of chronic biventricular HF/NICM, PVCs/NSVT, COPD, hx pleural effusion s/p right thoracentesis 01/23, DM II. Admitted with progressive weakness in setting of elevated LFTs, hyponatremia, AKI and concern for a/c biventricular HF with low-output   Assessment/Plan   1. Chronic Biventricular HFrEF/ NICM - Echo (1/23): severely reduced EF < 20% and severely reduced RV. Moderate to severe MR.  - L/RHC (1/23): moderate non-obstructive CAD. EF < 10% - cMRI (1/23): LVEF 10%, severe biventricular heart failure, basal septal wall mid-wall LGE, apical inferior subendocardial LGE, and RV insertion non specific scar pattern.  - CT chest (1/23): without evidence for pulmonary sarcoidosis.   - Myeloma panel and SPEP/UPEP negative. ACE level OK (37). - Etiology for CM => isolated cardiac sarcoidosis vs prior myocarditis vs genetic cardiomyopathy, +/- PVCs - Cardiac PET previously set up at [facility], has not completed. Genetic testing sent 03/04/21. - Echo 05/30/21 EF 20-25% RV severely reduced.  - Referred to EP for ICD in April - has not scheduled appointment.  - RHC 08/01/21 with low output and elevated filling pressures. - CO-OX stable on milrinone 0.125 mcg.  - CVP 4-5. Stop lasix gtt. Start Torsemide 40 mg daily. - SBP 90s. GDMT limited. Continue midodrine 10 TID. - Continue digoxin 0.125 - Continue spiro 12.5 mg daily - Rv being decongested but no improvement in liver function.   2. Atrial fibrillation - Noted  on tele this admit.  - Rate 90s-100s - Limited rhythm control options. No amiodarone at this point with liver failure - No change  3. Acute liver failure - US abdomen R pleural effusion, + ascites, increased echogenicity of liver, large gallstone with gallbladder sludge. - CT abdomen pelvis - bilateral pleural effusions, small ascites, anasarca, + gallstone - MR abdomen - no focal liver abnormality, cholelithiasis without biliary dilatation, moderate to large b/l pleural effusions, mesenteric edema with ascites, diffuse body wall edema - Hepatitis panel negative. Other serologies pending. - [facility] following. Going for liver biopsy today with IR. - Total Bili 24.6>24.9> 25.1> 26.2 > 26.1>29>30 - AM labs pending   4. Hyponatremia -  Hypervolemic hyponatremia - Na 120>121>122 >123 >121 > 122, today's labs pending - Not a good candidate for Tolvaptan with liver dysfunction - Nephrology following  - Continue RV support with milrinone    5. AKI: - Scr baseline ~ 1, this admit 1.6>1.3 > 1.22 > 1.19 > 0.97 >0.98 > 0.92, today's pending - Follow renal function with diuresis - Midodrine to support BP   6. PVCs/NSVT - >20% PVC burden during admit 01/23 - Rare PVCs today. - Will not restart amiodarone and mexiletine with liver failure - No OSA on sleep study 2/23.   7. COPD - Former heavy smoker. Quit smoking 01/07/21.   8. H/o Pleural Effusion - s/p rt thoracentesis 1/23 w/ 1200 cc fluid removal. 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 No option for heart-liver transplant.   We have optimized his cardiac condition and still no improvement in his liver failure. There is not much more we can do from cardiac standpoint. Prognosis now dependent on his liver disease. Will defer to GI for decision-making as to next steps. Suspect comfort care is only real option     Length of Stay: 9  FINCH, LINDSAY N, PA-C  08/08/2021, 7:05  AM  Advanced Heart Failure Team Pager [phone] (M-F; 7a - 5p)  Please contact [facility] for night-coverage after hours (5p -7a ) and weekends on [email or web address]  Patient seen and examined with the above-signed Advanced Practice Provider and/or Housestaff. I personally reviewed laboratory data, imaging studies and relevant notes. I independently examined the patient and formulated the important aspects of the plan. I have edited the note to reflect any of my changes or salient points. I have personally discussed the plan with the patient and/or family.  Remains on milrinone. Co-ox ok. CVP low. Bili still ~30  Had liver biopsy today. Feels weak   General:  Weak appearing.  Jaundiced No resp difficulty HEENT: normal + scleral icterus  Neck: supple. no JVD. Carotids 2+ bilat; no bruits. No lymphadenopathy or thryomegaly appreciated. Cor: PMI nondisplaced. Regular rate & rhythm. 2/6 TR Lungs: clear Abdomen: soft, nontender, nondistended. No hepatosplenomegaly. No bruits or masses. Good bowel sounds. Extremities: no cyanosis, clubbing, rash, edema Neuro: alert & orientedx3, cranial nerves grossly intact. moves all 4 extremities w/o difficulty. Affect pleasant  Remains on milrinone. Optimized from cardiac standpoint but still with fulminant liver failure. Will continue milrinone for now. Switch diuretics to po. Remains in AF but rate controlled. Not candidate for amio or [facility] with fulminant liver failure. Await results of liver biopsy.  [name], MD  4:44 PM

## 2021-08-09 DIAGNOSIS — B179 Acute viral hepatitis, unspecified: Secondary | ICD-10-CM | POA: Diagnosis not present

## 2021-08-09 LAB — COOXEMETRY PANEL
Carboxyhemoglobin: 3.6 % — ABNORMAL HIGH (ref 0.5–1.5)
Methemoglobin: <0.7 % (ref 0.0–1.5)
O2 Saturation: 68.8 %
Total hemoglobin: 12.2 g/dL (ref 12.0–16.0)

## 2021-08-09 LAB — CBC WITH DIFFERENTIAL/PLATELET
Abs Immature Granulocytes: 0.09 10*3/uL — ABNORMAL HIGH (ref 0.00–0.07)
Basophils Absolute: 0 10*3/uL (ref 0.0–0.1)
Basophils Relative: 0 %
Eosinophils Absolute: 0 10*3/uL (ref 0.0–0.5)
Eosinophils Relative: 0 %
HCT: 33.1 % — ABNORMAL LOW (ref 39.0–52.0)
Hemoglobin: 11.6 g/dL — ABNORMAL LOW (ref 13.0–17.0)
Immature Granulocytes: 1 %
Lymphocytes Relative: 9 %
Lymphs Abs: 0.9 10*3/uL (ref 0.7–4.0)
MCH: 31.3 pg (ref 26.0–34.0)
MCHC: 35 g/dL (ref 30.0–36.0)
MCV: 89.2 fL (ref 80.0–100.0)
Monocytes Absolute: 0.7 10*3/uL (ref 0.1–1.0)
Monocytes Relative: 7 %
Neutro Abs: 7.8 10*3/uL — ABNORMAL HIGH (ref 1.7–7.7)
Neutrophils Relative %: 83 %
Platelets: 100 10*3/uL — ABNORMAL LOW (ref 150–400)
RBC: 3.71 MIL/uL — ABNORMAL LOW (ref 4.22–5.81)
RDW: 20.5 % — ABNORMAL HIGH (ref 11.5–15.5)
Smear Review: NORMAL
WBC: 9.5 10*3/uL (ref 4.0–10.5)
nRBC: 0 % (ref 0.0–0.2)

## 2021-08-09 LAB — COMPREHENSIVE METABOLIC PANEL WITH GFR
ALT: 159 U/L — ABNORMAL HIGH (ref 0–44)
AST: 156 U/L — ABNORMAL HIGH (ref 15–41)
Albumin: 2.2 g/dL — ABNORMAL LOW (ref 3.5–5.0)
Alkaline Phosphatase: 168 U/L — ABNORMAL HIGH (ref 38–126)
Anion gap: 10 (ref 5–15)
BUN: 21 mg/dL (ref 8–23)
CO2: 35 mmol/L — ABNORMAL HIGH (ref 22–32)
Calcium: 8 mg/dL — ABNORMAL LOW (ref 8.9–10.3)
Chloride: 82 mmol/L — ABNORMAL LOW (ref 98–111)
Creatinine, Ser: 1.04 mg/dL (ref 0.61–1.24)
GFR, Estimated: >60 mL/min
Glucose, Bld: 103 mg/dL — ABNORMAL HIGH (ref 70–99)
Potassium: 3.4 mmol/L — ABNORMAL LOW (ref 3.5–5.1)
Sodium: 127 mmol/L — ABNORMAL LOW (ref 135–145)
Total Bilirubin: 26.9 mg/dL (ref 0.3–1.2)
Total Protein: 5.4 g/dL — ABNORMAL LOW (ref 6.5–8.1)

## 2021-08-09 LAB — GLUCOSE, CAPILLARY
Glucose-Capillary: 93 mg/dL (ref 70–99)
Glucose-Capillary: 89 mg/dL (ref 70–99)
Glucose-Capillary: 75 mg/dL (ref 70–99)
Glucose-Capillary: 74 mg/dL (ref 70–99)
Glucose-Capillary: 73 mg/dL (ref 70–99)

## 2021-08-09 LAB — HSV DNA BY PCR (REFERENCE LAB)
HSV 1 DNA: NEGATIVE
HSV 2 DNA: NEGATIVE

## 2021-08-09 LAB — MAGNESIUM: Magnesium: 2.2 mg/dL (ref 1.7–2.4)

## 2021-08-09 LAB — PHOSPHORUS: Phosphorus: 3.9 mg/dL (ref 2.5–4.6)

## 2021-08-09 MED ORDER — POTASSIUM CHLORIDE CRYS ER 20 MEQ PO TBCR
40.0000 meq | EXTENDED_RELEASE_TABLET | Freq: Every day | ORAL | Status: DC
  Administered 2021-08-09 – 2021-08-14 (×5): 40 meq via ORAL
  Filled 2021-08-09 (×6): qty 2

## 2021-08-09 NOTE — Progress Notes (Signed)
Occupational Therapy Treatment Patient Details Name: Erik Chan MRN: [id number] DOB: August 20, 1955 Today's Date: 08/09/2021   History of present illness Pt is a 66 y.o. male admitted 07/30/21 with jaundice, generalized weakness (reports not being able to get up since 07/25/21). 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 S/p RHC 6/22 with biventricular failure, concern for low output. PMH includes HF (EF 20%), NICM, COPD, DM2, fibromyalgia.   OT comments  Patient with incremental progress toward patient focused goals.  Patient able to walk to the hallway this date, with chair follow and one assist.  Continues to complain of fatigue and generalized weakness.  OT can continue efforts in the acute setting, and SNF level rehab trial to determine ultimate discharge disposition recommended.     Recommendations for follow up therapy are one component of a multi-disciplinary discharge planning process, led by the attending physician.  Recommendations may be updated based on patient status, additional functional criteria and insurance authorization.    Follow Up Recommendations  Skilled nursing-short term rehab (<3 hours/day)    Assistance Recommended at Discharge Frequent or constant Supervision/Assistance  Patient can return home with the following  A lot of help with walking and/or transfers;A lot of help with bathing/dressing/bathroom;Assistance with cooking/housework;Direct supervision/assist for medications management;Direct supervision/assist for financial management;Assist for transportation;Help with stairs or ramp for entrance   Equipment Recommendations       Recommendations for Other Services      Precautions / Restrictions Precautions Precautions: Fall Restrictions Weight Bearing Restrictions: No       Mobility Bed Mobility Overal bed mobility: Needs Assistance Bed Mobility: Sit to Supine       Sit to  supine: Min assist        Transfers Overall transfer level: Needs assistance Equipment used: Rolling walker (2 wheels) Transfers: Sit to/from Stand, Bed to chair/wheelchair/BSC Sit to Stand: Mod assist     Step pivot transfers: Min assist           Balance Overall balance assessment: Needs assistance Sitting-balance support: No upper extremity supported, Feet supported Sitting balance-Leahy Scale: Fair     Standing balance support: Reliant on assistive device for balance Standing balance-Leahy Scale: Poor                               Extremity/Trunk Assessment Upper Extremity Assessment Upper Extremity Assessment: Generalized weakness (able to use hands functionally, but will allow for any help given.)   Lower Extremity Assessment Lower Extremity Assessment: Defer to PT evaluation   Cervical / Trunk Assessment Cervical / Trunk Assessment: Kyphotic    Vision       Perception     Praxis      Cognition Arousal/Alertness: Awake/alert Behavior During Therapy: Flat affect Overall Cognitive Status: Impaired/Different from baseline                               Problem Solving: Decreased initiation, Requires verbal cues          Exercises      Shoulder Instructions       General Comments max encouragement for pt to get OOB, continued increased time needed discussing and educating on importance of increased mobility and participation with therapies; pt becoming very agitated but utimately agreeable to stand for peri-care and step over to recliner    Pertinent Vitals/ Pain  Pain Assessment Pain Assessment: No/denies pain Pain Intervention(s): Monitored during session                                                          Frequency  Min 2X/week        Progress Toward Goals  OT Goals(current goals can now be found in the care plan section)     Acute Rehab OT Goals OT Goal Formulation:  With patient Time For Goal Achievement: 08/15/21 Potential to Achieve Goals: Fair  Plan Discharge plan remains appropriate    Co-evaluation                 AM-PAC OT "6 Clicks" Daily Activity     Outcome Measure   Help from another person eating meals?: A Little Help from another person taking care of personal grooming?: A Little Help from another person toileting, which includes using toliet, bedpan, or urinal?: A Lot Help from another person bathing (including washing, rinsing, drying)?: A Lot Help from another person to put on and taking off regular upper body clothing?: A Lot Help from another person to put on and taking off regular lower body clothing?: A Lot 6 Click Score: 14    End of Session Equipment Utilized During Treatment: Gait belt;Rolling walker (2 wheels)  OT Visit Diagnosis: Unsteadiness on feet (R26.81);Other abnormalities of gait and mobility (R26.89);Muscle weakness (generalized) (M62.81)   Activity Tolerance Patient limited by fatigue   Patient Left in bed;with call bell/phone within reach;with family/visitor present   Nurse Communication Mobility status        Time: 7322-0254 OT Time Calculation (min): 21 min  Charges: OT General Charges $OT Visit: 1 Visit OT Evaluation $OT Eval Moderate Complexity: 1 Mod  08/09/2021  RP, OTR/L  Acute Rehabilitation Services  Office:  [phone]   Suzanna Obey 08/09/2021, 1:34 PM

## 2021-08-09 NOTE — Progress Notes (Signed)
MD paged in regards to patient's decreased blood pressure while on milrinone. No new orders received.

## 2021-08-09 NOTE — Progress Notes (Signed)
Mobility Specialist Progress Note    08/09/21 1453  Mobility  Activity Refused mobility   Pt stated he was told he does not have to get up again until Monday. Pt stated he wish everyone would shut up and leave him alone and that he will not get up. Will f/u as schedule permits.   Erik Chan Mobility Specialist

## 2021-08-09 NOTE — Progress Notes (Addendum)
   Palliative Medicine Inpatient Follow Up Note   Chart reviewed this morning.   Liver biopsy completed yesterday - awaiting results.  Heart Failure service continues to treat Roberts biventricular HF - remains on milrinone gtt. Has transitioned from lasix gtt to torsemide orally.   Palliative care will remain involved peripherally for the time being.  Have communicated with Dr. Lowell Guitar via secure chat.  Please do not hesitate to call the team phone if Palliative involvement is needed sooner.  ________________________________________________________________________ Lamarr Lulas [place] Palliative Medicine Team Team Cell Phone: [phone] Please utilize secure chat with additional questions, if there is no response within 30 minutes please call the above phone number  Palliative Medicine Team providers are available by phone from 7am to 7pm daily and can be reached through the team cell phone.  Should this patient require assistance outside of these hours, please call the patient's attending physician.

## 2021-08-09 NOTE — Progress Notes (Signed)
Physical Therapy Treatment Patient Details Name: Erik Chan MRN: [id number] DOB: 10-16-1955 Today's Date: 08/09/2021   History of Present Illness Pt is a 66 y.o. male admitted 07/30/21 with jaundice, generalized weakness (reports not being able to get up since 07/25/21). 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 S/p RHC 6/22 with biventricular failure, concern for low output. PMH includes HF (EF 20%), NICM, COPD, DM2, fibromyalgia.    PT Comments    Pt received supine and agreeable to session with max encouragement and continued education on benefits of OOB mobility. Pt able to perform all bed mobility with min assist and demonstrate transfers with mod a to RW. Pt with poor initiation of standing but once ascent initiated pt able to power up to full standing and maintain for extended time for standing peri-care. Pt demonstrating ability to take a few steps from EOB to recliner with min a to manage RW and lines, pt refusing further ambulation distance. Pt agreeable to time up in chair at end of session. Current plan remains appropriate to address deficits and maximize functional independence and decrease caregiver burden. Pt continues to benefit from skilled PT services to progress toward functional mobility goals.     Recommendations for follow up therapy are one component of a multi-disciplinary discharge planning process, led by the attending physician.  Recommendations may be updated based on patient status, additional functional criteria and insurance authorization.  Follow Up Recommendations  Skilled nursing-short term rehab (<3 hours/day) Can patient physically be transported by private vehicle: Yes   Assistance Recommended at Discharge Intermittent Supervision/Assistance  Patient can return home with the following A lot of help with walking and/or transfers;A lot of help with bathing/dressing/bathroom;Assistance  with cooking/housework;Help with stairs or ramp for entrance;Assist for transportation   Equipment Recommendations  Hospital bed;Wheelchair cushion (measurements PT);Wheelchair (measurements PT)    Recommendations for Other Services       Precautions / Restrictions Precautions Precautions: Fall Restrictions Weight Bearing Restrictions: No     Mobility  Bed Mobility Overal bed mobility: Needs Assistance Bed Mobility: Supine to Sit     Supine to sit: Min assist     General bed mobility comments: min assist to bring LEs to and off bed and to elevate trunk with HHA    Transfers Overall transfer level: Needs assistance Equipment used: Rolling walker (2 wheels) Transfers: Sit to/from Stand Sit to Stand: From elevated surface, Mod assist           General transfer comment: mod assist to power up with increased time and reassurance, once starting to rise able to initiate and come to full stand    Ambulation/Gait Ambulation/Gait assistance: Min assist Gait Distance (Feet): 3 Feet Assistive device: Rolling walker (2 wheels) Gait Pattern/deviations: Step-through pattern, Decreased step length - right, Decreased step length - left, Shuffle, Trunk flexed Gait velocity: decr     General Gait Details: slwo shuffling gait with trunk felxed despite cues to elevate, pt stating "i can't" overall fairly stable with no LOB, pt refusing furhter ambulation distance   Stairs             Wheelchair Mobility    Modified Rankin (Stroke Patients Only)       Balance Overall balance assessment: Needs assistance Sitting-balance support: No upper extremity supported, Feet supported Sitting balance-Leahy Scale: Fair     Standing balance support: Reliant on assistive device for balance Standing balance-Leahy Scale: Poor Standing balance comment: Reliant on RW  Cognition Arousal/Alertness: Awake/alert Behavior During Therapy: Flat  affect, Agitated                                 Problem Solving: Decreased initiation General Comments: contines to demonstrate self-limiting behavior stating "i cant do it" despite demonstrating ablity in previous sessions. argumentative        Exercises      General Comments General comments (skin integrity, edema, etc.): max encouragement for pt to get OOB, continued increased time needed discussing and educating on importance of increased mobility and participation with therapies; pt becoming very agitated but utimately agreeable to stand for peri-care and step over to recliner      Pertinent Vitals/Pain Pain Assessment Pain Assessment: No/denies pain    Home Living                          Prior Function            PT Goals (current goals can now be found in the care plan section) Acute Rehab PT Goals PT Goal Formulation: With patient Time For Goal Achievement: 08/14/21    Frequency    Min 3X/week      PT Plan Current plan remains appropriate    Co-evaluation              AM-PAC PT "6 Clicks" Mobility   Outcome Measure  Help needed turning from your back to your side while in a flat bed without using bedrails?: A Lot Help needed moving from lying on your back to sitting on the side of a flat bed without using bedrails?: A Lot Help needed moving to and from a bed to a chair (including a wheelchair)?: A Lot Help needed standing up from a chair using your arms (e.g., wheelchair or bedside chair)?: A Lot Help needed to walk in hospital room?: A Lot Help needed climbing 3-5 steps with a railing? : Total 6 Click Score: 11    End of Session   Activity Tolerance: Other (comment) (self limiting) Patient left: in chair;with call bell/phone within reach;with family/visitor present Nurse Communication: Mobility status PT Visit Diagnosis: Unsteadiness on feet (R26.81);Muscle weakness (generalized) (M62.81);Difficulty in walking, not  elsewhere classified (R26.2)     Time: 2505-3976 PT Time Calculation (min) (ACUTE ONLY): 29 min  Charges:  $Therapeutic Activity: 23-37 mins                     [name] R. PTA Acute Rehabilitation Services Office: [phone]    Catalina Antigua 08/09/2021, 11:36 AM

## 2021-08-09 NOTE — Progress Notes (Addendum)
Advanced Heart Failure Rounding Note  PCP-Cardiologist: Nicki Guadalajara, MD   Subjective:    06/26: Milrinone cut back to 0.125 mcg. 06/29: Lasix gtt stopped >>> PO Torsemide. Liver biopsy.  CO-OX 69% on 0.125 milrinone.  CVP 2. Continues on po Torsemide.   Scr 1.04 K 3.4 Na trending up, 127 this am  Total bili 29.5>26.9  Liver biopsy results pending.   SBP 80s-90s  Sleepy this am but easily aroused. No complaints.    RHC  6/22  Hepatic wedge = 26 RA = 22 RV = 57/24  PA = 55/29 (38) PCW = 26 (v = 34) Fick cardiac output/index = 3.4/1.8 Therm CO/CI = 2.1/1.0 PVR = 3.2 WU FA sat = 97% PA sat = 59%, 59%  PAPi = 1.2  Assessment: 1. Severe biventricular failure with low output 2. 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    Objective:   Weight Range: 68.6 kg Body mass index is 19.42 kg/m.   Vital Signs:   Temp:  [97.6 F (36.4 C)-97.9 F (36.6 C)] 97.7 F (36.5 C) (06/30 0312) Pulse Rate:  [81-100] 81 (06/30 0300) Resp:  [14-20] 17 (06/30 0300) BP: (83-106)/(44-69) 83/44 (06/30 0312) SpO2:  [90 %-97 %] 90 % (06/30 0300) Weight:  [68.6 kg-71.8 kg] 68.6 kg (06/30 0619) Last BM Date : 08/03/21  Weight change: Filed Weights   08/08/21 0517 08/08/21 0734 08/09/21 0619  Weight: 69.2 kg 71.8 kg 68.6 kg    Intake/Output:   Intake/Output Summary (Last 24 hours) at 08/09/2021 0705 Last data filed at 08/09/2021 0622 Gross per 24 hour  Intake 1331.07 ml  Output 1400 ml  Net -68.93 ml      Physical Exam   General:  Lying comfortably in bed. Thin, ill appearing. HEENT: scleral icterus Neck: supple. no JVD. Carotids 2+ bilat; no bruits. L IJ CVC Cor: PMI nondisplaced. Irregular rate & rhythm. No rubs, gallops or murmurs. Lungs: clear Abdomen: soft, nontender, nondistended.  Extremities: no cyanosis, clubbing, rash, edema Neuro: alert & orientedx3, cranial nerves grossly intact. moves all 4 extremities  w/o difficulty. Affect pleasant     Telemetry   AF 70s-80s   Labs    CBC Recent Labs    08/08/21 0613 08/09/21 0415  WBC 9.1 9.5  NEUTROABS 7.5 7.8*  HGB 12.4* 11.6*  HCT 33.5* 33.1*  MCV 87.0 89.2  PLT 97* 100*   Basic Metabolic Panel Recent Labs    58/83/25 0613 08/09/21 0415  NA 126* 127*  K 3.6 3.4*  CL 81* 82*  CO2 34* 35*  GLUCOSE 90 103*  BUN 21 21  CREATININE 0.92 1.04  CALCIUM 8.0* 8.0*  MG 2.2 2.2  PHOS 3.5 3.9   Liver Function Tests Recent Labs    08/08/21 0613 08/09/21 0415  AST 148* 156*  ALT 169* 159*  ALKPHOS 142* 168*  BILITOT 29.5* 26.9*  PROT 6.0* 5.4*  ALBUMIN 2.4* 2.2*   No results for input(s): "LIPASE", "AMYLASE" in the last 72 hours.  Cardiac Enzymes No results for input(s): "CKTOTAL", "CKMB", "CKMBINDEX", "TROPONINI" in the last 72 hours.  BNP: BNP (last 3 results) Recent Labs    02/01/21 1522 02/13/21 1709  BNP 2,411.9* 1,831.1*    ProBNP (last 3 results) No results for input(s): "PROBNP" in the last 8760 hours.   D-Dimer No results for input(s): "DDIMER" in the last 72 hours. Hemoglobin A1C No results for input(s): "HGBA1C" in the last 72 hours. Fasting Lipid  Panel No results for input(s): "CHOL", "HDL", "LDLCALC", "TRIG", "CHOLHDL", "LDLDIRECT" in the last 72 hours. Thyroid Function Tests No results for input(s): "TSH", "T4TOTAL", "T3FREE", "THYROIDAB" in the last 72 hours.  Invalid input(s): "FREET3"  Other results:   Imaging    IR Transcatheter BX  Result Date: 08/08/2021 INDICATION: 66 year old male with liver failure referred for transjugular liver biopsy EXAM: ULTRASOUND-GUIDED RIGHT JUGULAR VEIN ACCESS IMAGE GUIDED TRANSJUGULAR BIOPSY MEDICATIONS: None. ANESTHESIA/SEDATION: Moderate (conscious) sedation was not employed during this procedure. Total intra-service moderate Sedation Time: 0 minutes. The patient's level of consciousness and vital signs were monitored continuously by radiology nursing  throughout the procedure under my direct supervision. COMPLICATIONS: None PROCEDURE: Informed written consent was obtained from the patient after a thorough discussion of the procedural risks, benefits and alternatives. All questions were addressed. Maximal Sterile Barrier Technique was utilized including caps, mask, sterile gowns, sterile gloves, sterile drape, hand hygiene and skin antiseptic. A timeout was performed prior to the initiation of the procedure. The right neck and chest was prepped with chlorhexidine, and draped in the usual sterile fashion using maximum barrier technique (cap and mask, sterile gown, sterile gloves, large sterile sheet, hand hygiene and cutaneous antiseptic). Local anesthesia was attained by infiltration with 1% lidocaine without epinephrine. Ultrasound demonstrated patency of the right internal jugular vein, and this was documented with an image. Under real-time ultrasound guidance, this vein was accessed with a 21 gauge micropuncture needle and image documentation was performed. A small dermatotomy was made at the access site with an 11 scalpel. A 0.018" wire was advanced into the SVC and the access needle exchanged for a 70F micropuncture vascular sheath. The 0.018" wire was then removed and a 0.035" wire advanced into the IVC. A 9 French sheath was placed over the wire, and a combination of the Bentson wire an angled catheter were used to select the hepatic veins. Position of the catheter was confirmed with small contrast injection. Once the catheter was confirmed within distal hepatic veins, pressure measurements were acquired of the free hepatic vein pressure and a wedge pressure. Once the cannula was positioned, 4 separate 20 gauge biopsy were achieved, placed into saline. Final injection was performed. Patient tolerated the procedure well and remained hemodynamically stable throughout. No complications were encountered and no significant blood loss was encountered. FINDINGS:  Wedge pressure: 10 mmHg Free hepatic pressures: 6 mm Hg No evidence of portal hypertension IMPRESSION: Status post ultrasound guided access right internal jugular vein and transjugular liver biopsy. Signed, Yvone Neu. Miachel Roux, RPVI [facility] Radiology Electronically Signed   By: Gilmer Mor D.O.   On: 08/08/2021 14:00   IR US Guide Vasc Access Right  Result Date: 08/08/2021 INDICATION: 66 year old male with liver failure referred for transjugular liver biopsy EXAM: ULTRASOUND-GUIDED RIGHT JUGULAR VEIN ACCESS IMAGE GUIDED TRANSJUGULAR BIOPSY MEDICATIONS: None. ANESTHESIA/SEDATION: Moderate (conscious) sedation was not employed during this procedure. Total intra-service moderate Sedation Time: 0 minutes. The patient's level of consciousness and vital signs were monitored continuously by radiology nursing throughout the procedure under my direct supervision. COMPLICATIONS: None PROCEDURE: Informed written consent was obtained from the patient after a thorough discussion of the procedural risks, benefits and alternatives. All questions were addressed. Maximal Sterile Barrier Technique was utilized including caps, mask, sterile gowns, sterile gloves, sterile drape, hand hygiene and skin antiseptic. A timeout was performed prior to the initiation of the procedure. The right neck and chest was prepped with chlorhexidine, and draped in the usual  sterile fashion using maximum barrier technique (cap and mask, sterile gown, sterile gloves, large sterile sheet, hand hygiene and cutaneous antiseptic). Local anesthesia was attained by infiltration with 1% lidocaine without epinephrine. Ultrasound demonstrated patency of the right internal jugular vein, and this was documented with an image. Under real-time ultrasound guidance, this vein was accessed with a 21 gauge micropuncture needle and image documentation was performed. A small dermatotomy was made at the access  site with an 11 scalpel. A 0.018" wire was advanced into the SVC and the access needle exchanged for a 58F micropuncture vascular sheath. The 0.018" wire was then removed and a 0.035" wire advanced into the IVC. A 9 French sheath was placed over the wire, and a combination of the Bentson wire an angled catheter were used to select the hepatic veins. Position of the catheter was confirmed with small contrast injection. Once the catheter was confirmed within distal hepatic veins, pressure measurements were acquired of the free hepatic vein pressure and a wedge pressure. Once the cannula was positioned, 4 separate 20 gauge biopsy were achieved, placed into saline. Final injection was performed. Patient tolerated the procedure well and remained hemodynamically stable throughout. No complications were encountered and no significant blood loss was encountered. FINDINGS: Wedge pressure: 10 mmHg Free hepatic pressures: 6 mm Hg No evidence of portal hypertension IMPRESSION: Status post ultrasound guided access right internal jugular vein and transjugular liver biopsy. Signed, Dulcy Fanny. Nadene Rubins, RPVI [facility] Radiology Electronically Signed   By: Corrie Mckusick D.O.   On: 08/08/2021 14:00   IR Venogram Hepatic W Hemodynamic Evaluation  Result Date: 08/08/2021 INDICATION: 66 year old male with liver failure referred for transjugular liver biopsy EXAM: ULTRASOUND-GUIDED RIGHT JUGULAR VEIN ACCESS IMAGE GUIDED TRANSJUGULAR BIOPSY MEDICATIONS: None. ANESTHESIA/SEDATION: Moderate (conscious) sedation was not employed during this procedure. Total intra-service moderate Sedation Time: 0 minutes. The patient's level of consciousness and vital signs were monitored continuously by radiology nursing throughout the procedure under my direct supervision. COMPLICATIONS: None PROCEDURE: Informed written consent was obtained from the patient after a thorough discussion of the  procedural risks, benefits and alternatives. All questions were addressed. Maximal Sterile Barrier Technique was utilized including caps, mask, sterile gowns, sterile gloves, sterile drape, hand hygiene and skin antiseptic. A timeout was performed prior to the initiation of the procedure. The right neck and chest was prepped with chlorhexidine, and draped in the usual sterile fashion using maximum barrier technique (cap and mask, sterile gown, sterile gloves, large sterile sheet, hand hygiene and cutaneous antiseptic). Local anesthesia was attained by infiltration with 1% lidocaine without epinephrine. Ultrasound demonstrated patency of the right internal jugular vein, and this was documented with an image. Under real-time ultrasound guidance, this vein was accessed with a 21 gauge micropuncture needle and image documentation was performed. A small dermatotomy was made at the access site with an 11 scalpel. A 0.018" wire was advanced into the SVC and the access needle exchanged for a 58F micropuncture vascular sheath. The 0.018" wire was then removed and a 0.035" wire advanced into the IVC. A 9 French sheath was placed over the wire, and a combination of the Bentson wire an angled catheter were used to select the hepatic veins. Position of the catheter was confirmed with small contrast injection. Once the catheter was confirmed within distal hepatic veins, pressure measurements were acquired of the free hepatic vein pressure and a wedge pressure. Once the cannula was positioned, 4 separate 20 gauge biopsy were achieved,  placed into saline. Final injection was performed. Patient tolerated the procedure well and remained hemodynamically stable throughout. No complications were encountered and no significant blood loss was encountered. FINDINGS: Wedge pressure: 10 mmHg Free hepatic pressures: 6 mm Hg No evidence of portal hypertension IMPRESSION: Status post ultrasound guided access right internal jugular vein and  transjugular liver biopsy. Signed, Yvone Neu. Miachel Roux, RPVI [facility] Radiology Electronically Signed   By: Gilmer Mor D.O.   On: 08/08/2021 14:00     Medications:     Scheduled Medications:  Chlorhexidine Gluconate Cloth  6 each Topical Daily   digoxin  0.125 mg Oral Daily   feeding supplement  237 mL Oral TID BM   Gerhardt's butt cream   Topical BID   insulin aspart  0-6 Units Subcutaneous TID WC   lactulose  20 g Oral BID   midodrine  10 mg Oral TID WC   multivitamin with minerals  1 tablet Oral Daily   octreotide  100 mcg Subcutaneous TID   potassium chloride  40 mEq Oral BID   sodium chloride flush  3 mL Intravenous Q12H   sodium chloride flush  3 mL Intravenous Q12H   sodium chloride flush  3 mL Intravenous Q12H   spironolactone  12.5 mg Oral Daily   torsemide  40 mg Oral Daily    Infusions:  sodium chloride     sodium chloride     milrinone 0.125 mcg/kg/min (08/08/21 0745)    PRN Medications: sodium chloride, sodium chloride, acetaminophen, albuterol, guaiFENesin, hydrALAZINE, iohexol, ipratropium-albuterol, morphine injection, ondansetron (ZOFRAN) IV, mouth rinse, senna-docusate, sodium chloride flush, sodium chloride flush, traZODone    Patient Profile    66 y.o. male with history of chronic biventricular HF/NICM, PVCs/NSVT, COPD, hx pleural effusion s/p right thoracentesis 01/23, DM II. Admitted with progressive weakness in setting of elevated LFTs, hyponatremia, AKI and concern for a/c biventricular HF with low-output   Assessment/Plan   1. Chronic Biventricular HFrEF/ NICM - Echo (1/23): severely reduced EF < 20% and severely reduced RV. Moderate to severe MR.  - L/RHC (1/23): moderate non-obstructive CAD. EF < 10% - cMRI (1/23): LVEF 10%, severe biventricular heart failure, basal septal wall mid-wall LGE, apical inferior subendocardial LGE, and RV insertion non specific scar pattern.  - CT chest  (1/23): without evidence for pulmonary sarcoidosis.   - Myeloma panel and SPEP/UPEP negative. ACE level OK (37). - Etiology for CM => isolated cardiac sarcoidosis vs prior myocarditis vs genetic cardiomyopathy, +/- PVCs - Cardiac PET previously set up at [facility], has not completed. Genetic testing sent 03/04/21. - Echo 05/30/21 EF 20-25% RV severely reduced.  - Referred to EP for ICD in April - has not scheduled appointment.  - RHC 08/01/21 with low output and elevated filling pressures. - CO-OX stable on milrinone 0.125 mcg.  - CVP 2. Stop Torsemide. - SBP soft. Held diuretics. Can increase midodrine to 15 TID if needed. - Continue digoxin 0.125 - Continue spiro 12.5 mg daily - HF optimized but continues with liver failure. Remains on milrinone for now.   2. Atrial fibrillation - Noted on tele this admit.  - Rate 90s-100s - Limited rhythm control options. No amiodarone d/t liver failure - No change  3. Acute liver failure - US abdomen R pleural effusion, + ascites, increased echogenicity of liver, large gallstone with gallbladder sludge. - CT abdomen pelvis - bilateral pleural effusions, small ascites, anasarca, + gallstone - MR abdomen - no focal liver  abnormality, cholelithiasis without biliary dilatation, moderate to large b/l pleural effusions, mesenteric edema with ascites, diffuse body wall edema - Hepatitis panel negative. ANA negative. Several serologies still pending. - [facility] following. S/p liver biopsy 06/29. Results pending. - Total Bili 24.6>24.9>25.1>26.2>26.1>29>30>27   4. Hyponatremia - Hypervolemic hyponatremia - Na 120>121>122 >123 >121 >122>126>127, now improving after diuresis - Not a good candidate for Tolvaptan with liver dysfunction - Continue RV support with milrinone    5. AKI: - Scr baseline ~ 1, up to 1.6 this admit.  - Now back to baseline.  - Follow renal function with diuretics - Midodrine to support BP   6. PVCs/NSVT - >20% PVC burden during admit  01/23 - Rare PVCs on tele - Will not restart amiodarone and mexiletine with liver failure - No OSA on sleep study 2/23.   7. COPD - Former heavy smoker. Quit smoking 01/07/21.   8. H/o Pleural Effusion - s/p rt thoracentesis 1/23 w/ 1200 cc fluid removal. - moderate to large b/l pleural effusions on MR this admit  9. 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 No option for heart-liver transplant.  - Palliative Care on board - Now DNR     Length of Stay: [place], Lynder Parents, PA-C  08/09/2021, 7:05 AM  Advanced Heart Failure Team Pager [phone] (M-F; 7a - 5p)  Please contact [facility] for night-coverage after hours (5p -7a ) and weekends on [email or web address]  Patient seen and examined with the above-signed Advanced Practice Provider and/or Housestaff. I personally reviewed laboratory data, imaging studies and relevant notes. I independently examined the patient and formulated the important aspects of the plan. I have edited the note to reflect any of my changes or salient points. I have personally discussed the plan with the patient and/or family.  Underwent liver biopsy yesterday. Results bending. Remains on milrinone and oral torsemide. CVP 2. Co-ox ok.   Bili 29 -> 26. Sleepy but arousable. Denies CP, SOB, orthopnea  General:  Jaundiced. No resp difficulty HEENT: normal + sclearl icterus  Neck: supple. no JVD. Carotids 2+ bilat; no bruits. No lymphadenopathy or thryomegaly appreciated. Cor: PMI nondisplaced. Regular rate & rhythm. Soft TR Lungs: clear Abdomen: soft, nontender, nondistended. No hepatosplenomegaly. No bruits or masses. Good bowel sounds. Extremities: no cyanosis, clubbing, rash, edema Neuro: alert & orientedx3, cranial nerves grossly intact. moves all 4 extremities w/o difficulty. Affect sleepy but arousable  He is optimized from HF standpoint. Bili trending down a bit today. Will continue milrinone throughout the weekend. Await  results of liver biopsy. Hold diuretics.   The HF team will see again on Monday unless called.   [name], MD  8:44 AM

## 2021-08-09 NOTE — Plan of Care (Signed)
  Problem: Activity: Goal: Risk for activity intolerance will decrease Outcome: Not Progressing   

## 2021-08-09 NOTE — Progress Notes (Signed)
[facility] Progress Note  Erik Chan 66 y.o. 08-Jun-1955  CC: Abnormal LFTs   Subjective: Patient seen and examined at bedside.  Continues to feel fatigued and weak.  No acute GI issues.  ROS : Afebrile, negative for chest pain   Objective: Vital signs in last 24 hours: Vitals:   08/09/21 0312 08/09/21 0803  BP: (!) 83/44 (!) 97/54  Pulse:  87  Resp:  20  Temp: 97.7 F (36.5 C) 97.6 F (36.4 C)  SpO2:  92%    Physical Exam:  General:  Alert, cooperative, no distress, appears stated age  Head:  Normocephalic, without obvious abnormality, atraumatic  Eyes:  , EOM's intact,   Lungs:   No visible respiratory distress  Heart:  Regular rate and rhythm, S1, S2 normal  Abdomen:   Soft, non-tender, minimal abdominal distention, bowel sounds present, no peritoneal sign          Lab Results: Recent Labs    08/08/21 0613 08/09/21 0415  NA 126* 127*  K 3.6 3.4*  CL 81* 82*  CO2 34* 35*  GLUCOSE 90 103*  BUN 21 21  CREATININE 0.92 1.04  CALCIUM 8.0* 8.0*  MG 2.2 2.2  PHOS 3.5 3.9   Recent Labs    08/08/21 0613 08/09/21 0415  AST 148* 156*  ALT 169* 159*  ALKPHOS 142* 168*  BILITOT 29.5* 26.9*  PROT 6.0* 5.4*  ALBUMIN 2.4* 2.2*   Recent Labs    08/08/21 0613 08/09/21 0415  WBC 9.1 9.5  NEUTROABS 7.5 7.8*  HGB 12.4* 11.6*  HCT 33.5* 33.1*  MCV 87.0 89.2  PLT 97* 100*   No results for input(s): "LABPROT", "INR" in the last 72 hours.    Assessment/Plan: -Abnormal LFTs.  Patient with mild elevated LFTs since December 2022 but now having significant jaundice.  Work-up negative so far except for significantly elevated ferritin which could be from acute phase reactant. -Negative hepatitis panel.  Normal ANA and ASMA.  Mildly elevated ceruloplasmin.  Normal iron saturation.  Elevated ferritin.  Normal alpha-1 antitrypsin level.  Negative EBV PCR.  -Severe biventricular failure with low EF of 20 %.  Probably congestive  hepatopathy  Recommendation ----------------------- -Mild improvement in T. bili today. -Liver biopsy pathology pending -HSV PCR, hemochromatosis gene evaluation pending. AMA negative -GI will follow   Kathi Der MD, FACP 08/09/2021, 10:23 AM  Contact #  [phone]

## 2021-08-10 ENCOUNTER — Inpatient Hospital Stay: Payer: Self-pay

## 2021-08-10 ENCOUNTER — Inpatient Hospital Stay (HOSPITAL_COMMUNITY): Payer: Medicare Other

## 2021-08-10 DIAGNOSIS — Z515 Encounter for palliative care: Secondary | ICD-10-CM | POA: Diagnosis not present

## 2021-08-10 DIAGNOSIS — B179 Acute viral hepatitis, unspecified: Secondary | ICD-10-CM | POA: Diagnosis not present

## 2021-08-10 LAB — CBC WITH DIFFERENTIAL/PLATELET
Abs Immature Granulocytes: 0.09 10*3/uL — ABNORMAL HIGH (ref 0.00–0.07)
Basophils Absolute: 0 10*3/uL (ref 0.0–0.1)
Basophils Relative: 1 %
Eosinophils Absolute: 0 10*3/uL (ref 0.0–0.5)
Eosinophils Relative: 1 %
HCT: 33 % — ABNORMAL LOW (ref 39.0–52.0)
Hemoglobin: 11.6 g/dL — ABNORMAL LOW (ref 13.0–17.0)
Immature Granulocytes: 1 %
Lymphocytes Relative: 10 %
Lymphs Abs: 0.8 10*3/uL (ref 0.7–4.0)
MCH: 30.9 pg (ref 26.0–34.0)
MCHC: 35.2 g/dL (ref 30.0–36.0)
MCV: 87.8 fL (ref 80.0–100.0)
Monocytes Absolute: 0.6 10*3/uL (ref 0.1–1.0)
Monocytes Relative: 7 %
Neutro Abs: 6.7 10*3/uL (ref 1.7–7.7)
Neutrophils Relative %: 80 %
Platelets: 98 10*3/uL — ABNORMAL LOW (ref 150–400)
RBC: 3.76 MIL/uL — ABNORMAL LOW (ref 4.22–5.81)
RDW: 20.4 % — ABNORMAL HIGH (ref 11.5–15.5)
WBC: 8.3 10*3/uL (ref 4.0–10.5)
nRBC: 0 % (ref 0.0–0.2)

## 2021-08-10 LAB — GLUCOSE, CAPILLARY
Glucose-Capillary: 72 mg/dL (ref 70–99)
Glucose-Capillary: 65 mg/dL — ABNORMAL LOW (ref 70–99)
Glucose-Capillary: 68 mg/dL — ABNORMAL LOW (ref 70–99)
Glucose-Capillary: 98 mg/dL (ref 70–99)
Glucose-Capillary: 102 mg/dL — ABNORMAL HIGH (ref 70–99)
Glucose-Capillary: 86 mg/dL (ref 70–99)

## 2021-08-10 LAB — PHOSPHORUS: Phosphorus: 4.3 mg/dL (ref 2.5–4.6)

## 2021-08-10 LAB — COMPREHENSIVE METABOLIC PANEL
ALT: 158 U/L — ABNORMAL HIGH (ref 0–44)
AST: 160 U/L — ABNORMAL HIGH (ref 15–41)
Albumin: 2.1 g/dL — ABNORMAL LOW (ref 3.5–5.0)
Alkaline Phosphatase: 181 U/L — ABNORMAL HIGH (ref 38–126)
Anion gap: 11 (ref 5–15)
BUN: 20 mg/dL (ref 8–23)
CO2: 34 mmol/L — ABNORMAL HIGH (ref 22–32)
Calcium: 7.9 mg/dL — ABNORMAL LOW (ref 8.9–10.3)
Chloride: 81 mmol/L — ABNORMAL LOW (ref 98–111)
Creatinine, Ser: 0.97 mg/dL (ref 0.61–1.24)
GFR, Estimated: >60 mL/min (ref >60)
Glucose, Bld: 74 mg/dL (ref 70–99)
Potassium: 3 mmol/L — ABNORMAL LOW (ref 3.5–5.1)
Sodium: 126 mmol/L — ABNORMAL LOW (ref 135–145)
Total Bilirubin: 24 mg/dL (ref 0.3–1.2)
Total Protein: 5.2 g/dL — ABNORMAL LOW (ref 6.5–8.1)

## 2021-08-10 LAB — MAGNESIUM: Magnesium: 2.1 mg/dL (ref 1.7–2.4)

## 2021-08-10 LAB — COOXEMETRY PANEL
Carboxyhemoglobin: 3.3 % — ABNORMAL HIGH (ref 0.5–1.5)
Methemoglobin: <0.7 % (ref 0.0–1.5)
O2 Saturation: 82.3 %
Total hemoglobin: 12.1 g/dL (ref 12.0–16.0)

## 2021-08-10 LAB — PROTIME-INR
INR: 1.4 — ABNORMAL HIGH (ref 0.8–1.2)
Prothrombin Time: 17.1 seconds — ABNORMAL HIGH (ref 11.4–15.2)

## 2021-08-10 LAB — DIGOXIN LEVEL: Digoxin Level: 0.6 ng/mL — ABNORMAL LOW (ref 0.8–2.0)

## 2021-08-10 MED ORDER — POTASSIUM CHLORIDE CRYS ER 20 MEQ PO TBCR
40.0000 meq | EXTENDED_RELEASE_TABLET | Freq: Once | ORAL | Status: AC
  Administered 2021-08-10: 40 meq via ORAL
  Filled 2021-08-10: qty 2

## 2021-08-10 NOTE — Progress Notes (Signed)
Peripherally Inserted Central Catheter Placement  The IV Nurse has discussed with the patient and/or persons authorized to consent for the patient, the purpose of this procedure and the potential benefits and risks involved with this procedure.  The benefits include less needle sticks, lab draws from the catheter, and the patient may be discharged home with the catheter. Risks include, but not limited to, infection, bleeding, blood clot (thrombus formation), and puncture of an artery; nerve damage and irregular heartbeat and possibility to perform a PICC exchange if needed/ordered by physician.  Alternatives to this procedure were also discussed.  Bard Power PICC patient education guide, fact sheet on infection prevention and patient information card has been provided to patient /or left at bedside.    PICC Placement Documentation  PICC Double Lumen 08/10/21 Right Basilic 40 cm 0 cm (Active)  Indication for Insertion or Continuance of Line Vasoactive infusions 08/10/21 1206  Exposed Catheter (cm) 0 cm 08/10/21 1206  Site Assessment Clean, Dry, Intact 08/10/21 1206  Lumen #1 Status Flushed;Blood return noted;Saline locked 08/10/21 1206  Lumen #2 Status Flushed;Blood return noted;Saline locked 08/10/21 1206  Dressing Type Transparent 08/10/21 1206  Dressing Status Antimicrobial disc in place 08/10/21 1206  Dressing Change Due 08/17/21 08/10/21 1206       [name] 08/10/2021, 12:08 PM

## 2021-08-10 NOTE — Progress Notes (Signed)
Palliative Medicine Inpatient Follow Up Note  HPI: 66 y.o. male  with past medical history of biventricular failure, tobacco abuse, COPD, diabetes type 2, and nonischemic cardiomyopathy (EF 20%) admitted on 07/30/2021 with fatigue, loss of appetite, and painless jaundice.  Patient is being treated for hyponatremia, AKI, hyperbilirubin and EF, elevated LFTs (MELD score 32).  MRCP revealed cholelithiasis without biliary dilation revealed.  Right heart cath to be performed today.  Palliative medicine team was consulted to discuss goals of care given patient's overall poor prognosis.  Today's Discussion 08/10/2021  *Please note that this is a verbal dictation therefore any spelling or grammatical errors are due to the "[place] [facility]" system interpretation.  Chart reviewed inclusive of vital signs, progress notes, laboratory results, and diagnostic images.   I met with Erik Chan this morning.  He and I had a long discussion about a variety of topics.  First and foremost we discussed [name]'s end-stage heart failure and he is understanding that nothing more can be offered by the cardiology team.  He is also elected to change his CODE STATUS to DO NOT RESUSCITATE.  We lamented that this was a very reasonable decision under the given circumstances.  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  He shares that he is used to being in control and is having a hard time with his present prognosis.  Erik Chan shares with me that he also worked in Public affairs consultant and reviews over the years the various tasks that he is completed as well as roles he is assumed.  Through conversation it is obvious that he is a very proud and had been a very independent man.  He shares with me his views on politics and legislation.  He goes on to share with me some of his personal conspiracy theory believes.  Erik Chan and I  discussed his extreme faith and how he is always maintained a role as a good [place] man.  He shares that he is not been that consistently while in the hospital though realizes the burdens healthcare providers are facing throughout their day-to-day.  He goes on to share that he understands as he tries to help care for his wife who has both good and bad days as well as his mother who is in her 43s.  Erik Chan does express the desire to continue living even under his given circumstances and shares in an ideal world he would like to live until 76.  He circles back to awaiting the liver biopsy results and shares that his whole family is awaiting this.  In regards to reasons for having a strong desire to live [name] has 2 grandchildren ages 25 and 38 who he absolutely adores and gets great joy out of.  Palliative support provided.   Objective Assessment: Vital Signs Vitals:   08/10/21 0255 08/10/21 0800  BP: (!) 92/52 (!) 93/54  Pulse: 84 70  Resp: 16 14  Temp: 98 F (36.7 C) 98 F (36.7 C)  SpO2: 95% 93%    Intake/Output Summary (Last 24 hours) at 08/10/2021 1001 Last data filed at 08/10/2021 0800 Gross per 24 hour  Intake 1640 ml  Output 2075 ml  Net -435 ml    Last Weight  Most recent update: 08/10/2021  6:29 AM    Weight  67.3 kg (148 lb 5.9 oz)  Gen: Very frail Caucasian male in no acute distress HEENT: moist mucous membranes CV: Regular rate and rhythm PULM: On room air breathing is even and nonlabored ABD: soft/nontender EXT: Bilateral lower extremity edema Neuro: Alert and oriented x3  SUMMARY OF RECOMMENDATIONS   DNAR/DNI  Liver biopsy result is pending  [place] appears to be progressing to the acceptance stage of grieving  We will request spiritual support given [place] strong Christian values  Ongoing incremental palliative care support will be provided  Billing based on MDM: High ______ [facility] Team Team Cell  Phone: [phone] Please utilize secure chat with additional questions, if there is no response within 30 minutes please call the above phone number  Palliative Medicine Team providers are available by phone from 7am to 7pm daily and can be reached through the team cell phone.  Should this patient require assistance outside of these hours, please call the patient's attending physician.

## 2021-08-10 NOTE — Assessment & Plan Note (Signed)
Placed 6/22 Will plan for switching to PICC

## 2021-08-10 NOTE — Plan of Care (Signed)
  Problem: Coping: Goal: Ability to adjust to condition or change in health will improve Outcome: Progressing   Problem: Fluid Volume: Goal: Ability to maintain a balanced intake and output will improve Outcome: Progressing   Problem: Health Behavior/Discharge Planning: Goal: Ability to identify and utilize available resources and services will improve Outcome: Progressing   Problem: Metabolic: Goal: Ability to maintain appropriate glucose levels will improve Outcome: Progressing   Problem: Clinical Measurements: Goal: Respiratory complications will improve Outcome: Progressing   Problem: Coping: Goal: Level of anxiety will decrease Outcome: Progressing   Problem: Elimination: Goal: Will not experience complications related to urinary retention Outcome: Progressing   Problem: Pain Managment: Goal: General experience of comfort will improve Outcome: Progressing   Problem: Activity: Goal: Risk for activity intolerance will decrease Outcome: Not Progressing

## 2021-08-10 NOTE — Progress Notes (Signed)
Pt's blood sugar was 65 and 68, pt is refusing to eat food, it is hard to convince pt to drink juice and ensure, he is eating very minimum and appetite is very low, recent blood sugar is 98 after reinforcing pt to drink orange juice, and some of the ensure. His wife is in bed side and also encouraging pt to eat, she brought some food from outside for the pt to see if his appetite will change but pt's appetite is very minimum this time.  Pt is refusing to check CBG and he wants long time gap between blood sugar check even it is low. Because of that it is hard to follow the protocol of low CBG. MD Lowell Guitar is aware of his recent CBG.  Will continue to monitor the pt.   Lonia Farber, RN

## 2021-08-10 NOTE — Progress Notes (Signed)
Patient discussed with Dr. Danae Chen slight decrease awaiting liver biopsy please call me this weekend if I can be of any assistance otherwise we will ask rounding team to check on when biopsy results return

## 2021-08-10 NOTE — Assessment & Plan Note (Signed)
Pressure Injury 07/31/21 Sacrum Stage 2 -  Partial thickness loss of dermis presenting as Malana Eberwein shallow open injury with Sergio Hobart red, pink wound bed without slough. (Active)  07/31/21 1830  Location: Sacrum  Location Orientation:   Staging: Stage 2 -  Partial thickness loss of dermis presenting as Sinda Leedom shallow open injury with Bodi Palmeri red, pink wound bed without slough.  Wound Description (Comments):   Present on Admission: Yes

## 2021-08-10 NOTE — Progress Notes (Signed)
PROGRESS NOTE    Erik [facility]  [id number] DOB: 1955-10-16 DOA: 07/30/2021 PCP: Erik Lass, MD  Chief Complaint  Patient presents with   Weakness    Brief Narrative:  66 year old with history of COPD, nonischemic cardiomyopathy with reduced EF 20% comes to the ER with painless jaundice and loss of appetite.  Apparently patient felt very weak on 6/15 and laid on the floor and was unable to get back in her bed.  EMS was called to help with this.  Due to progressive weakness patient was brought to the [facility].  Upon admission patient was noted to be hyponatremic, elevated creatinine, transaminitis.  Ammonia level was normal.  Right upper quadrant ultrasound showed pleural effusion, ascites.  GI was consulted for acute hepatitis along with nephrology and CHF team due to concerns of low output cardiac failure.  Underwent RHC showing biventricular failure and started on milrinone and Lasix.      Assessment & Plan:   Principal Problem:   Acute hepatitis Active Problems:   Acute liver failure   Acute combined systolic and diastolic congestive heart failure (HCC)   Hyponatremia   AKI (acute kidney injury) (HCC)   Paroxysmal atrial fibrillation (HCC)   NSVT (nonsustained ventricular tachycardia) (HCC)   COPD (chronic obstructive pulmonary disease) (HCC)   Pleural effusion   Non-insulin dependent type 2 diabetes mellitus (HCC)   Protein-calorie malnutrition, severe   Status post PICC central line placement   Pressure ulcer   Chronic systolic CHF (congestive heart failure) (HCC)   Coagulopathy (HCC)   Chronic right heart failure (HCC)   Assessment and Plan: Acute liver failure Significantly elevated bilirubin, elevated ast/alt  Bili continued downtrend today, follow MRCP with cholelithiasis without intrahepatic or extrahepatic biliary dilation, no findings to suggest choledocholithiasis Negative acute hepatitis panel, negative ANA, negative ASMA, negative a1at Ceruloplasmin  mildly elevated Negative EBV DNA PCR Mitochondrial ab negative, hemochromatosis DNA PCR pending, HSV PCR pending GI c/s, appreciate recs -> most likely etiology congestive hepatopathy, planning for liver bx by IR today S/p transjugular liver bx - follow results - pending   Acute combined systolic and diastolic congestive heart failure ([facility]) Right heart cath 6/22 with severe biventricular failure with low output, no significant gradient between RA and HW pressures, suggesting cardiac cirrhosis Echo 05/30/2021 with EF 20-25% 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 vs genetic cardiomyopathy, +/- PVC's Appreciate cardiology assistance continue milrinone per cards, loop on hold per cards spironolactone Midodrine for BP  Hyponatremia Due to hypervolemia, diuresis per cardiology Renal signed off at this point, expected Na to improve with restoration of euvolemia  NSVT (nonsustained ventricular tachycardia) (HCC) Amiodarone and mexiletine on hold with liver failure  Paroxysmal atrial fibrillation [facility]) Per cardiology No amio given liver failure  AKI (acute kidney injury) ([facility]) Per renal, suspected cardiorenal/hepatorenal  octreotide/midodrine Gradually improving Follow with diuresis  COPD (chronic obstructive pulmonary disease) (HCC) No si/sx exacerbation  Pleural effusion CT abd/pelvis with bilateral pleural effusions, small ascites, anasarca Suspected related to volume overload with HF  Non-insulin dependent type 2 diabetes mellitus ([facility]) A1c 02/2021 6.7 Continue SSI for now Home jardiance on hold  Protein-calorie malnutrition, severe noted  Status post PICC central line placement Placed 6/22 Will plan for switching to PICC  Pressure ulcer Pressure Injury 07/31/21 Sacrum Stage 2 -  Partial thickness loss of dermis presenting as [facility] shallow open injury with [facility] red, pink wound bed without slough. (Active)  07/31/21 1830  Location: Sacrum   Location Orientation:  Staging: Stage 2 -  Partial thickness loss of dermis presenting as [facility] shallow open injury with [name] red, pink wound bed without slough.  Wound Description (Comments):   Present on Admission: Yes          DVT prophylaxis: SCD Code Status: DNR Family Communication: none at bedside Disposition:   Status is: Inpatient Remains inpatient appropriate because: continued liver failure, worsening   Consultants:  IR Cardiology gastroenterology  Procedures:  Findings:   Hepatic wedge = 26 RA = 22 RV = 57/24  PA = 55/29 (38) PCW = 26 (v = 34) Fick cardiac output/index = 3.4/1.8 Therm CO/CI = 2.1/1.0 PVR = 3.2 WU FA sat = 97% PA sat = 59%, 59%  PAPi = 1.2   Assessment: 1. Severe biventricular failure with low output 2. 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 Remains extremely tenuous.   Antimicrobials:  Anti-infectives (From admission, onward)    None       Subjective: No new complaints  Objective: Vitals:   08/10/21 0016 08/10/21 0255 08/10/21 0628 08/10/21 0800  BP: 97/61 (!) 92/52  (!) 93/54  Pulse: 87 84  70  Resp: 19 16  14   Temp: 98 F (36.7 C) 98 F (36.7 C)  98 F (36.7 C)  TempSrc: Oral Oral  Oral  SpO2: 94% 95%  93%  Weight:   67.3 kg   Height:        Intake/Output Summary (Last 24 hours) at 08/10/2021 0938 Last data filed at 08/10/2021 0800 Gross per 24 hour  Intake 1640 ml  Output 2075 ml  Net -435 ml   Filed Weights   08/08/21 0734 08/09/21 0619 08/10/21 0628  Weight: 71.8 kg 68.6 kg 67.3 kg    Examination:  General: No acute distress. Scleral icterus, jaundiced Cardiovascular: RRR Lungs: unlabored Neurological: Alert and oriented 3. Moves all extremities 4 with equal strength. Cranial nerves II through XII grossly intact. Extremities: No clubbing or cyanosis. No edema.   Data Reviewed: I have  personally reviewed following labs and imaging studies  CBC: Recent Labs  Lab 08/05/21 0755 08/06/21 0517 08/08/21 0613 08/09/21 0415 08/10/21 0444  WBC 8.2 8.8 9.1 9.5 8.3  NEUTROABS  --   --  7.5 7.8* 6.7  HGB 11.2* 12.1* 12.4* 11.6* 11.6*  HCT 31.5* 33.7* 33.5* 33.1* 33.0*  MCV 87.7 88.2 87.0 89.2 87.8  PLT 75* 85* 97* 100* 98*    Basic Metabolic Panel: Recent Labs  Lab 08/06/21 0517 08/07/21 0353 08/08/21 0613 08/09/21 0415 08/10/21 0444  NA 121* 122* 126* 127* 126*  K 4.3 4.2 3.6 3.4* 3.0*  CL 82* 79* 81* 82* 81*  CO2 29 31 34* 35* 34*  GLUCOSE 95 87 90 103* 74  BUN 28* 25* 21 21 20   CREATININE 0.98 0.92 0.92 1.04 0.97  CALCIUM 8.0* 8.2* 8.0* 8.0* 7.9*  MG 2.3 2.3 2.2 2.2 2.1  PHOS  --   --  3.5 3.9 4.3    GFR: Estimated Creatinine Clearance: 71.3 mL/min (by C-G formula based on SCr of 0.97 mg/dL).  Liver Function Tests: Recent Labs  Lab 08/06/21 0517 08/07/21 0353 08/08/21 0613 08/09/21 0415 08/10/21 0444  AST 144* 157* 148* 156* 160*  ALT 187* 180* 169* 159* 158*  ALKPHOS 107 151* 142* 168* 181*  BILITOT 29.2* 30.1* 29.5* 26.9* 24.0*  PROT 6.0* 6.0* 6.0* 5.4* 5.2*  ALBUMIN 2.5* 2.5*  2.4* 2.2* 2.1*    CBG: Recent Labs  Lab 08/09/21 1122 08/09/21 1716 08/09/21 2128 08/09/21 2224 08/10/21 0626  GLUCAP 89 75 74 73 72     No results found for this or any previous visit (from the past 240 hour(s)).       Radiology Studies: [place] EKG SITE RITE  Result Date: 08/10/2021 If Site Rite image not attached, placement could not be confirmed due to current cardiac rhythm.  IR Transcatheter BX  Result Date: 08/08/2021 INDICATION: 66 year old male with liver failure referred for transjugular liver biopsy EXAM: ULTRASOUND-GUIDED RIGHT JUGULAR VEIN ACCESS IMAGE GUIDED TRANSJUGULAR BIOPSY MEDICATIONS: None. ANESTHESIA/SEDATION: Moderate (conscious) sedation was not employed during this procedure. Total intra-service moderate Sedation Time: 0 minutes.  The patient's level of consciousness and vital signs were monitored continuously by radiology nursing throughout the procedure under my direct supervision. COMPLICATIONS: None PROCEDURE: Informed written consent was obtained from the patient after Deaglan Lile thorough discussion of the procedural risks, benefits and alternatives. All questions were addressed. Maximal Sterile Barrier Technique was utilized including caps, mask, sterile gowns, sterile gloves, sterile drape, hand hygiene and skin antiseptic. Royden Bulman timeout was performed prior to the initiation of the procedure. The right neck and chest was prepped with chlorhexidine, and draped in the usual sterile fashion using maximum barrier technique (cap and mask, sterile gown, sterile gloves, large sterile sheet, hand hygiene and cutaneous antiseptic). Local anesthesia was attained by infiltration with 1% lidocaine without epinephrine. Ultrasound demonstrated patency of the right internal jugular vein, and this was documented with an image. Under real-time ultrasound guidance, this vein was accessed with Lamoyne Palencia 21 gauge micropuncture needle and image documentation was performed. Catera Hankins small dermatotomy was made at the access site with an 11 scalpel. Buren Havey 0.018" wire was advanced into the SVC and the access needle exchanged for Adriona Kaney 74F micropuncture vascular sheath. The 0.018" wire was then removed and Jessabelle Markiewicz 0.035" wire advanced into the IVC. Albie Arizpe 9 French sheath was placed over the wire, and Genea Rheaume combination of the Bentson wire an angled catheter were used to select the hepatic veins. Position of the catheter was confirmed with small contrast injection. Once the catheter was confirmed within distal hepatic veins, pressure measurements were acquired of the free hepatic vein pressure and Martavis Gurney wedge pressure. Once the cannula was positioned, 4 separate 20 gauge biopsy were achieved, placed into saline. Final injection was performed. Patient tolerated the procedure well and remained hemodynamically stable  throughout. No complications were encountered and no significant blood loss was encountered. FINDINGS: Wedge pressure: 10 mmHg Free hepatic pressures: 6 mm Hg No evidence of portal hypertension IMPRESSION: Status post ultrasound guided access right internal jugular vein and transjugular liver biopsy. Signed, Dulcy Fanny. Nadene Rubins, RPVI [facility] Radiology Electronically Signed   By: Corrie Mckusick D.O.   On: 08/08/2021 14:00   IR US Guide Vasc Access Right  Result Date: 08/08/2021 INDICATION: 66 year old male with liver failure referred for transjugular liver biopsy EXAM: ULTRASOUND-GUIDED RIGHT JUGULAR VEIN ACCESS IMAGE GUIDED TRANSJUGULAR BIOPSY MEDICATIONS: None. ANESTHESIA/SEDATION: Moderate (conscious) sedation was not employed during this procedure. Total intra-service moderate Sedation Time: 0 minutes. The patient's level of consciousness and vital signs were monitored continuously by radiology nursing throughout the procedure under my direct supervision. COMPLICATIONS: None PROCEDURE: Informed written consent was obtained from the patient after Chanelle Hodsdon thorough discussion of the procedural risks, benefits and alternatives. All questions were addressed. Maximal Sterile Barrier Technique was utilized including caps, mask, sterile gowns,  sterile gloves, sterile drape, hand hygiene and skin antiseptic. Margret Moat timeout was performed prior to the initiation of the procedure. The right neck and chest was prepped with chlorhexidine, and draped in the usual sterile fashion using maximum barrier technique (cap and mask, sterile gown, sterile gloves, large sterile sheet, hand hygiene and cutaneous antiseptic). Local anesthesia was attained by infiltration with 1% lidocaine without epinephrine. Ultrasound demonstrated patency of the right internal jugular vein, and this was documented with an image. Under real-time ultrasound guidance, this vein was accessed with Cherese Lozano 21 gauge  micropuncture needle and image documentation was performed. Abi Shoults small dermatotomy was made at the access site with an 11 scalpel. Sible Straley 0.018" wire was advanced into the SVC and the access needle exchanged for Bianna Haran 20F micropuncture vascular sheath. The 0.018" wire was then removed and Tarrie Mcmichen 0.035" wire advanced into the IVC. Hamdi Kley 9 French sheath was placed over the wire, and Ashon Rosenberg combination of the Bentson wire an angled catheter were used to select the hepatic veins. Position of the catheter was confirmed with small contrast injection. Once the catheter was confirmed within distal hepatic veins, pressure measurements were acquired of the free hepatic vein pressure and Rocklin Soderquist wedge pressure. Once the cannula was positioned, 4 separate 20 gauge biopsy were achieved, placed into saline. Final injection was performed. Patient tolerated the procedure well and remained hemodynamically stable throughout. No complications were encountered and no significant blood loss was encountered. FINDINGS: Wedge pressure: 10 mmHg Free hepatic pressures: 6 mm Hg No evidence of portal hypertension IMPRESSION: Status post ultrasound guided access right internal jugular vein and transjugular liver biopsy. Signed, Yvone Neu. Miachel Roux, RPVI [facility] Radiology Electronically Signed   By: Gilmer Mor D.O.   On: 08/08/2021 14:00   IR Venogram Hepatic W Hemodynamic Evaluation  Result Date: 08/08/2021 INDICATION: 66 year old male with liver failure referred for transjugular liver biopsy EXAM: ULTRASOUND-GUIDED RIGHT JUGULAR VEIN ACCESS IMAGE GUIDED TRANSJUGULAR BIOPSY MEDICATIONS: None. ANESTHESIA/SEDATION: Moderate (conscious) sedation was not employed during this procedure. Total intra-service moderate Sedation Time: 0 minutes. The patient's level of consciousness and vital signs were monitored continuously by radiology nursing throughout the procedure under my direct supervision. COMPLICATIONS: None  PROCEDURE: Informed written consent was obtained from the patient after Avalyn Molino thorough discussion of the procedural risks, benefits and alternatives. All questions were addressed. Maximal Sterile Barrier Technique was utilized including caps, mask, sterile gowns, sterile gloves, sterile drape, hand hygiene and skin antiseptic. Nyasia Baxley timeout was performed prior to the initiation of the procedure. The right neck and chest was prepped with chlorhexidine, and draped in the usual sterile fashion using maximum barrier technique (cap and mask, sterile gown, sterile gloves, large sterile sheet, hand hygiene and cutaneous antiseptic). Local anesthesia was attained by infiltration with 1% lidocaine without epinephrine. Ultrasound demonstrated patency of the right internal jugular vein, and this was documented with an image. Under real-time ultrasound guidance, this vein was accessed with Lajada Janes 21 gauge micropuncture needle and image documentation was performed. Kristyn Obyrne small dermatotomy was made at the access site with an 11 scalpel. Mylisa Brunson 0.018" wire was advanced into the SVC and the access needle exchanged for Dejae Bernet 20F micropuncture vascular sheath. The 0.018" wire was then removed and Adonna Horsley 0.035" wire advanced into the IVC. Shuayb Schepers 9 French sheath was placed over the wire, and Rene Sizelove combination of the Bentson wire an angled catheter were used to select the hepatic veins. Position of the catheter was confirmed with small contrast injection. Once  the catheter was confirmed within distal hepatic veins, pressure measurements were acquired of the free hepatic vein pressure and Neizan Debruhl wedge pressure. Once the cannula was positioned, 4 separate 20 gauge biopsy were achieved, placed into saline. Final injection was performed. Patient tolerated the procedure well and remained hemodynamically stable throughout. No complications were encountered and no significant blood loss was encountered. FINDINGS: Wedge pressure: 10 mmHg Free hepatic pressures: 6 mm Hg No evidence of  portal hypertension IMPRESSION: Status post ultrasound guided access right internal jugular vein and transjugular liver biopsy. Signed, Yvone Neu. Miachel Roux, RPVI [facility] Radiology Electronically Signed   By: Gilmer Mor D.O.   On: 08/08/2021 14:00        Scheduled Meds:  Chlorhexidine Gluconate Cloth  6 each Topical Daily   digoxin  0.125 mg Oral Daily   feeding supplement  237 mL Oral TID BM   Gerhardt's butt cream   Topical BID   insulin aspart  0-6 Units Subcutaneous TID WC   lactulose  20 g Oral BID   midodrine  10 mg Oral TID WC   multivitamin with minerals  1 tablet Oral Daily   octreotide  100 mcg Subcutaneous TID   potassium chloride  40 mEq Oral Daily   potassium chloride  40 mEq Oral Once   sodium chloride flush  3 mL Intravenous Q12H   sodium chloride flush  3 mL Intravenous Q12H   sodium chloride flush  3 mL Intravenous Q12H   spironolactone  12.5 mg Oral Daily   Continuous Infusions:  sodium chloride     sodium chloride     milrinone 0.125 mcg/kg/min (08/09/21 1935)     LOS: 11 days    Time spent: 21    Lacretia Nicks, MD [facility]   To contact the attending provider between 7A-7P or the covering provider during after hours 7P-7A, please log into the web site [email or web address] and access using universal [place] password for that web site. If you do not have the password, please call the [facility] operator.  08/10/2021, 9:38 AM

## 2021-08-11 DIAGNOSIS — B179 Acute viral hepatitis, unspecified: Secondary | ICD-10-CM | POA: Diagnosis not present

## 2021-08-11 LAB — CBC WITH DIFFERENTIAL/PLATELET
Abs Immature Granulocytes: 0.09 10*3/uL — ABNORMAL HIGH (ref 0.00–0.07)
Basophils Absolute: 0.1 10*3/uL (ref 0.0–0.1)
Basophils Relative: 1 %
Eosinophils Absolute: 0.1 10*3/uL (ref 0.0–0.5)
Eosinophils Relative: 1 %
HCT: 34.7 % — ABNORMAL LOW (ref 39.0–52.0)
Hemoglobin: 11.9 g/dL — ABNORMAL LOW (ref 13.0–17.0)
Immature Granulocytes: 1 %
Lymphocytes Relative: 11 %
Lymphs Abs: 0.9 10*3/uL (ref 0.7–4.0)
MCH: 30.6 pg (ref 26.0–34.0)
MCHC: 34.3 g/dL (ref 30.0–36.0)
MCV: 89.2 fL (ref 80.0–100.0)
Monocytes Absolute: 0.5 10*3/uL (ref 0.1–1.0)
Monocytes Relative: 6 %
Neutro Abs: 6.4 10*3/uL (ref 1.7–7.7)
Neutrophils Relative %: 80 %
Platelets: 101 10*3/uL — ABNORMAL LOW (ref 150–400)
RBC: 3.89 MIL/uL — ABNORMAL LOW (ref 4.22–5.81)
RDW: 20.8 % — ABNORMAL HIGH (ref 11.5–15.5)
WBC: 7.9 10*3/uL (ref 4.0–10.5)
nRBC: 0 % (ref 0.0–0.2)

## 2021-08-11 LAB — GLUCOSE, CAPILLARY
Glucose-Capillary: 92 mg/dL (ref 70–99)
Glucose-Capillary: 95 mg/dL (ref 70–99)
Glucose-Capillary: 79 mg/dL (ref 70–99)
Glucose-Capillary: 77 mg/dL (ref 70–99)

## 2021-08-11 LAB — COMPREHENSIVE METABOLIC PANEL
ALT: 161 U/L — ABNORMAL HIGH (ref 0–44)
AST: 171 U/L — ABNORMAL HIGH (ref 15–41)
Albumin: 2.1 g/dL — ABNORMAL LOW (ref 3.5–5.0)
Alkaline Phosphatase: 192 U/L — ABNORMAL HIGH (ref 38–126)
Anion gap: 11 (ref 5–15)
BUN: 15 mg/dL (ref 8–23)
CO2: 34 mmol/L — ABNORMAL HIGH (ref 22–32)
Calcium: 8.1 mg/dL — ABNORMAL LOW (ref 8.9–10.3)
Chloride: 81 mmol/L — ABNORMAL LOW (ref 98–111)
Creatinine, Ser: 0.86 mg/dL (ref 0.61–1.24)
GFR, Estimated: >60 mL/min (ref >60)
Glucose, Bld: 104 mg/dL — ABNORMAL HIGH (ref 70–99)
Potassium: 3.1 mmol/L — ABNORMAL LOW (ref 3.5–5.1)
Sodium: 126 mmol/L — ABNORMAL LOW (ref 135–145)
Total Bilirubin: 23.4 mg/dL (ref 0.3–1.2)
Total Protein: 5.4 g/dL — ABNORMAL LOW (ref 6.5–8.1)

## 2021-08-11 LAB — MAGNESIUM: Magnesium: 2.3 mg/dL (ref 1.7–2.4)

## 2021-08-11 LAB — COOXEMETRY PANEL
Carboxyhemoglobin: 3.2 % — ABNORMAL HIGH (ref 0.5–1.5)
Methemoglobin: <0.7 % (ref 0.0–1.5)
O2 Saturation: 70.7 %
Total hemoglobin: 12.5 g/dL (ref 12.0–16.0)

## 2021-08-11 LAB — PHOSPHORUS: Phosphorus: 4.2 mg/dL (ref 2.5–4.6)

## 2021-08-11 MED ORDER — POTASSIUM CHLORIDE CRYS ER 20 MEQ PO TBCR
60.0000 meq | EXTENDED_RELEASE_TABLET | Freq: Once | ORAL | Status: AC
  Administered 2021-08-11: 60 meq via ORAL
  Filled 2021-08-11: qty 3

## 2021-08-11 NOTE — Progress Notes (Signed)
PROGRESS NOTE    Erik Chan  NLG:[id number] DOB: 1955-02-25 DOA: 07/30/2021 PCP: [name], MD  Chief Complaint  Patient presents with   Weakness    Brief Narrative:  66 year old with history of COPD, nonischemic cardiomyopathy with reduced EF 20% comes to the ER with painless jaundice and loss of appetite.  Apparently patient felt very weak on 6/15 and laid on the floor and was unable to get back in her bed.  EMS was called to help with this.  Due to progressive weakness patient was brought to the hospital.  Upon admission patient was noted to be hyponatremic, elevated creatinine, transaminitis.  Ammonia level was normal.  Right upper quadrant ultrasound showed pleural effusion, ascites.  GI was consulted for acute hepatitis along with nephrology and CHF team due to concerns of low output cardiac failure.  Underwent RHC showing biventricular failure and started on milrinone and Lasix.      Assessment & Plan:   Principal Problem:   Acute hepatitis Active Problems:   Acute liver failure   Acute combined systolic and diastolic congestive heart failure (HCC)   Hyponatremia   AKI (acute kidney injury) (HCC)   Paroxysmal atrial fibrillation (HCC)   NSVT (nonsustained ventricular tachycardia) (HCC)   COPD (chronic obstructive pulmonary disease) (HCC)   Pleural effusion   Non-insulin dependent type 2 diabetes mellitus (HCC)   Protein-calorie malnutrition, severe   Status post PICC central line placement   Pressure ulcer   Chronic systolic CHF (congestive heart failure) (HCC)   Coagulopathy (HCC)   Chronic right heart failure (HCC)   Assessment and Plan: Acute liver failure Significantly elevated bilirubin, elevated ast/alt  Bili continued downtrend today, follow MRCP with cholelithiasis without intrahepatic or extrahepatic biliary dilation, no findings to suggest choledocholithiasis Negative acute hepatitis panel, negative ANA, negative ASMA, negative a1at Ceruloplasmin  mildly elevated Negative EBV DNA PCR Mitochondrial ab negative, hemochromatosis DNA PCR pending, HSV PCR negative GI c/s, appreciate recs -> 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 - follow results - pending   Acute combined systolic and diastolic congestive heart failure (HCC) Right heart cath 6/22 with severe biventricular failure with low output, no significant gradient between RA and HW pressures, suggesting cardiac cirrhosis Echo 05/30/2021 with EF 20-25% 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 vs genetic cardiomyopathy, +/- PVC's Appreciate cardiology assistance continue milrinone per cards, loop on hold per cards spironolactone Midodrine for BP  Hyponatremia Due to hypervolemia, diuresis per cardiology Renal signed off at this point, expected Na to improve with restoration of euvolemia  NSVT (nonsustained ventricular tachycardia) (HCC) Amiodarone and mexiletine on hold with liver failure  Paroxysmal atrial fibrillation [facility]) Per cardiology No amio given liver failure  AKI (acute kidney injury) (HCC) Per renal, suspected cardiorenal/hepatorenal  octreotide/midodrine Gradually improving Follow with diuresis  COPD (chronic obstructive pulmonary disease) (HCC) No si/sx exacerbation  Pleural effusion CT abd/pelvis with bilateral pleural effusions, small ascites, anasarca Suspected related to volume overload with HF  Non-insulin dependent type 2 diabetes mellitus (HCC) A1c 02/2021 6.7 BG's on lower side - hold SSI, follow BG's Home jardiance on hold  Protein-calorie malnutrition, severe noted  Status post PICC central line placement Left IJ 6/22 Left IJ removed 7/1 PICC placed 7/1  Pressure ulcer Pressure Injury 07/31/21 Sacrum Stage 2 -  Partial thickness loss of dermis presenting as Kaly Mcquary shallow open injury with Quyen Cutsforth red, pink wound bed without slough. (Active)  07/31/21 1830  Location: Sacrum   Location Orientation:  Staging: Stage 2 -  Partial thickness loss of dermis presenting as Lavora Brisbon shallow open injury with Michall Noffke red, pink wound bed without slough.  Wound Description (Comments):   Present on Admission: Yes          DVT prophylaxis: SCD Code Status: DNR Family Communication: none at bedside Disposition:   Status is: Inpatient Remains inpatient appropriate because: continued liver failure, worsening   Consultants:  IR Cardiology gastroenterology  Procedures:  Findings:   Hepatic wedge = 26 RA = 22 RV = 57/24  PA = 55/29 (38) PCW = 26 (v = 34) Fick cardiac output/index = 3.4/1.8 Therm CO/CI = 2.1/1.0 PVR = 3.2 WU FA sat = 97% PA sat = 59%, 59%  PAPi = 1.2   Assessment: 1. Severe biventricular failure with low output 2. 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 Remains extremely tenuous.   Antimicrobials:  Anti-infectives (From admission, onward)    None       Subjective: No new complaints  Objective: Vitals:   08/11/21 0700 08/11/21 0827 08/11/21 1055 08/11/21 1100  BP: (!) 95/57 (!) 103/54 101/67 (!) 106/59  Pulse: 72 71 75 77  Resp: 17 11 20 20   Temp: 97.7 F (36.5 C) 97.7 F (36.5 C) 97.9 F (36.6 C) 97.9 F (36.6 C)  TempSrc: Axillary Axillary Oral Oral  SpO2: 94% 93% 95% 91%  Weight:      Height:        Intake/Output Summary (Last 24 hours) at 08/11/2021 1207 Last data filed at 08/11/2021 1110 Gross per 24 hour  Intake 680 ml  Output 1100 ml  Net -420 ml   Filed Weights   08/09/21 0619 08/10/21 0628 08/11/21 0619  Weight: 68.6 kg 67.3 kg 71.5 kg    Examination:  General: No acute distress. Scleral icterus, jaundice Cardiovascular: RRR Lungs: unlabored Neurological: Alert and oriented 3. Moves all extremities 4 with equal strength. Cranial nerves II through XII grossly intact. Extremities: No clubbing or cyanosis.  No edema.  Data Reviewed: I have personally reviewed following labs and imaging studies  CBC: Recent Labs  Lab 08/06/21 0517 08/08/21 0613 08/09/21 0415 08/10/21 0444 08/11/21 0559  WBC 8.8 9.1 9.5 8.3 7.9  NEUTROABS  --  7.5 7.8* 6.7 6.4  HGB 12.1* 12.4* 11.6* 11.6* 11.9*  HCT 33.7* 33.5* 33.1* 33.0* 34.7*  MCV 88.2 87.0 89.2 87.8 89.2  PLT 85* 97* 100* 98* 101*    Basic Metabolic Panel: Recent Labs  Lab 08/07/21 0353 08/08/21 0613 08/09/21 0415 08/10/21 0444 08/11/21 0559  NA 122* 126* 127* 126* 126*  K 4.2 3.6 3.4* 3.0* 3.1*  CL 79* 81* 82* 81* 81*  CO2 31 34* 35* 34* 34*  GLUCOSE 87 90 103* 74 104*  BUN 25* 21 21 20 15   CREATININE 0.92 0.92 1.04 0.97 0.86  CALCIUM 8.2* 8.0* 8.0* 7.9* 8.1*  MG 2.3 2.2 2.2 2.1 2.3  PHOS  --  3.5 3.9 4.3 4.2    GFR: Estimated Creatinine Clearance: 85.4 mL/min (by C-G formula based on SCr of 0.86 mg/dL).  Liver Function Tests: Recent Labs  Lab 08/07/21 0353 08/08/21 0613 08/09/21 0415 08/10/21 0444 08/11/21 0559  AST 157* 148* 156* 160* 171*  ALT 180* 169* 159* 158* 161*  ALKPHOS 151* 142* 168* 181* 192*  BILITOT 30.1* 29.5* 26.9* 24.0* 23.4*  PROT 6.0* 6.0* 5.4* 5.2* 5.4*  ALBUMIN 2.5* 2.4* 2.2* 2.1*  2.1*    CBG: Recent Labs  Lab 08/10/21 1709 08/10/21 1920 08/10/21 2108 08/11/21 0618 08/11/21 1054  GLUCAP 98 102* 86 92 95     No results found for this or any previous visit (from the past 240 hour(s)).       Radiology Studies: DG Chest Port 1 View  Result Date: 08/10/2021 CLINICAL DATA:  PICC in central line placement. EXAM: PORTABLE CHEST 1 VIEW COMPARISON:  02/22/2021 and 02/21/2021 radiographs.  CT 02/22/2021. FINDINGS: 1225 hours. Right arm PICC projects to the superior cavoatrial junction. Left IJ central venous catheter projects to the lower SVC level. The heart size and mediastinal contours are stable. There are probable small bilateral pleural effusions with associated bibasilar atelectasis. No  pneumothorax or confluent airspace opacity. The bones appear unremarkable. Telemetry leads overlie the chest. IMPRESSION: Satisfactory position of the PICC and central lines as described. No pneumothorax. Probable small bilateral pleural effusions. Electronically Signed   By: Richardean Sale M.D.   On: 08/10/2021 12:47   [place] EKG SITE RITE  Result Date: 08/10/2021 If Site Rite image not attached, placement could not be confirmed due to current cardiac rhythm.       Scheduled Meds:  Chlorhexidine Gluconate Cloth  6 each Topical Daily   digoxin  0.125 mg Oral Daily   feeding supplement  237 mL Oral TID BM   Gerhardt's butt cream   Topical BID   lactulose  20 g Oral BID   midodrine  10 mg Oral TID WC   multivitamin with minerals  1 tablet Oral Daily   octreotide  100 mcg Subcutaneous TID   potassium chloride  40 mEq Oral Daily   sodium chloride flush  3 mL Intravenous Q12H   sodium chloride flush  3 mL Intravenous Q12H   sodium chloride flush  3 mL Intravenous Q12H   spironolactone  12.5 mg Oral Daily   Continuous Infusions:  sodium chloride     sodium chloride     milrinone 0.125 mcg/kg/min (08/10/21 1642)     LOS: 12 days    Time spent: [place], MD [facility]   To contact the attending provider between 7A-7P or the covering provider during after hours 7P-7A, please log into the web site [email or web address] and access using universal [place] password for that web site. If you do not have the password, please call the hospital operator.  08/11/2021, 12:07 PM

## 2021-08-11 NOTE — Plan of Care (Signed)

## 2021-08-11 NOTE — Progress Notes (Signed)
Mobility Specialist Progress Note    08/11/21 1730  Mobility  Activity Refused mobility   Pt stated he was told he did not have to get up today and that he needed to rest because has not gotten any rest the past few days. Will f/u as schedule permits.   [place] Mobility Specialist

## 2021-08-11 NOTE — Plan of Care (Signed)
  Problem: Fluid Volume: Goal: Ability to maintain a balanced intake and output will improve Outcome: Progressing   Problem: Tissue Perfusion: Goal: Adequacy of tissue perfusion will improve Outcome: Progressing   Problem: Clinical Measurements: Goal: Respiratory complications will improve Outcome: Progressing   Problem: Coping: Goal: Level of anxiety will decrease Outcome: Progressing   Problem: Elimination: Goal: Will not experience complications related to urinary retention Outcome: Progressing   Problem: Pain Managment: Goal: General experience of comfort will improve Outcome: Progressing   Problem: Nutritional: Goal: Maintenance of adequate nutrition will improve Outcome: Not Progressing   Problem: Skin Integrity: Goal: Risk for impaired skin integrity will decrease Outcome: Not Progressing   Problem: Activity: Goal: Risk for activity intolerance will decrease Outcome: Not Progressing

## 2021-08-12 DIAGNOSIS — Z515 Encounter for palliative care: Secondary | ICD-10-CM | POA: Diagnosis not present

## 2021-08-12 DIAGNOSIS — B179 Acute viral hepatitis, unspecified: Secondary | ICD-10-CM | POA: Diagnosis not present

## 2021-08-12 DIAGNOSIS — I5041 Acute combined systolic (congestive) and diastolic (congestive) heart failure: Secondary | ICD-10-CM | POA: Diagnosis not present

## 2021-08-12 DIAGNOSIS — Z7189 Other specified counseling: Secondary | ICD-10-CM | POA: Diagnosis not present

## 2021-08-12 LAB — HEMOCHROMATOSIS DNA-PCR(C282Y,H63D)

## 2021-08-12 LAB — COMPREHENSIVE METABOLIC PANEL
ALT: 168 U/L — ABNORMAL HIGH (ref 0–44)
AST: 173 U/L — ABNORMAL HIGH (ref 15–41)
Albumin: 2.1 g/dL — ABNORMAL LOW (ref 3.5–5.0)
Alkaline Phosphatase: 226 U/L — ABNORMAL HIGH (ref 38–126)
Anion gap: 11 (ref 5–15)
BUN: 13 mg/dL (ref 8–23)
CO2: 32 mmol/L (ref 22–32)
Calcium: 8 mg/dL — ABNORMAL LOW (ref 8.9–10.3)
Chloride: 82 mmol/L — ABNORMAL LOW (ref 98–111)
Creatinine, Ser: 0.78 mg/dL (ref 0.61–1.24)
GFR, Estimated: >60 mL/min (ref >60)
Glucose, Bld: 99 mg/dL (ref 70–99)
Potassium: 3.6 mmol/L (ref 3.5–5.1)
Sodium: 125 mmol/L — ABNORMAL LOW (ref 135–145)
Total Bilirubin: 23.3 mg/dL (ref 0.3–1.2)
Total Protein: 5.7 g/dL — ABNORMAL LOW (ref 6.5–8.1)

## 2021-08-12 LAB — PHOSPHORUS: Phosphorus: 3.7 mg/dL (ref 2.5–4.6)

## 2021-08-12 LAB — COOXEMETRY PANEL
Carboxyhemoglobin: 3.3 % — ABNORMAL HIGH (ref 0.5–1.5)
Carboxyhemoglobin: 3.6 % — ABNORMAL HIGH (ref 0.5–1.5)
Methemoglobin: <0.7 % (ref 0.0–1.5)
Methemoglobin: 0.8 % (ref 0.0–1.5)
O2 Saturation: 88 %
O2 Saturation: 94 %
Total hemoglobin: 13 g/dL (ref 12.0–16.0)
Total hemoglobin: 12.6 g/dL (ref 12.0–16.0)

## 2021-08-12 LAB — CBC WITH DIFFERENTIAL/PLATELET
Abs Immature Granulocytes: 0.09 10*3/uL — ABNORMAL HIGH (ref 0.00–0.07)
Basophils Absolute: 0 10*3/uL (ref 0.0–0.1)
Basophils Relative: 0 %
Eosinophils Absolute: 0.1 10*3/uL (ref 0.0–0.5)
Eosinophils Relative: 1 %
HCT: 34.7 % — ABNORMAL LOW (ref 39.0–52.0)
Hemoglobin: 12.7 g/dL — ABNORMAL LOW (ref 13.0–17.0)
Immature Granulocytes: 1 %
Lymphocytes Relative: 13 %
Lymphs Abs: 1 10*3/uL (ref 0.7–4.0)
MCH: 32.1 pg (ref 26.0–34.0)
MCHC: 36.6 g/dL — ABNORMAL HIGH (ref 30.0–36.0)
MCV: 87.6 fL (ref 80.0–100.0)
Monocytes Absolute: 0.5 10*3/uL (ref 0.1–1.0)
Monocytes Relative: 6 %
Neutro Abs: 6.4 10*3/uL (ref 1.7–7.7)
Neutrophils Relative %: 79 %
Platelets: 105 10*3/uL — ABNORMAL LOW (ref 150–400)
RBC: 3.96 MIL/uL — ABNORMAL LOW (ref 4.22–5.81)
RDW: 21.3 % — ABNORMAL HIGH (ref 11.5–15.5)
Smear Review: NORMAL
WBC: 8.1 10*3/uL (ref 4.0–10.5)
nRBC: 0 % (ref 0.0–0.2)

## 2021-08-12 LAB — MAGNESIUM: Magnesium: 2.2 mg/dL (ref 1.7–2.4)

## 2021-08-12 LAB — GLUCOSE, CAPILLARY
Glucose-Capillary: 79 mg/dL (ref 70–99)
Glucose-Capillary: 69 mg/dL — ABNORMAL LOW (ref 70–99)
Glucose-Capillary: 79 mg/dL (ref 70–99)
Glucose-Capillary: 73 mg/dL (ref 70–99)

## 2021-08-12 LAB — SURGICAL PATHOLOGY

## 2021-08-12 MED ORDER — POTASSIUM CHLORIDE CRYS ER 20 MEQ PO TBCR
40.0000 meq | EXTENDED_RELEASE_TABLET | Freq: Once | ORAL | Status: AC
  Administered 2021-08-12: 40 meq via ORAL
  Filled 2021-08-12: qty 2

## 2021-08-12 MED ORDER — URSODIOL 300 MG PO CAPS
300.0000 mg | ORAL_CAPSULE | Freq: Three times a day (TID) | ORAL | Status: DC
  Administered 2021-08-12 – 2021-08-16 (×12): 300 mg via ORAL
  Filled 2021-08-12 (×14): qty 1

## 2021-08-12 NOTE — Progress Notes (Signed)
PROGRESS NOTE    Erik Chan  WUJ:[id number] DOB: 1955/11/02 DOA: 07/30/2021 PCP: [name], MD  Chief Complaint  Patient presents with   Weakness    Brief Narrative:  66 year old with history of COPD, nonischemic cardiomyopathy with reduced EF 20% comes to the ER with painless jaundice and loss of appetite.  Apparently patient felt very weak on 6/15 and laid on the floor and was unable to get back in her bed.  EMS was called to help with this.  Due to progressive weakness patient was brought to the hospital.  Upon admission patient was noted to be hyponatremic, elevated creatinine, transaminitis.  Ammonia level was normal.  Right upper quadrant ultrasound showed pleural effusion, ascites.  GI was consulted for acute hepatitis along with nephrology and CHF team due to concerns of low output cardiac failure.  Underwent RHC showing biventricular failure and started on milrinone and Lasix.      Assessment & Plan:   Principal Problem:   Acute hepatitis Active Problems:   Acute liver failure   Acute combined systolic and diastolic congestive heart failure (HCC)   Hyponatremia   AKI (acute kidney injury) (HCC)   Paroxysmal atrial fibrillation (HCC)   NSVT (nonsustained ventricular tachycardia) (HCC)   COPD (chronic obstructive pulmonary disease) (HCC)   Pleural effusion   Non-insulin dependent type 2 diabetes mellitus (HCC)   Protein-calorie malnutrition, severe   Status post PICC central line placement   Pressure ulcer   Chronic systolic CHF (congestive heart failure) (HCC)   Coagulopathy (HCC)   Chronic right heart failure (HCC)   Assessment and Plan: Acute liver failure Significantly elevated bilirubin, elevated ast/alt  Bili continued downtrend today, follow MRCP with cholelithiasis without intrahepatic or extrahepatic biliary dilation, no findings to suggest choledocholithiasis Negative acute hepatitis panel, negative ANA, negative ASMA, negative a1at Ceruloplasmin  mildly elevated Negative EBV DNA PCR Mitochondrial ab negative, hemochromatosis DNA PCR pending, HSV PCR negative GI c/s, appreciate recs -> 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 - follow results - pending    Acute combined systolic and diastolic congestive heart failure (HCC) Right heart cath 6/22 with severe biventricular failure with low output, no significant gradient between RA and HW pressures, suggesting cardiac cirrhosis Echo 05/30/2021 with EF 20-25% 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 vs genetic cardiomyopathy, +/- PVC's Appreciate cardiology assistance milrinone to stop today per cards, loop on hold per cards spironolactone Midodrine for BP  Hyponatremia Worsening gradually over past few days Fluid restriction Due to hypervolemia, diuresis per cardiology Renal signed off at this point, expected Na to improve with restoration of euvolemia  NSVT (nonsustained ventricular tachycardia) (HCC) Amiodarone and mexiletine on hold with liver failure  Paroxysmal atrial fibrillation [facility]) Per cardiology No amio given liver failure  AKI (acute kidney injury) (HCC) Per renal, suspected cardiorenal/hepatorenal  octreotide/midodrine Gradually improving Follow with diuresis  COPD (chronic obstructive pulmonary disease) (HCC) No si/sx exacerbation  Pleural effusion CT abd/pelvis with bilateral pleural effusions, small ascites, anasarca Suspected related to volume overload with HF  Non-insulin dependent type 2 diabetes mellitus (HCC) A1c 02/2021 6.7 BG's on lower side - hold SSI, follow BG's Home jardiance on hold  Protein-calorie malnutrition, severe noted  Status post PICC central line placement Left IJ 6/22 Left IJ removed 7/1 PICC placed 7/1  Pressure ulcer Pressure Injury 07/31/21 Sacrum Stage 2 -  Partial thickness loss of dermis presenting as Jahlia Omura shallow open injury with Kesia Dalto red, pink wound bed  without slough. (  Active)  07/31/21 1830  Location: Sacrum  Location Orientation:   Staging: Stage 2 -  Partial thickness loss of dermis presenting as Chrislynn Mosely shallow open injury with Meghin Thivierge red, pink wound bed without slough.  Wound Description (Comments):   Present on Admission: Yes          DVT prophylaxis: SCD Code Status: DNR Family Communication: none at bedside Disposition:   Status is: Inpatient Remains inpatient appropriate because: continued liver failure, worsening   Consultants:  IR Cardiology gastroenterology  Procedures:  Findings:   Hepatic wedge = 26 RA = 22 RV = 57/24  PA = 55/29 (38) PCW = 26 (v = 34) Fick cardiac output/index = 3.4/1.8 Therm CO/CI = 2.1/1.0 PVR = 3.2 WU FA sat = 97% PA sat = 59%, 59%  PAPi = 1.2   Assessment: 1. Severe biventricular failure with low output 2. 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 Remains extremely tenuous.   Antimicrobials:  Anti-infectives (From admission, onward)    None       Subjective: No new complaints today  Objective: Vitals:   08/11/21 2336 08/12/21 0451 08/12/21 0802 08/12/21 1128  BP: (!) 98/58 (!) 98/53 (!) 97/57 110/62  Pulse: 84 80 83 83  Resp: 20 17 19 15   Temp: 98.1 F (36.7 C) 98 F (36.7 C) 98.2 F (36.8 C) 98.6 F (37 C)  TempSrc: Oral Oral Oral Oral  SpO2: 92% 94% 97% 97%  Weight:  67.9 kg    Height:        Intake/Output Summary (Last 24 hours) at 08/12/2021 1254 Last data filed at 08/12/2021 0600 Gross per 24 hour  Intake 778.76 ml  Output 700 ml  Net 78.76 ml   Filed Weights   08/10/21 0628 08/11/21 0619 08/12/21 0451  Weight: 67.3 kg 71.5 kg 67.9 kg    Examination:  General: No acute distress. Scleral icterus, jaundiced generally Lungs: unlabored Neurological: Alert and oriented 3. Moves all extremities 4 with equal strength. Cranial nerves II through XII  grossly intact. Extremities: No clubbing or cyanosis. No edema.   Data Reviewed: I have personally reviewed following labs and imaging studies  CBC: Recent Labs  Lab 08/08/21 0613 08/09/21 0415 08/10/21 0444 08/11/21 0559 08/12/21 0355  WBC 9.1 9.5 8.3 7.9 8.1  NEUTROABS 7.5 7.8* 6.7 6.4 6.4  HGB 12.4* 11.6* 11.6* 11.9* 12.7*  HCT 33.5* 33.1* 33.0* 34.7* 34.7*  MCV 87.0 89.2 87.8 89.2 87.6  PLT 97* 100* 98* 101* 105*    Basic Metabolic Panel: Recent Labs  Lab 08/08/21 0613 08/09/21 0415 08/10/21 0444 08/11/21 0559 08/12/21 0355  NA 126* 127* 126* 126* 125*  K 3.6 3.4* 3.0* 3.1* 3.6  CL 81* 82* 81* 81* 82*  CO2 34* 35* 34* 34* 32  GLUCOSE 90 103* 74 104* 99  BUN 21 21 20 15 13   CREATININE 0.92 1.04 0.97 0.86 0.78  CALCIUM 8.0* 8.0* 7.9* 8.1* 8.0*  MG 2.2 2.2 2.1 2.3 2.2  PHOS 3.5 3.9 4.3 4.2 3.7    GFR: Estimated Creatinine Clearance: 87.2 mL/min (by C-G formula based on SCr of 0.78 mg/dL).  Liver Function Tests: Recent Labs  Lab 08/08/21 0613 08/09/21 0415 08/10/21 0444 08/11/21 0559 08/12/21 0355  AST 148* 156* 160* 171* 173*  ALT 169* 159* 158* 161* 168*  ALKPHOS 142* 168* 181* 192* 226*  BILITOT 29.5* 26.9* 24.0* 23.4* 23.3*  PROT 6.0*  5.4* 5.2* 5.4* 5.7*  ALBUMIN 2.4* 2.2* 2.1* 2.1* 2.1*    CBG: Recent Labs  Lab 08/11/21 1054 08/11/21 1606 08/11/21 2117 08/12/21 0612 08/12/21 1130  GLUCAP 95 79 77 79 69*     No results found for this or any previous visit (from the past 240 hour(s)).       Radiology Studies: No results found.      Scheduled Meds:  Chlorhexidine Gluconate Cloth  6 each Topical Daily   digoxin  0.125 mg Oral Daily   feeding supplement  237 mL Oral TID BM   Gerhardt's butt cream   Topical BID   lactulose  20 g Oral BID   midodrine  10 mg Oral TID WC   multivitamin with minerals  1 tablet Oral Daily   octreotide  100 mcg Subcutaneous TID   potassium chloride  40 mEq Oral Daily   potassium chloride  40 mEq  Oral Once   sodium chloride flush  3 mL Intravenous Q12H   spironolactone  12.5 mg Oral Daily   Continuous Infusions:     LOS: 13 days    Time spent: 31    Lacretia Nicks, MD [facility]   To contact the attending provider between 7A-7P or the covering provider during after hours 7P-7A, please log into the web site [email or web address] and access using universal  password for that web site. If you do not have the password, please call the hospital operator.  08/12/2021, 12:54 PM

## 2021-08-12 NOTE — Progress Notes (Signed)
[facility] Gastroenterology Progress Note  Erik Chan 66 y.o. [birth year]   Subjective: Patient seen and examined sitting in chair during physical therapy.  Patient denies GI complaints.  Still awaiting liver biopsy.  Patient continues to be fatigued.   ROS : Review of Systems  Gastrointestinal:  Negative for abdominal pain, blood in stool, constipation, diarrhea, heartburn, melena, nausea and vomiting.  Genitourinary:  Negative for dysuria and urgency.  Neurological:  Positive for weakness.    Objective: Vital signs in last 24 hours: Vitals:   08/12/21 0802 08/12/21 1128  BP: (!) 97/57 110/62  Pulse: 83 83  Resp: 19 15  Temp: 98.2 F (36.8 C) 98.6 F (37 C)  SpO2: 97% 97%    Physical Exam:  General:  Alert, cooperative, no distress, appears stated age, jaundiced, thin  Head:  Normocephalic, without obvious abnormality, atraumatic  Eyes:  Icteric sclera, EOM's intact  Lungs:   Clear to auscultation bilaterally, respirations unlabored  Heart:  Regular rate and rhythm, S1, S2 normal  Abdomen:   Soft, non-tender, bowel sounds active all four quadrants,  no masses,   Extremities: Extremities normal, atraumatic, no  edema  Pulses: 2+ and symmetric    Lab Results: Recent Labs    08/11/21 0559 08/12/21 0355  NA 126* 125*  K 3.1* 3.6  CL 81* 82*  CO2 34* 32  GLUCOSE 104* 99  BUN 15 13  CREATININE 0.86 0.78  CALCIUM 8.1* 8.0*  MG 2.3 2.2  PHOS 4.2 3.7   Recent Labs    08/11/21 0559 08/12/21 0355  AST 171* 173*  ALT 161* 168*  ALKPHOS 192* 226*  BILITOT 23.4* 23.3*  PROT 5.4* 5.7*  ALBUMIN 2.1* 2.1*   Recent Labs    08/11/21 0559 08/12/21 0355  WBC 7.9 8.1  NEUTROABS 6.4 6.4  HGB 11.9* 12.7*  HCT 34.7* 34.7*  MCV 89.2 87.6  PLT 101* 105*   Recent Labs    08/10/21 0444  LABPROT 17.1*  INR 1.4*      Assessment Elevated LFTs Severe biventricular heart failure with low ejection fraction of 20%  HGB 12.7(11.9) Platelets 105(101) AST 173(171)  ALT 168(161)  Alkphos 226(192) TBili 23.3(23.4) GFR >60  INR 08/10/2021 1.4  MELD 3.0: 27 at 08/12/2021  3:55 AM MELD-Na: 29 at 08/12/2021  3:55 AM Calculated from: Serum Creatinine: 0.78 mg/dL (Using min of 1 mg/dL) at 0/07/2374  2:83 AM Serum Sodium: 125 mmol/L at 08/12/2021  3:55 AM Total Bilirubin: 23.3 mg/dL at 02/15/1759  6:07 AM Serum Albumin: 2.1 g/dL at 04/16/1060  6:94 AM INR(ratio): 1.4 at 08/10/2021  4:44 AM Age at listing (hypothetical): 62 years Sex: Male at 08/12/2021  3:55 AM    Negative hepatitis panel.  Normal ANA and ASMA.  Mildly elevated ceruloplasmin.  Normal iron saturation. Elevated ferritin.  Normal alpha-1 antitrypsin level.  Negative EBV PCR.  Negative hereditary hemochromatosis gene evaluation.  Palliative medicine consulted given overall poor prognosis with plans for ongoing incremental palliative care support.  Patient is DNI/DNAR.   Plan: Awaiting liver biopsy for further recommendations.  Continue supportive care.  [facility] will follow.   Roseanne Reno Daijha Leggio PA-C 08/12/2021, 12:21 PM  Contact #  [phone]

## 2021-08-12 NOTE — Plan of Care (Signed)

## 2021-08-12 NOTE — Progress Notes (Signed)
Palliative Medicine Inpatient Follow Up Note  HPI: 66 y.o. male  with past medical history of biventricular failure, tobacco abuse, COPD, diabetes type 2, and nonischemic cardiomyopathy (EF 20%) admitted on 07/30/2021 with fatigue, loss of appetite, and painless jaundice.  Patient is being treated for hyponatremia, AKI, hyperbilirubin and EF, elevated LFTs (MELD score 32).  MRCP revealed cholelithiasis without biliary dilation revealed.  Right heart cath to be performed today.  Palliative medicine team was consulted to discuss goals of care given patient's overall poor prognosis.  Today's Discussion 08/12/2021  *Please note that this is a verbal dictation therefore any spelling or grammatical errors are due to the "Dragon Medical One" system interpretation.  Chart reviewed inclusive of vital signs, progress notes, laboratory results, and diagnostic images.   I met with [place] this morning.  We discussed a multitude of topics of interest inclusive of [place] interest in music.  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  He expresses that he was very known in the community for his Ezzie Dural impersonation.  We discussed again [place] position and roles throughout his career.  He is very proud of his intelligence and the things he has accomplished.  Discussed again the role of palliative care and [place] clinical picture and how we are here to try to help guide him based upon the information received in the setting of his acute on chronic disease processes.  He is very well aware that a lot of the decisions moving forward are contingent upon patient's liver biopsy.  [place] did make some comments to me about death and dying in his wishes in terms of funeral home arrangements.  He still endorses a willingness to fight  and a desire to live despite his given circumstances.  Palliative support provided.   Objective Assessment: Vital Signs Vitals:   08/12/21 0451 08/12/21 0802  BP: (!) 98/53 (!) 97/57  Pulse: 80 83  Resp: 17 19  Temp: 98 F (36.7 C) 98.2 F (36.8 C)  SpO2: 94% 97%    Intake/Output Summary (Last 24 hours) at 08/12/2021 2049 Last data filed at 08/12/2021 0600 Gross per 24 hour  Intake 878.76 ml  Output 800 ml  Net 78.76 ml    Last Weight  Most recent update: 08/12/2021  4:55 AM    Weight  67.9 kg (149 lb 11.1 oz)            Gen: Very frail Caucasian male in no acute distress HEENT: moist mucous membranes CV: Regular rate and rhythm PULM: On room air breathing is even and nonlabored ABD: soft/nontender EXT: Bilateral lower extremity edema Neuro: Alert and oriented x3  SUMMARY OF RECOMMENDATIONS   DNAR/DNI  Liver biopsy result remains to be pending  Sayeed appears to be progressing to the acceptance stage of grieving  We will request spiritual support given [place] strong Christian values  Ongoing incremental palliative care support will be provided  Billing based on MDM: High ______ Lamarr Lulas [place] Palliative Medicine Team Team Cell Phone: [phone] Please utilize secure chat with additional questions, if there is no response within 30 minutes please call the above phone number  Palliative Medicine Team providers are available by phone from 7am to 7pm daily and can be reached through the team cell phone.  Should this  patient require assistance outside of these hours, please call the patient's attending physician.

## 2021-08-12 NOTE — TOC CM/SW Note (Signed)
HF TOC CM spoke to pt and mother states he still wants to go to [facility]. Waiting recommendations and will need insurance auth for SNF. Isidoro Donning RN3 CCM, Heart Failure TOC CM [phone]

## 2021-08-12 NOTE — Progress Notes (Addendum)
Advanced Heart Failure Rounding Note  PCP-Cardiologist: Nicki Guadalajara, MD   Subjective:    06/26: Milrinone cut back to 0.125 mcg. 06/29: Lasix gtt stopped >>> PO Torsemide. Liver biopsy.  ? CO-OX 88% on 0.125 milrinone  Off diuretics. CVP 5  Scr 0.78 K 3.6 Na 125  Total bili continues to trend down, 23 this am  Liver biopsy results pending.  Remains in AF with controlled rate.  No complaints this am. Denies dyspnea, orthopnea or PND. Has been declining mobility d/t fatigue. Poor appetite.   RHC  6/22  Hepatic wedge = 26 RA = 22 RV = 57/24  PA = 55/29 (38) PCW = 26 (v = 34) Fick cardiac output/index = 3.4/1.8 Therm CO/CI = 2.1/1.0 PVR = 3.2 WU FA sat = 97% PA sat = 59%, 59%  PAPi = 1.2  Assessment: 1. Severe biventricular failure with low output 2. 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    Objective:   Weight Range: 67.9 kg Body mass index is 19.22 kg/m.   Vital Signs:   Temp:  [97.7 F (36.5 C)-98.1 F (36.7 C)] 98 F (36.7 C) (07/03 0451) Pulse Rate:  [65-84] 80 (07/03 0451) Resp:  [11-20] 17 (07/03 0451) BP: (97-106)/(50-67) 98/53 (07/03 0451) SpO2:  [91 %-95 %] 94 % (07/03 0451) Weight:  [67.9 kg] 67.9 kg (07/03 0451) Last BM Date : 08/09/21  Weight change: Filed Weights   08/10/21 0628 08/11/21 0619 08/12/21 0451  Weight: 67.3 kg 71.5 kg 67.9 kg    Intake/Output:   Intake/Output Summary (Last 24 hours) at 08/12/2021 0705 Last data filed at 08/12/2021 0600 Gross per 24 hour  Intake 978.76 ml  Output 1000 ml  Net -21.24 ml      Physical Exam   General:  Cachectic, chronically ill appearing. Sitting up in bed. HEENT: normal Neck: supple. no JVD. Carotids 2+ bilat; no bruits.  Cor: PMI nondisplaced. Irregular rate & rhythm. No rubs, gallops or murmurs. Lungs: clear Abdomen: soft, nontender, nondistended.  Extremities: no cyanosis, clubbing, rash, edema, + RUE PICC Neuro: alert  & orientedx3, cranial nerves grossly intact. moves all 4 extremities w/o difficulty. Affect pleasant    Telemetry   AF 70s (personally reviewed)   Labs    CBC Recent Labs    08/11/21 0559 08/12/21 0355  WBC 7.9 8.1  NEUTROABS 6.4 6.4  HGB 11.9* 12.7*  HCT 34.7* 34.7*  MCV 89.2 87.6  PLT 101* 105*   Basic Metabolic Panel Recent Labs    31/54/00 0559 08/12/21 0355  NA 126* 125*  K 3.1* 3.6  CL 81* 82*  CO2 34* 32  GLUCOSE 104* 99  BUN 15 13  CREATININE 0.86 0.78  CALCIUM 8.1* 8.0*  MG 2.3 2.2  PHOS 4.2 3.7   Liver Function Tests Recent Labs    08/11/21 0559 08/12/21 0355  AST 171* 173*  ALT 161* 168*  ALKPHOS 192* 226*  BILITOT 23.4* 23.3*  PROT 5.4* 5.7*  ALBUMIN 2.1* 2.1*   No results for input(s): "LIPASE", "AMYLASE" in the last 72 hours.  Cardiac Enzymes No results for input(s): "CKTOTAL", "CKMB", "CKMBINDEX", "TROPONINI" in the last 72 hours.  BNP: BNP (last 3 results) Recent Labs    02/01/21 1522 02/13/21 1709  BNP 2,411.9* 1,831.1*    ProBNP (last 3 results) No results for input(s): "PROBNP" in the last 8760 hours.   D-Dimer No results for input(s): "DDIMER" in the last 72 hours. Hemoglobin A1C  No results for input(s): "HGBA1C" in the last 72 hours. Fasting Lipid Panel No results for input(s): "CHOL", "HDL", "LDLCALC", "TRIG", "CHOLHDL", "LDLDIRECT" in the last 72 hours. Thyroid Function Tests No results for input(s): "TSH", "T4TOTAL", "T3FREE", "THYROIDAB" in the last 72 hours.  Invalid input(s): "FREET3"  Other results:   Imaging    No results found.   Medications:     Scheduled Medications:  Chlorhexidine Gluconate Cloth  6 each Topical Daily   digoxin  0.125 mg Oral Daily   feeding supplement  237 mL Oral TID BM   Gerhardt's butt cream   Topical BID   lactulose  20 g Oral BID   midodrine  10 mg Oral TID WC   multivitamin with minerals  1 tablet Oral Daily   octreotide  100 mcg Subcutaneous TID   potassium  chloride  40 mEq Oral Daily   sodium chloride flush  3 mL Intravenous Q12H   spironolactone  12.5 mg Oral Daily    Infusions:  milrinone 0.125 mcg/kg/min (08/11/21 1553)    PRN Medications: acetaminophen, albuterol, guaiFENesin, hydrALAZINE, iohexol, ipratropium-albuterol, morphine injection, ondansetron (ZOFRAN) IV, mouth rinse, senna-docusate, traZODone    Patient Profile    66 y.o. male with history of chronic biventricular HF/NICM, PVCs/NSVT, COPD, hx pleural effusion s/p right thoracentesis 01/23, DM II. Admitted with progressive weakness in setting of elevated LFTs, hyponatremia, AKI and concern for a/c biventricular HF with low-output   Assessment/Plan   1. Chronic Biventricular HFrEF/ NICM - Echo (1/23): severely reduced EF < 20% and severely reduced RV. Moderate to severe MR.  - L/RHC (1/23): moderate non-obstructive CAD. EF < 10% - cMRI (1/23): LVEF 10%, severe biventricular heart failure, basal septal wall mid-wall LGE, apical inferior subendocardial LGE, and RV insertion non specific scar pattern.  - CT chest (1/23): without evidence for pulmonary sarcoidosis.   - Myeloma panel and SPEP/UPEP negative. ACE level OK (37). - Etiology for CM => isolated cardiac sarcoidosis vs prior myocarditis vs genetic cardiomyopathy, +/- PVCs - Cardiac PET previously set up at [facility], has not completed. Genetic testing sent 03/04/21. - Echo 05/30/21 EF 20-25% RV severely reduced.  - Referred to EP for ICD in April - has not scheduled appointment.  - RHC 08/01/21 with low output and elevated filling pressures. - CO-OX high on milrinone 0.125 mcg. Will recheck. ? Discontinuing milrinone if repeat CO-OX remains elevated. - CVP 5. Not currently on diuretic. Poor po intake. - SBP soft. Continue midodrine 10 mg TID. Can increase as needed - Continue digoxin 0.125 - Continue spiro 12.5 mg daily - HF optimized but continues with liver failure. Bilirubin slowly starting to improve.  2. Atrial  fibrillation - New, noted on tele this admit.  - Rate 90s-100s - Limited rhythm control options. No amiodarone d/t liver failure - Not a candidate for anticoagulation with liver failure - No change  3. Acute liver failure - US abdomen R pleural effusion, + ascites, increased echogenicity of liver, large gallstone with gallbladder sludge. - CT abdomen pelvis - bilateral pleural effusions, small ascites, anasarca, + gallstone - MRCP- no focal liver abnormality, cholelithiasis without biliary dilatation, no choledocholithiasis, moderate to large b/l pleural effusions, mesenteric edema with ascites, diffuse body wall edema - Hepatitis panel negative. ANA negative. Hemochromatosis genetics still pending. - Suspect etiology most likely CHF - [facility] following. S/p liver biopsy 06/29. Results pending. - Total Bili 24.6>24.9>25.1>26.2>26.1>29>30>27>24>23   4. Hyponatremia - Hypervolemic hyponatremia - Na 120>121>122 >123 >121 >122>126>127>126>125, now improving after diuresis -  Not a good candidate for Tolvaptan with liver dysfunction - Supporting RV with milrinone   5. AKI: - Scr baseline ~ 1, up to 1.6 this admit.  - Now back to baseline.  - Follow renal function with diuretics - Midodrine to support BP   6. PVCs/NSVT - >20% PVC burden during admit 01/23 - Rare PVCs on tele - Will not restart amiodarone and mexiletine with liver failure - No OSA on sleep study 2/23.   7. COPD - Former heavy smoker. Quit smoking 01/07/21.   8. H/o Pleural Effusion - s/p rt thoracentesis 1/23 w/ 1200 cc fluid removal. - moderate to large b/l pleural effusions on MR this admit  9. 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 No option for heart-liver transplant.  - Palliative Care now on board. Appreciate assistance. - Now DNR   Length of Stay: [place], [place], PA-C  08/12/2021, 7:05 AM  Advanced Heart Failure Team Pager [phone] (M-F; 7a - 5p)  Please contact [facility] for night-coverage after hours (5p -7a ) and weekends on [email or web address]  Patient seen with PA, agree with the above note.   Co-ox 88% today, has been reading in 80s.  Remains on milrinone 0.125.  CVP 5.  Na chronically low at 125. LFTs remain stably elevated.    He remains in rate-controlled atrial fibrillation.   Reports fatigue, no dyspnea.  Speaking with palliative care NP this morning .  General: NAD, thin.  Neck: No JVD, no thyromegaly or thyroid nodule.  Lungs: Clear to auscultation bilaterally with normal respiratory effort. CV: Nondisplaced PMI.  Heart irregular S1/S2, no S3/S4, no murmur.  No peripheral edema.   Abdomen: Soft, nontender, no hepatosplenomegaly, no distention.  Skin: Intact without lesions or rashes.  Neurologic: Alert and oriented x 3.  Psych: Normal affect. Extremities: No clubbing or cyanosis.  HEENT: Normal.   Cardiac output appears stable, and MAP stable on midodrine. CVP 5.  - Stop milrinone today.  - Does not need diuretic.   Chronic, rate-controlled atrial fibrillation.  Currently not anticoagulated due to liver failure.   Maintain po fluid restriction with hyponatremia, likely related to liver disease.   Etiology of liver failure uncertain though RV failure thought to play a role.  Pending liver biopsy results still, GI following.   Loralie Champagne 08/12/2021 8:01 AM

## 2021-08-12 NOTE — Consult Note (Addendum)
WOC Nurse Consult Note: Reason for Consult: Consult requested for sacrum.  This was performed remotely after review of the EMR. Pt is noted to have a Stage 2 pressure injury, which was present on admission, according to the nursing wound care flow sheet. These types of wounds can be treated independently by the bedside nurse using the Standing Skin care order set. Gerhardt's cream has already been ordered to protect skin and repel moisture to the affected area.  Pressure Injury POA: Yes Please re-consult if further assistance is needed.  Thank-you,  Cammie Mcgee MSN, RN, CWOCN, [place], CNS [phone]

## 2021-08-12 NOTE — Progress Notes (Addendum)
Physical Therapy Treatment Patient Details Name: Erik Chan MRN: [id number] DOB: 12-11-1955 Today's Date: 08/12/2021   History of Present Illness Pt is a 66 y.o. male admitted 07/30/21 with jaundice, generalized weakness (reports not being able to get up since 07/25/21). 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 S/p RHC 6/22 with biventricular failure, concern for low output. PMH includes HF (EF 20%), NICM, COPD, DM2, fibromyalgia.    PT Comments    Pt agreeable to mobility this session with plan for OOB for bed change. Pt did perform repeated standing trials and HEP but remains self-limiting denying any gait trials. Pt states he will agree to walk to door next session. Pt educated for strengthening and mobility to improve function. Will continue to follow.  Pt at 99% on 2L on arrival with desaturation to 88% on RA with activity and placed on 1L at 92%.   Recommendations for follow up therapy are one component of a multi-disciplinary discharge planning process, led by the attending physician.  Recommendations may be updated based on patient status, additional functional criteria and insurance authorization.  Follow Up Recommendations  Skilled nursing-short term rehab (<3 hours/day) Can patient physically be transported by private vehicle: Yes   Assistance Recommended at Discharge Intermittent Supervision/Assistance  Patient can return home with the following A lot of help with walking and/or transfers;A lot of help with bathing/dressing/bathroom;Assistance with cooking/housework;Help with stairs or ramp for entrance;Assist for transportation   Equipment Recommendations  Hospital bed;Wheelchair cushion (measurements PT);Wheelchair (measurements PT)    Recommendations for Other Services       Precautions / Restrictions Precautions Precautions: Fall Precaution Comments: urine incontinence, watch sats      Mobility  Bed Mobility Overal bed mobility: Needs Assistance Bed Mobility: Supine to Sit     Supine to sit: Min assist, HOB elevated     General bed mobility comments: HOB 35 degrees with pt independently moving legs off EOB, min assist to elevate trunk with cues for sequence    Transfers Overall transfer level: Needs assistance   Transfers: Sit to/from Stand, Bed to chair/wheelchair/BSC Sit to Stand: Min assist, Mod assist   Step pivot transfers: Min guard       General transfer comment: min assist to rise from elevated bed, mod assist to rise from recliner. use of RW and minguard to step from bed to recliner 2' away. pt performed 2nd stand from chair and remained standing 35 sec    Ambulation/Gait               General Gait Details: refused to attempt   Stairs             Wheelchair Mobility    Modified Rankin (Stroke Patients Only)       Balance Overall balance assessment: Needs assistance   Sitting balance-Leahy Scale: Fair Sitting balance - Comments: static sitting unsupported   Standing balance support: Bilateral upper extremity supported, Reliant on assistive device for balance Standing balance-Leahy Scale: Poor Standing balance comment: Reliant on RW                            Cognition Arousal/Alertness: Awake/alert Behavior During Therapy: Flat affect Overall Cognitive Status: Impaired/Different from baseline Area of Impairment: Attention, Memory, Following commands, Safety/judgement, Awareness, Problem solving                   Current Attention Level: Sustained Memory: Decreased short-term  memory Following Commands: Follows one step commands with increased time Safety/Judgement: Decreased awareness of safety, Decreased awareness of deficits   Problem Solving: Decreased initiation, Requires verbal cues General Comments: self-limiting but more engaged than prior sessions        Exercises General Exercises  - Lower Extremity Short Arc Quad: AAROM, Both, Seated, 10 reps Hip ABduction/ADduction: AAROM, Both, 10 reps, Seated Hip Flexion/Marching: AAROM, Both, 10 reps, Seated    General Comments        Pertinent Vitals/Pain Pain Assessment Pain Assessment: 0-10 Pain Score: 3  Pain Location: generalized Pain Descriptors / Indicators: Aching Pain Intervention(s): Limited activity within patient's tolerance, Monitored during session, Repositioned    Home Living                          Prior Function            PT Goals (current goals can now be found in the care plan section) Acute Rehab PT Goals Time For Goal Achievement: 08/26/21 Potential to Achieve Goals: Fair Progress towards PT goals: Goals downgraded-see care plan    Frequency    Min 3X/week      PT Plan Current plan remains appropriate    Co-evaluation              AM-PAC PT "6 Clicks" Mobility   Outcome Measure  Help needed turning from your back to your side while in a flat bed without using bedrails?: A Little Help needed moving from lying on your back to sitting on the side of a flat bed without using bedrails?: A Little Help needed moving to and from a bed to a chair (including a wheelchair)?: A Lot Help needed standing up from a chair using your arms (e.g., wheelchair or bedside chair)?: A Lot Help needed to walk in hospital room?: Total Help needed climbing 3-5 steps with a railing? : Total 6 Click Score: 12    End of Session Equipment Utilized During Treatment: Gait belt Activity Tolerance: Patient limited by fatigue Patient left: in chair;with call bell/phone within reach Nurse Communication: Mobility status PT Visit Diagnosis: Unsteadiness on feet (R26.81);Muscle weakness (generalized) (M62.81);Difficulty in walking, not elsewhere classified (R26.2)     Time: 3582-5189 PT Time Calculation (min) (ACUTE ONLY): 27 min  Charges:  $Therapeutic Exercise: 8-22 mins $Therapeutic  Activity: 8-22 mins                     Merryl Hacker, PT Acute Rehabilitation Services Office: [phone]    Enedina Finner Wynee Matarazzo 08/12/2021, 2:05 PM

## 2021-08-13 DIAGNOSIS — I5041 Acute combined systolic (congestive) and diastolic (congestive) heart failure: Secondary | ICD-10-CM | POA: Diagnosis not present

## 2021-08-13 DIAGNOSIS — B179 Acute viral hepatitis, unspecified: Secondary | ICD-10-CM | POA: Diagnosis not present

## 2021-08-13 LAB — CBC WITH DIFFERENTIAL/PLATELET
Abs Immature Granulocytes: 0.07 10*3/uL (ref 0.00–0.07)
Basophils Absolute: 0 10*3/uL (ref 0.0–0.1)
Basophils Relative: 1 %
Eosinophils Absolute: 0.1 10*3/uL (ref 0.0–0.5)
Eosinophils Relative: 1 %
HCT: 36.3 % — ABNORMAL LOW (ref 39.0–52.0)
Hemoglobin: 12.8 g/dL — ABNORMAL LOW (ref 13.0–17.0)
Immature Granulocytes: 1 %
Lymphocytes Relative: 13 %
Lymphs Abs: 0.9 10*3/uL (ref 0.7–4.0)
MCH: 31.4 pg (ref 26.0–34.0)
MCHC: 35.3 g/dL (ref 30.0–36.0)
MCV: 89.2 fL (ref 80.0–100.0)
Monocytes Absolute: 0.5 10*3/uL (ref 0.1–1.0)
Monocytes Relative: 7 %
Neutro Abs: 5.8 10*3/uL (ref 1.7–7.7)
Neutrophils Relative %: 77 %
Platelets: 102 10*3/uL — ABNORMAL LOW (ref 150–400)
RBC: 4.07 MIL/uL — ABNORMAL LOW (ref 4.22–5.81)
RDW: 21.5 % — ABNORMAL HIGH (ref 11.5–15.5)
Smear Review: NORMAL
WBC: 7.4 10*3/uL (ref 4.0–10.5)
nRBC: 0 % (ref 0.0–0.2)

## 2021-08-13 LAB — COMPREHENSIVE METABOLIC PANEL
ALT: 170 U/L — ABNORMAL HIGH (ref 0–44)
AST: 173 U/L — ABNORMAL HIGH (ref 15–41)
Albumin: 2.2 g/dL — ABNORMAL LOW (ref 3.5–5.0)
Alkaline Phosphatase: 224 U/L — ABNORMAL HIGH (ref 38–126)
Anion gap: 13 (ref 5–15)
BUN: 13 mg/dL (ref 8–23)
CO2: 31 mmol/L (ref 22–32)
Calcium: 8.2 mg/dL — ABNORMAL LOW (ref 8.9–10.3)
Chloride: 82 mmol/L — ABNORMAL LOW (ref 98–111)
Creatinine, Ser: 0.79 mg/dL (ref 0.61–1.24)
GFR, Estimated: >60 mL/min (ref >60)
Glucose, Bld: 98 mg/dL (ref 70–99)
Potassium: 3.4 mmol/L — ABNORMAL LOW (ref 3.5–5.1)
Sodium: 126 mmol/L — ABNORMAL LOW (ref 135–145)
Total Bilirubin: 22.4 mg/dL (ref 0.3–1.2)
Total Protein: 5.8 g/dL — ABNORMAL LOW (ref 6.5–8.1)

## 2021-08-13 LAB — GLUCOSE, CAPILLARY
Glucose-Capillary: 88 mg/dL (ref 70–99)
Glucose-Capillary: 90 mg/dL (ref 70–99)
Glucose-Capillary: 132 mg/dL — ABNORMAL HIGH (ref 70–99)
Glucose-Capillary: 125 mg/dL — ABNORMAL HIGH (ref 70–99)

## 2021-08-13 LAB — COOXEMETRY PANEL
Carboxyhemoglobin: 2.8 % — ABNORMAL HIGH (ref 0.5–1.5)
Methemoglobin: 0.8 % (ref 0.0–1.5)
O2 Saturation: 71.8 %
Total hemoglobin: 13.1 g/dL (ref 12.0–16.0)

## 2021-08-13 LAB — MAGNESIUM: Magnesium: 2.2 mg/dL (ref 1.7–2.4)

## 2021-08-13 LAB — PHOSPHORUS: Phosphorus: 3.6 mg/dL (ref 2.5–4.6)

## 2021-08-13 MED ORDER — POTASSIUM CHLORIDE CRYS ER 20 MEQ PO TBCR
40.0000 meq | EXTENDED_RELEASE_TABLET | Freq: Once | ORAL | Status: AC
Start: 2021-08-13 — End: 2021-08-13
  Administered 2021-08-13: 40 meq via ORAL
  Filled 2021-08-13: qty 2

## 2021-08-13 MED ORDER — FUROSEMIDE 40 MG PO TABS: 40.0000 mg | ORAL_TABLET | Freq: Every day | ORAL | Status: DC

## 2021-08-13 MED ORDER — POTASSIUM CHLORIDE CRYS ER 20 MEQ PO TBCR
40.0000 meq | EXTENDED_RELEASE_TABLET | ORAL | Status: DC
Start: 2021-08-13 — End: 2021-08-13

## 2021-08-13 MED ORDER — TORSEMIDE 20 MG PO TABS
20.0000 mg | ORAL_TABLET | Freq: Every day | ORAL | Status: DC
  Administered 2021-08-13: 20 mg via ORAL
  Filled 2021-08-13: qty 1

## 2021-08-13 NOTE — Progress Notes (Signed)
Patient ID: Erik Chan, male   DOB: 21-Jul-1955, 66 y.o.   MRN: [id number]     Advanced Heart Failure Rounding Note  PCP-Cardiologist: Shelva Majestic, MD   Subjective:    06/26: Milrinone cut back to 0.125 mcg. 06/29: Lasix gtt stopped >>> PO Torsemide. Liver biopsy. 07/03: Milrinone stopped  Co-ox 72%, now off milrinone.    Off diuretics. CVP 9-10.   LFTs remain elevated with tbili 22.  Liver biopsy with moderate-severe cholestasis, minimal hepatitis (cholestatic hepatitis).   Remains in AF with controlled rate.  No complaints this am. Denies dyspnea, orthopnea or PND. Weak.   RHC  6/22  Hepatic wedge = 26 RA = 22 RV = 57/24  PA = 55/29 (38) PCW = 26 (v = 34) Fick cardiac output/index = 3.4/1.8 Therm CO/CI = 2.1/1.0 PVR = 3.2 WU FA sat = 97% PA sat = 59%, 59%  PAPi = 1.2  Assessment: 1. Severe biventricular failure with low output 2. 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    Objective:   Weight Range: 102.5 kg Body mass index is 29.02 kg/m.   Vital Signs:   Temp:  [97.7 F (36.5 C)-98.6 F (37 C)] 97.8 F (36.6 C) (07/04 0318) Pulse Rate:  [75-83] 83 (07/04 0318) Resp:  [14-17] 14 (07/04 0318) BP: (95-110)/(51-62) 95/58 (07/04 0318) SpO2:  [93 %-97 %] 94 % (07/04 0318) Weight:  [102.5 kg] 102.5 kg (07/04 0318) Last BM Date : 08/09/21  Weight change: Filed Weights   08/11/21 0619 08/12/21 0451 08/13/21 0318  Weight: 71.5 kg 67.9 kg 102.5 kg    Intake/Output:   Intake/Output Summary (Last 24 hours) at 08/13/2021 0824 Last data filed at 08/13/2021 0321 Gross per 24 hour  Intake 240 ml  Output 400 ml  Net -160 ml      Physical Exam   General: NAD, thin.  Neck: JVP 8-9 cm, no thyromegaly or thyroid nodule.  Lungs: Clear to auscultation bilaterally with normal respiratory effort. CV: Nondisplaced PMI.  Heart irregular S1/S2, no S3/S4, no murmur.  No peripheral edema.   Abdomen: Soft, nontender,  no hepatosplenomegaly, no distention.  Skin: Intact without lesions or rashes.  Neurologic: Alert and oriented x 3.  Psych: Normal affect. Extremities: No clubbing or cyanosis.  HEENT: Normal.   Telemetry   AF 70s (personally reviewed)   Labs    CBC Recent Labs    08/12/21 0355 08/13/21 0335  WBC 8.1 7.4  NEUTROABS 6.4 5.8  HGB 12.7* 12.8*  HCT 34.7* 36.3*  MCV 87.6 89.2  PLT 105* [id number]*   Basic Metabolic Panel Recent Labs    08/12/21 0355 08/13/21 0335  NA 125* 126*  K 3.6 3.4*  CL 82* 82*  CO2 32 31  GLUCOSE 99 98  BUN 13 13  CREATININE 0.78 0.79  CALCIUM 8.0* 8.2*  MG 2.2 2.2  PHOS 3.7 3.6   Liver Function Tests Recent Labs    08/12/21 0355 08/13/21 0335  AST 173* 173*  ALT 168* 170*  ALKPHOS 226* 224*  BILITOT 23.3* 22.4*  PROT 5.7* 5.8*  ALBUMIN 2.1* 2.2*   No results for input(s): "LIPASE", "AMYLASE" in the last 72 hours.  Cardiac Enzymes No results for input(s): "CKTOTAL", "CKMB", "CKMBINDEX", "TROPONINI" in the last 72 hours.  BNP: BNP (last 3 results) Recent Labs    02/01/21 1522 02/13/21 1709  BNP 2,411.9* 1,831.1*    ProBNP (last 3 results) No results for input(s): "PROBNP" in the last  8760 hours.   D-Dimer No results for input(s): "DDIMER" in the last 72 hours. Hemoglobin A1C No results for input(s): "HGBA1C" in the last 72 hours. Fasting Lipid Panel No results for input(s): "CHOL", "HDL", "LDLCALC", "TRIG", "CHOLHDL", "LDLDIRECT" in the last 72 hours. Thyroid Function Tests No results for input(s): "TSH", "T4TOTAL", "T3FREE", "THYROIDAB" in the last 72 hours.  Invalid input(s): "FREET3"  Other results:   Imaging    No results found.   Medications:     Scheduled Medications:  Chlorhexidine Gluconate Cloth  6 each Topical Daily   digoxin  0.125 mg Oral Daily   feeding supplement  237 mL Oral TID BM   furosemide  40 mg Oral Daily   Gerhardt's butt cream   Topical BID   lactulose  20 g Oral BID   midodrine   10 mg Oral TID WC   multivitamin with minerals  1 tablet Oral Daily   octreotide  100 mcg Subcutaneous TID   potassium chloride  40 mEq Oral Daily   potassium chloride  40 mEq Oral Once   sodium chloride flush  3 mL Intravenous Q12H   spironolactone  12.5 mg Oral Daily   ursodiol  300 mg Oral TID    Infusions:    PRN Medications: acetaminophen, albuterol, guaiFENesin, hydrALAZINE, iohexol, ipratropium-albuterol, morphine injection, ondansetron (ZOFRAN) IV, mouth rinse, senna-docusate, traZODone    Patient Profile    66 y.o. male with history of chronic biventricular HF/NICM, PVCs/NSVT, COPD, hx pleural effusion s/p right thoracentesis 01/23, DM II. Admitted with progressive weakness in setting of elevated LFTs, hyponatremia, AKI and concern for a/c biventricular HF with low-output   Assessment/Plan   1. Chronic Biventricular HFrEF/ NICM - Echo (1/23): severely reduced EF < 20% and severely reduced RV. Moderate to severe MR.  - L/RHC (1/23): moderate non-obstructive CAD. EF < 10% - cMRI (1/23): LVEF 10%, severe biventricular heart failure, basal septal wall mid-wall LGE, apical inferior subendocardial LGE, and RV insertion non specific scar pattern.  - CT chest (1/23): without evidence for pulmonary sarcoidosis.   - Myeloma panel and SPEP/UPEP negative. ACE level OK (37). - Etiology for CM => isolated cardiac sarcoidosis vs prior myocarditis vs genetic cardiomyopathy, +/- PVCs - Cardiac PET previously set up at [facility], has not completed. Genetic testing sent 03/04/21. - Echo 05/30/21 EF 20-25% RV severely reduced.  - Referred to EP for ICD in April - has not scheduled appointment.  - RHC 08/01/21 with low output and elevated filling pressures. - Co-ox 72%, now off milrinone. - CVP 9-10. Will start back on torsemide 20 mg daily.  - SBP 90s-100s. Continue midodrine 10 mg TID. Can increase as needed - Continue digoxin 0.125 - Continue spiro 12.5 mg daily - HF optimized but continues  with liver failure.   2. Atrial fibrillation - New, noted on tele this admit.  - Rate 70s - Limited rhythm control options. No amiodarone due to liver failure - Not a candidate for anticoagulation with liver failure - No change  3. Acute liver failure - US abdomen R pleural effusion, + ascites, increased echogenicity of liver, large gallstone with gallbladder sludge. - CT abdomen pelvis - bilateral pleural effusions, small ascites, anasarca, + gallstone - MRCP- no focal liver abnormality, cholelithiasis without biliary dilatation, no choledocholithiasis, moderate to large b/l pleural effusions, mesenteric edema with ascites, diffuse body wall edema - Hepatitis panel negative. ANA negative. Hemochromatosis genetics still pending. - [facility] following. 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  ?Drug related, ?autoimmune.  - Total Bili 24.6>24.9>25.1>26.2>26.1>29>30>27>24>23>22   4. Hyponatremia - Hypervolemic hyponatremia - Na 120>121>122 >123 >121 >122>126>127>126>125>126.  - Not a good candidate for Tolvaptan with liver dysfunction - Keep po fluid restricted.   5. AKI: - Scr baseline ~ 1, up to 1.6 this admit.  - Now back to baseline.  - Follow renal function with diuretics - Midodrine to support BP   6. PVCs/NSVT - >20% PVC burden during admit 01/23 - Rare PVCs on tele - Will not restart amiodarone and mexiletine with liver failure - No OSA on sleep study 2/23.   7. COPD - Former heavy smoker. Quit smoking 01/07/21.   8. H/o Pleural Effusion - s/p rt thoracentesis 1/23 w/ 1200 cc fluid removal. - moderate to large b/l pleural effusions on MR this admit  9. Hypokalemia - K 3.4 - Supp   10. 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 No option for heart-liver transplant.  - Palliative Care now on board. Appreciate assistance. - Now DNR  Mobilize, will need rehab facility at discharge.    Length of Stay:  46  Marca Ancona, MD  08/13/2021, 8:24 AM  Advanced Heart Failure Team Pager [phone] (M-F; 7a - 5p)  Please contact [facility] for night-coverage after hours (5p -7a ) and weekends on [email or web address]

## 2021-08-13 NOTE — Progress Notes (Signed)
Liver biopsy shows moderate to severe cholestasis with minimal hepatitis-cholestatic, no fibrosis. Possibly DILI/drug-induced liver injury, features of congestive hepatopathy not present.  TB/AST/ALT/ALP  22.4/173/170/224 on 08/13/21 23.2/173/168/226 on 08/12/21  No encephalopathy No coagulopathy No signs of liver failure  Work-up negative for: Hereditary hemochromatosis, deficiency of ceruloplasmin or alpha-1 antitrypsin, primary biliary cholangitis, chronic hepatitis B or hepatitis C.  Recommend avoiding hepatotoxic medications. Ursodiol 300 mg 3 times a day was started yesterday. We will follow-up LFTs periodically.

## 2021-08-13 NOTE — Progress Notes (Signed)
Mobility Specialist Progress Note    08/13/21 1623  Mobility  Activity Ambulated with assistance in room  Level of Assistance Moderate assist, patient does 50-74%  Assistive Device Front wheel walker  Distance Ambulated (ft) 8 ft  Activity Response Tolerated well  $Mobility charge 1 Mobility   Pt received in bed and agreeable with max encouragement. Took x1 seated rest break before complete x1 STS to return to bed. Left with call bell in reach and NT present.   [place] Mobility Specialist

## 2021-08-13 NOTE — Progress Notes (Signed)
PROGRESS NOTE    Erik Chan  OAC:[id number] DOB: 06-11-1955 DOA: 07/30/2021 PCP: [name], MD  Chief Complaint  Patient presents with   Weakness    Brief Narrative:  66 year old with history of COPD, nonischemic cardiomyopathy with reduced EF 20% comes to the ER with painless jaundice and loss of appetite.  Apparently patient felt very weak on 6/15 and laid on the floor and was unable to get back in her bed.  EMS was called to help with this.  Due to progressive weakness patient was brought to the hospital.  Upon admission patient was noted to be hyponatremic, elevated creatinine, transaminitis.  Ammonia level was normal.  Right upper quadrant ultrasound showed pleural effusion, ascites.  GI was consulted for acute hepatitis along with nephrology and CHF team due to concerns of low output cardiac failure.  Underwent RHC showing biventricular failure and started on milrinone and Lasix.      Assessment & Plan:   Principal Problem:   Acute hepatitis Active Problems:   Acute liver failure   Acute combined systolic and diastolic congestive heart failure (HCC)   Hyponatremia   AKI (acute kidney injury) (HCC)   Paroxysmal atrial fibrillation (HCC)   NSVT (nonsustained ventricular tachycardia) (HCC)   COPD (chronic obstructive pulmonary disease) (HCC)   Pleural effusion   Non-insulin dependent type 2 diabetes mellitus (HCC)   Protein-calorie malnutrition, severe   Status post PICC central line placement   Pressure ulcer   Chronic systolic CHF (congestive heart failure) (HCC)   Coagulopathy (HCC)   Chronic right heart failure (HCC)   Assessment and Plan: Acute liver failure Significantly elevated bilirubin, elevated ast/alt  Bili continued downtrend today, follow MRCP with cholelithiasis without intrahepatic or extrahepatic biliary dilation, no findings to suggest choledocholithiasis Negative acute hepatitis panel, negative ANA, negative ASMA, negative a1at Ceruloplasmin  mildly elevated Negative EBV DNA PCR Mitochondrial ab negative, hemochromatosis DNA PCR negative, HSV PCR negative GI c/s, appreciate recs -> ? DILI.  Started on ursodiol.  Avoid hepatotoxic meds. S/p transjugular liver bx - follow results - showing moderate/severe cholestasis and minimal hepatitis (cholestatic hepatitis) -> ddx includes drug induced liver injury and less likely infectious etiologies.  "Though diagnostic features of congestive hepatopathy are not present, overlapping mild outflow obstruction can't be ruled out.'    Acute combined systolic and diastolic congestive heart failure (HCC) Right heart cath 6/22 with severe biventricular failure with low output, no significant gradient between RA and HW pressures, suggesting cardiac cirrhosis Echo 05/30/2021 with EF 20-25% 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 vs genetic cardiomyopathy, +/- PVC's Appreciate cardiology assistance Milrinone d/c'd per cards, loop on hold per cards spironolactone Midodrine for BP  Hyponatremia Continue to monitor, fluctuating Fluid restriction Due to hypervolemia, diuresis per cardiology Renal signed off at this point, expected Na to improve with restoration of euvolemia  NSVT (nonsustained ventricular tachycardia) (HCC) Amiodarone and mexiletine on hold with liver failure  Paroxysmal atrial fibrillation [facility]) Per cardiology No amio given liver failure  AKI (acute kidney injury) (HCC) Per renal, suspected cardiorenal/hepatorenal  Octreotide/midodrine -> will d/c octreotide and follow improved  COPD (chronic obstructive pulmonary disease) (HCC) No si/sx exacerbation  Pleural effusion CT abd/pelvis with bilateral pleural effusions, small ascites, anasarca Suspected related to volume overload with HF  Non-insulin dependent type 2 diabetes mellitus (HCC) A1c 02/2021 6.7 BG's on lower side - hold SSI, follow BG's Home jardiance on hold  Protein-calorie  malnutrition, severe noted  Status post PICC central line placement  Left IJ 6/22 Left IJ removed 7/1 PICC placed 7/1  Pressure ulcer Pressure Injury 07/31/21 Sacrum Stage 2 -  Partial thickness loss of dermis presenting as Anairis Knick shallow open injury with Nayelis Bonito red, pink wound bed without slough. (Active)  07/31/21 1830  Location: Sacrum  Location Orientation:   Staging: Stage 2 -  Partial thickness loss of dermis presenting as Aviona Martenson shallow open injury with Quinley Nesler red, pink wound bed without slough.  Wound Description (Comments):   Present on Admission: Yes          DVT prophylaxis: SCD Code Status: DNR Family Communication: none at bedside Disposition:   Status is: Inpatient Remains inpatient appropriate because: continued liver failure, worsening   Consultants:  IR Cardiology gastroenterology  Procedures:  Findings:   Hepatic wedge = 26 RA = 22 RV = 57/24  PA = 55/29 (38) PCW = 26 (v = 34) Fick cardiac output/index = 3.4/1.8 Therm CO/CI = 2.1/1.0 PVR = 3.2 WU FA sat = 97% PA sat = 59%, 59%  PAPi = 1.2   Assessment: 1. Severe biventricular failure with low output 2. 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 Remains extremely tenuous.   Antimicrobials:  Anti-infectives (From admission, onward)    None       Subjective: No new complaints  Objective: Vitals:   08/13/21 0318 08/13/21 0900 08/13/21 1159 08/13/21 1617  BP: (!) 95/58 103/60 96/67 (!) 90/59  Pulse: 83 69 68 69  Resp: 14 16 15 15   Temp: 97.8 F (36.6 C)  97.8 F (36.6 C) 97.9 F (36.6 C)  TempSrc: Oral  Oral Oral  SpO2: 94% 96% 96% 92%  Weight: 102.5 kg     Height:        Intake/Output Summary (Last 24 hours) at 08/13/2021 1834 Last data filed at 08/13/2021 1404 Gross per 24 hour  Intake 240 ml  Output 1800 ml  Net -1560 ml   Filed Weights   08/11/21 0619 08/12/21 0451 08/13/21 0318   Weight: 71.5 kg 67.9 kg 102.5 kg    Examination:  General: No acute distress. Scleral icterus, jaundice Cardiovascular: RRR Lungs: unlabored Neurological: Alert and oriented 3. Moves all extremities 4 with equal strength. Cranial nerves II through XII grossly intact. Extremities: No clubbing or cyanosis. No edema.   Data Reviewed: I have personally reviewed following labs and imaging studies  CBC: Recent Labs  Lab 08/09/21 0415 08/10/21 0444 08/11/21 0559 08/12/21 0355 08/13/21 0335  WBC 9.5 8.3 7.9 8.1 7.4  NEUTROABS 7.8* 6.7 6.4 6.4 5.8  HGB 11.6* 11.6* 11.9* 12.7* 12.8*  HCT 33.1* 33.0* 34.7* 34.7* 36.3*  MCV 89.2 87.8 89.2 87.6 89.2  PLT 100* 98* 101* 105* 102*    Basic Metabolic Panel: Recent Labs  Lab 08/09/21 0415 08/10/21 0444 08/11/21 0559 08/12/21 0355 08/13/21 0335  NA 127* 126* 126* 125* 126*  K 3.4* 3.0* 3.1* 3.6 3.4*  CL 82* 81* 81* 82* 82*  CO2 35* 34* 34* 32 31  GLUCOSE 103* 74 104* 99 98  BUN 21 20 15 13 13   CREATININE 1.04 0.97 0.86 0.78 0.79  CALCIUM 8.0* 7.9* 8.1* 8.0* 8.2*  MG 2.2 2.1 2.3 2.2 2.2  PHOS 3.9 4.3 4.2 3.7 3.6    GFR: Estimated Creatinine Clearance: 116 mL/min (by C-G formula based on SCr of 0.79 mg/dL).  Liver Function Tests: Recent Labs  Lab 08/09/21 0415 08/10/21 0444 08/11/21  0559 08/12/21 0355 08/13/21 0335  AST 156* 160* 171* 173* 173*  ALT 159* 158* 161* 168* 170*  ALKPHOS 168* 181* 192* 226* 224*  BILITOT 26.9* 24.0* 23.4* 23.3* 22.4*  PROT 5.4* 5.2* 5.4* 5.7* 5.8*  ALBUMIN 2.2* 2.1* 2.1* 2.1* 2.2*    CBG: Recent Labs  Lab 08/12/21 1604 08/12/21 2124 08/13/21 0659 08/13/21 1158 08/13/21 1615  GLUCAP 79 73 88 90 132*     No results found for this or any previous visit (from the past 240 hour(s)).       Radiology Studies: No results found.      Scheduled Meds:  Chlorhexidine Gluconate Cloth  6 each Topical Daily   digoxin  0.125 mg Oral Daily   feeding supplement  237 mL Oral  TID BM   Gerhardt's butt cream   Topical BID   lactulose  20 g Oral BID   midodrine  10 mg Oral TID WC   multivitamin with minerals  1 tablet Oral Daily   potassium chloride  40 mEq Oral Daily   sodium chloride flush  3 mL Intravenous Q12H   spironolactone  12.5 mg Oral Daily   torsemide  20 mg Oral Daily   ursodiol  300 mg Oral TID   Continuous Infusions:     LOS: 14 days    Time spent: 81    Lacretia Nicks, MD [facility]   To contact the attending provider between 7A-7P or the covering provider during after hours 7P-7A, please log into the web site [email or web address] and access using universal [place] password for that web site. If you do not have the password, please call the hospital operator.  08/13/2021, 6:34 PM

## 2021-08-14 ENCOUNTER — Telehealth (HOSPITAL_COMMUNITY): Payer: Self-pay | Admitting: Pharmacy Technician

## 2021-08-14 ENCOUNTER — Other Ambulatory Visit (HOSPITAL_COMMUNITY): Payer: Self-pay

## 2021-08-14 DIAGNOSIS — I5041 Acute combined systolic (congestive) and diastolic (congestive) heart failure: Secondary | ICD-10-CM | POA: Diagnosis not present

## 2021-08-14 DIAGNOSIS — Z66 Do not resuscitate: Secondary | ICD-10-CM

## 2021-08-14 DIAGNOSIS — E43 Unspecified severe protein-calorie malnutrition: Secondary | ICD-10-CM

## 2021-08-14 DIAGNOSIS — B179 Acute viral hepatitis, unspecified: Secondary | ICD-10-CM | POA: Diagnosis not present

## 2021-08-14 DIAGNOSIS — I50812 Chronic right heart failure: Secondary | ICD-10-CM | POA: Diagnosis not present

## 2021-08-14 DIAGNOSIS — K7200 Acute and subacute hepatic failure without coma: Secondary | ICD-10-CM

## 2021-08-14 LAB — COMPREHENSIVE METABOLIC PANEL
ALT: 163 U/L — ABNORMAL HIGH (ref 0–44)
AST: 150 U/L — ABNORMAL HIGH (ref 15–41)
Albumin: 2.2 g/dL — ABNORMAL LOW (ref 3.5–5.0)
Alkaline Phosphatase: 214 U/L — ABNORMAL HIGH (ref 38–126)
Anion gap: 15 (ref 5–15)
BUN: 13 mg/dL (ref 8–23)
CO2: 35 mmol/L — ABNORMAL HIGH (ref 22–32)
Calcium: 8.2 mg/dL — ABNORMAL LOW (ref 8.9–10.3)
Chloride: 82 mmol/L — ABNORMAL LOW (ref 98–111)
Creatinine, Ser: 0.92 mg/dL (ref 0.61–1.24)
GFR, Estimated: >60 mL/min (ref >60)
Glucose, Bld: 103 mg/dL — ABNORMAL HIGH (ref 70–99)
Potassium: 3.2 mmol/L — ABNORMAL LOW (ref 3.5–5.1)
Sodium: 132 mmol/L — ABNORMAL LOW (ref 135–145)
Total Bilirubin: 20.9 mg/dL (ref 0.3–1.2)
Total Protein: 5.9 g/dL — ABNORMAL LOW (ref 6.5–8.1)

## 2021-08-14 LAB — PROTIME-INR
INR: 1.4 — ABNORMAL HIGH (ref 0.8–1.2)
Prothrombin Time: 16.9 seconds — ABNORMAL HIGH (ref 11.4–15.2)

## 2021-08-14 LAB — GLUCOSE, CAPILLARY
Glucose-Capillary: 91 mg/dL (ref 70–99)
Glucose-Capillary: 80 mg/dL (ref 70–99)
Glucose-Capillary: 84 mg/dL (ref 70–99)
Glucose-Capillary: 79 mg/dL (ref 70–99)

## 2021-08-14 LAB — COOXEMETRY PANEL
Carboxyhemoglobin: 3.2 % — ABNORMAL HIGH (ref 0.5–1.5)
Methemoglobin: 0.7 % (ref 0.0–1.5)
O2 Saturation: 75 %
Total hemoglobin: 13.3 g/dL (ref 12.0–16.0)

## 2021-08-14 MED ORDER — APIXABAN 5 MG PO TABS
5.0000 mg | ORAL_TABLET | Freq: Two times a day (BID) | ORAL | Status: DC
  Administered 2021-08-14 – 2021-08-16 (×5): 5 mg via ORAL
  Filled 2021-08-14 (×5): qty 1

## 2021-08-14 MED ORDER — MIDODRINE HCL 5 MG PO TABS
15.0000 mg | ORAL_TABLET | Freq: Three times a day (TID) | ORAL | Status: DC
  Administered 2021-08-14 – 2021-08-16 (×7): 15 mg via ORAL
  Filled 2021-08-14 (×7): qty 3

## 2021-08-14 MED ORDER — POTASSIUM CHLORIDE CRYS ER 20 MEQ PO TBCR
40.0000 meq | EXTENDED_RELEASE_TABLET | Freq: Once | ORAL | Status: DC
  Filled 2021-08-14: qty 2

## 2021-08-14 NOTE — Progress Notes (Addendum)
PT Cancellation Note  Patient Details Name: Erik Chan MRN: [id number] DOB: [birth year]   Cancelled Treatment:    Reason Eval/Treat Not Completed: Other (comment), pt sleeping, mother requesting this PTA let him sleep. Will check back to continue with PT POC.  13:30: re-attempted, pt declining and asking this PTA to come back in 2 hours. Max encouragement and education provided with pt continuing to decline.   Lenora Boys. PTA Acute Rehabilitation Services Office: [phone]    Catalina Antigua 08/14/2021, 12:06 PM

## 2021-08-14 NOTE — TOC Benefit Eligibility Note (Signed)
Patient Product/process development scientist completed.    The patient is currently admitted and upon discharge could be taking Eliquis 5 mg.  The current 30 day co-pay is, $141.81 due to being in Coverage Gap (donut hole).   The patient is insured through [facility] Medicare Part D     Roland Earl, CPhT Pharmacy Patient Advocate Specialist [facility] Patient Advocate Team Direct Number: [phone]  Fax: [phone]

## 2021-08-14 NOTE — Progress Notes (Signed)
Physical Therapy Treatment Patient Details Name: Erik Chan MRN: [id number] DOB: 28-Jun-1955 Today's Date: 08/14/2021   History of Present Illness Pt is a 66 y.o. male admitted 07/30/21 with jaundice, generalized weakness (reports not being able to get up since 07/25/21). 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 S/p RHC 6/22 with biventricular failure, concern for low output. PMH includes HF (EF 20%), NICM, COPD, DM2, fibromyalgia.    PT Comments    Pt received supine and agreeable to session with continued progress. Pt requiring up to min assist for bed mobility and up to mod assist to come to standing with pt having most difficulty powering up from low recliner and needing cues to scoot toward edge and for anterior weight shift. Pt demonstrating increased tolerance for ambulation with x2 bouts of 10' ambulation to door of room, encouraged pt to push further but pt declining. Pt needing significantly increased time between all mobility activities to rest and "prepare" despite encouragement to shorten rest time. Pt continues to benefit from skilled PT services to progress toward functional mobility goals.    Recommendations for follow up therapy are one component of a multi-disciplinary discharge planning process, led by the attending physician.  Recommendations may be updated based on patient status, additional functional criteria and insurance authorization.  Follow Up Recommendations  Skilled nursing-short term rehab (<3 hours/day) Can patient physically be transported by private vehicle: Yes   Assistance Recommended at Discharge Intermittent Supervision/Assistance  Patient can return home with the following A lot of help with walking and/or transfers;A lot of help with bathing/dressing/bathroom;Assistance with cooking/housework;Help with stairs or ramp for entrance;Assist for transportation   Equipment  Recommendations  Hospital bed;Wheelchair cushion (measurements PT);Wheelchair (measurements PT)    Recommendations for Other Services       Precautions / Restrictions Precautions Precautions: Fall Precaution Comments: watch sats and BP Restrictions Weight Bearing Restrictions: No     Mobility  Bed Mobility Overal bed mobility: Needs Assistance Bed Mobility: Rolling Rolling: Supervision   Supine to sit: Min assist, HOB elevated Sit to supine: Min assist   General bed mobility comments: patient is now on air matress    Transfers Overall transfer level: Needs assistance Equipment used: Rolling walker (2 wheels) Transfers: Sit to/from Stand, Bed to chair/wheelchair/BSC Sit to Stand: Min assist, Mod assist           General transfer comment: mod a to initiate stand with pt to power up rest of wat, from recliner and EOB    Ambulation/Gait Ambulation/Gait assistance: Min assist Gait Distance (Feet): 20 Feet (10' x2 with seated rest) Assistive device: Rolling walker (2 wheels) Gait Pattern/deviations: Step-through pattern, Decreased step length - right, Decreased step length - left, Shuffle, Trunk flexed Gait velocity: decr     General Gait Details: slow steady gait with RW, no LOB, HR up to 75bpm, O2 >90% on RA   Stairs             Wheelchair Mobility    Modified Rankin (Stroke Patients Only)       Balance Overall balance assessment: Needs assistance Sitting-balance support: No upper extremity supported, Feet supported Sitting balance-Leahy Scale: Fair Sitting balance - Comments: static sitting unsupported   Standing balance support: Bilateral upper extremity supported, Reliant on assistive device for balance Standing balance-Leahy Scale: Poor Standing balance comment: Reliant on RW  Cognition Arousal/Alertness: Awake/alert Behavior During Therapy: Flat affect                                    General Comments: No changes from previous sessions.        Exercises      General Comments General comments (skin integrity, edema, etc.): agreeable for hmw distance ambualtion, significant time needed between all tasks,      Pertinent Vitals/Pain Pain Assessment Pain Assessment: Faces Faces Pain Scale: Hurts a little bit Pain Location: low back and bottom Pain Descriptors / Indicators: Aching, Tender Pain Intervention(s): Monitored during session, Limited activity within patient's tolerance    Home Living                          Prior Function            PT Goals (current goals can now be found in the care plan section) Acute Rehab PT Goals Patient Stated Goal: to feel better PT Goal Formulation: With patient Time For Goal Achievement: 08/26/21    Frequency    Min 3X/week      PT Plan Current plan remains appropriate    Co-evaluation              AM-PAC PT "6 Clicks" Mobility   Outcome Measure  Help needed turning from your back to your side while in a flat bed without using bedrails?: A Little Help needed moving from lying on your back to sitting on the side of a flat bed without using bedrails?: A Little Help needed moving to and from a bed to a chair (including a wheelchair)?: A Lot Help needed standing up from a chair using your arms (e.g., wheelchair or bedside chair)?: A Lot Help needed to walk in hospital room?: A Lot Help needed climbing 3-5 steps with a railing? : Total 6 Click Score: 13    End of Session Equipment Utilized During Treatment: Gait belt Activity Tolerance: Patient tolerated treatment well Patient left: with call bell/phone within reach;in bed;with family/visitor present Nurse Communication: Mobility status PT Visit Diagnosis: Unsteadiness on feet (R26.81);Muscle weakness (generalized) (M62.81);Difficulty in walking, not elsewhere classified (R26.2)     Time: 2683-4196 PT Time Calculation (min) (ACUTE ONLY):  41 min  Charges:  $Gait Training: 8-22 mins $Therapeutic Activity: 23-37 mins                     Zahava Quant R. PTA Acute Rehabilitation Services Office: [phone]    Catalina Antigua 08/14/2021, 4:27 PM

## 2021-08-14 NOTE — Telephone Encounter (Signed)
Pharmacy Patient Advocate Encounter  Insurance verification completed.    The patient is insured through [facility] Part D    The patient is currently admitted and ran test claims for the following: Eliquis 5 mg.  Copays and coinsurance results were relayed to Inpatient clinical team.

## 2021-08-14 NOTE — Progress Notes (Addendum)
Patient ID: Erik Chan, male   DOB: [birth year], 66 y.o.   MRN: [id number]     Advanced Heart Failure Rounding Note  PCP-Cardiologist: Erik Guadalajara, MD  HF: Dr. Gala Chan  Subjective:    06/26: Milrinone cut back to 0.125 mcg. 06/29: Lasix gtt stopped >>> PO Torsemide. Liver biopsy. 07/03: Milrinone stopped  CO-OX 75% off milrinone.  CVP 5  On 20 mg Torsemide daily. 3.1L UOP charted yesterday. Bed weights not accurate. Will not stand for weight.  LFTs remain elevated with tbili 21.  Liver biopsy with moderate-severe cholestasis, minimal hepatitis (cholestatic hepatitis). No diagnostic features of congestive hepatopathy. Started on ursodiol.  SBP lower, 70s-80s.  Remains in AF with controlled rate.   No dyspnea. Remains quite weak, frequently declines mobility.  RHC  6/22  Hepatic wedge = 26 RA = 22 RV = 57/24  PA = 55/29 (38) PCW = 26 (v = 34) Fick cardiac output/index = 3.4/1.8 Therm CO/CI = 2.1/1.0 PVR = 3.2 WU FA sat = 97% PA sat = 59%, 59%  PAPi = 1.2  Assessment: 1. Severe biventricular failure with low output 2. 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    Objective:   Weight Range: 102.5 kg Body mass index is 29.02 kg/m.   Vital Signs:   Temp:  [97.7 F (36.5 C)-97.9 F (36.6 C)] 97.7 F (36.5 C) (07/04 2300) Pulse Rate:  [68-74] 73 (07/04 2300) Resp:  [15-18] 18 (07/04 2300) BP: (90-103)/(58-67) 95/58 (07/04 2300) SpO2:  [90 %-96 %] 90 % (07/04 2300) Last BM Date : 08/09/21  Weight change: Filed Weights   08/11/21 0619 08/12/21 0451 08/13/21 0318  Weight: 71.5 kg 67.9 kg 102.5 kg    Intake/Output:   Intake/Output Summary (Last 24 hours) at 08/14/2021 2694 Last data filed at 08/14/2021 0400 Gross per 24 hour  Intake 440 ml  Output 3100 ml  Net -2660 ml      Physical Exam   General:  Lying comfortably in bed. Weak appearing HEENT: normal Neck: supple. no JVD. Carotids 2+ bilat; no  bruits.  Cor: PMI nondisplaced. Irregular rate & rhythm. No rubs, gallops or murmurs. Lungs: clear Abdomen: soft, nontender, nondistended.  Extremities: no cyanosis, clubbing, rash, edema Neuro: alert & orientedx3, cranial nerves grossly intact. moves all 4 extremities w/o difficulty. Affect pleasant   Telemetry   AF 60s-70s (personally reviewed)   Labs    CBC Recent Labs    08/12/21 0355 08/13/21 0335  WBC 8.1 7.4  NEUTROABS 6.4 5.8  HGB 12.7* 12.8*  HCT 34.7* 36.3*  MCV 87.6 89.2  PLT 105* 102*   Basic Metabolic Panel Recent Labs    85/46/27 0355 08/13/21 0335 08/14/21 0248  NA 125* 126* 132*  K 3.6 3.4* 3.2*  CL 82* 82* 82*  CO2 32 31 35*  GLUCOSE 99 98 103*  BUN 13 13 13   CREATININE 0.78 0.79 0.92  CALCIUM 8.0* 8.2* 8.2*  MG 2.2 2.2  --   PHOS 3.7 3.6  --    Liver Function Tests Recent Labs    08/13/21 0335 08/14/21 0248  AST 173* 150*  ALT 170* 163*  ALKPHOS 224* 214*  BILITOT 22.4* 20.9*  PROT 5.8* 5.9*  ALBUMIN 2.2* 2.2*   No results for input(s): "LIPASE", "AMYLASE" in the last 72 hours.  Cardiac Enzymes No results for input(s): "CKTOTAL", "CKMB", "CKMBINDEX", "TROPONINI" in the last 72 hours.  BNP: BNP (last 3 results) Recent Labs    02/01/21  1522 02/13/21 1709  BNP 2,411.9* 1,831.1*    ProBNP (last 3 results) No results for input(s): "PROBNP" in the last 8760 hours.   D-Dimer No results for input(s): "DDIMER" in the last 72 hours. Hemoglobin A1C No results for input(s): "HGBA1C" in the last 72 hours. Fasting Lipid Panel No results for input(s): "CHOL", "HDL", "LDLCALC", "TRIG", "CHOLHDL", "LDLDIRECT" in the last 72 hours. Thyroid Function Tests No results for input(s): "TSH", "T4TOTAL", "T3FREE", "THYROIDAB" in the last 72 hours.  Invalid input(s): "FREET3"  Other results:   Imaging    No results found.   Medications:     Scheduled Medications:  Chlorhexidine Gluconate Cloth  6 each Topical Daily   digoxin   0.125 mg Oral Daily   feeding supplement  237 mL Oral TID BM   Gerhardt's butt cream   Topical BID   lactulose  20 g Oral BID   midodrine  10 mg Oral TID WC   multivitamin with minerals  1 tablet Oral Daily   potassium chloride  40 mEq Oral Daily   sodium chloride flush  3 mL Intravenous Q12H   spironolactone  12.5 mg Oral Daily   torsemide  20 mg Oral Daily   ursodiol  300 mg Oral TID    Infusions:    PRN Medications: acetaminophen, albuterol, guaiFENesin, hydrALAZINE, iohexol, ipratropium-albuterol, morphine injection, ondansetron (ZOFRAN) IV, mouth rinse, senna-docusate, traZODone    Patient Profile    65 y.o. male with history of chronic biventricular HF/NICM, PVCs/NSVT, COPD, hx pleural effusion s/p right thoracentesis 01/23, DM II. Admitted with progressive weakness in setting of elevated LFTs, hyponatremia, AKI and concern for a/c biventricular HF with low-output   Assessment/Plan   1. Chronic Biventricular HFrEF/ NICM - Echo (1/23): severely reduced EF < 20% and severely reduced RV. Moderate to severe MR.  - L/RHC (1/23): moderate non-obstructive CAD. EF < 10% - cMRI (1/23): LVEF 10%, severe biventricular heart failure, basal septal wall mid-wall LGE, apical inferior subendocardial LGE, and RV insertion non specific scar pattern.  - CT chest (1/23): without evidence for pulmonary sarcoidosis.   - Myeloma panel and SPEP/UPEP negative. ACE level OK (37). - Etiology for CM => isolated cardiac sarcoidosis vs prior myocarditis vs genetic cardiomyopathy, +/- PVCs - Cardiac PET previously set up at [facility], has not completed. Genetic testing sent 03/04/21. - Echo 05/30/21 EF 20-25% RV severely reduced.  - Referred to EP for ICD in April - has not scheduled appointment.  - RHC 08/01/21 with low output and elevated filling pressures. - Co-ox 75% off milrinone - CVP 5. Brisk UOP yesterday. Hold PO Torsemide today. - SBP 70s-80s. Continue midodrine 10 mg TID. Can increase as needed -  Continue digoxin 0.125 - Continue spiro 12.5 mg daily - HF optimized. No diagnostic features of congestive hepatopathy on liver biopsy. Liver biopsy >> cholestatic hepatitis, differential drug-induced liver injury and less likely infectious  2. Atrial fibrillation - New, noted on tele this admit.  - Rate controlled - Limited rhythm control options. No amiodarone due to liver injury - Not a candidate for anticoagulation with liver injury - No change  3. Acute liver failure - US abdomen R pleural effusion, + ascites, increased echogenicity of liver, large gallstone with gallbladder sludge. - CT abdomen pelvis - bilateral pleural effusions, small ascites, anasarca, + gallstone - MRCP- no focal liver abnormality, cholelithiasis without biliary dilatation, no choledocholithiasis, moderate to large b/l pleural effusions, mesenteric edema with ascites, diffuse body wall edema - Hepatitis panel negative. ANA  negative. Hemochromatosis genetics still pending. - [facility] following. 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  ?Drug related, ?autoimmune. No diagnostic features of congestive hepatopathy. - Total Bili 24.6>24.9>25.1>26.2>26.1>29>30>27>24>23>22>21   4. Hyponatremia - Hypervolemic hyponatremia - Na 120>121>122 >123 >121 >122>126>127>126>125>126>132.  - Keep po fluid restricted.   5. AKI: - Scr baseline ~ 1, up to 1.6 this admit.  - Now back to baseline.  - Follow renal function with diuretics - Midodrine to support BP   6. PVCs/NSVT - >20% PVC burden during admit 01/23 - Rare PVCs on tele - Will not restart amiodarone and mexiletine with liver injury - No OSA on sleep study 2/23.   7. COPD - Former heavy smoker. Quit smoking 01/07/21.   8. H/o Pleural Effusion - s/p rt thoracentesis 1/23 w/ 1200 cc fluid removal. - moderate to large b/l pleural effusions on MR this admit  9. 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 No option for heart-liver transplant.  - Palliative Care on board. Appreciate assistance. - Now DNR   Continue to mobilize. Very weak. Encouraged him to participate with PT/OT. SNF for rehab recommended at discharge.    Length of Stay: [place], [place], PA-C  08/14/2021, 7:02 AM  Advanced Heart Failure Team Pager [phone] (M-F; 7a - 5p)  Please contact [facility] for night-coverage after hours (5p -7a ) and weekends on [email or web address]  Patient seen with PA, agree with the above note.   Bilirubin very slowly trending down.  Liver biopsy with cholestasis, ?drug-induced liver injury.  Not suggestive of congestive hepatopathy.   Co-ox stable at 75% with low CVP.  SBP 80s-90s.   General: NAD, thin/frail Neck: No JVD, no thyromegaly or thyroid nodule.  Lungs: Clear to auscultation bilaterally with normal respiratory effort. CV: Nondisplaced PMI.  Heart irregular S1/S2, no S3/S4, no murmur.  No peripheral edema.   Abdomen: Soft, nontender, no hepatosplenomegaly, no distention.  Skin: Jaundice  Neurologic: Alert and oriented x 3.  Psych: Normal affect. Extremities: No clubbing or cyanosis.  HEENT: Normal.   With low BP, will increase midodrine to 15 mg tid.   Cholestatic picture with drug-induced liver injury most likely from liver biopsy.  ?Due to amiodarone (now off).  Slow improvement of liver indices, will check INR and then check with GI about timing of restarting anticoagulation for chronic AF.    Needs PT.   Loralie Champagne 08/14/2021 8:41 AM

## 2021-08-14 NOTE — Plan of Care (Signed)
  Problem: Education: Goal: Ability to describe self-care measures that may prevent or decrease complications (Diabetes Survival Skills Education) will improve Outcome: Progressing Goal: Individualized Educational Video(s) Outcome: Progressing   Problem: Coping: Goal: Ability to adjust to condition or change in health will improve Outcome: Progressing   Problem: Fluid Volume: Goal: Ability to maintain a balanced intake and output will improve Outcome: Progressing   Problem: Health Behavior/Discharge Planning: Goal: Ability to identify and utilize available resources and services will improve Outcome: Progressing Goal: Ability to manage health-related needs will improve Outcome: Not Progressing   Problem: Metabolic: Goal: Ability to maintain appropriate glucose levels will improve Outcome: Progressing   Problem: Nutritional: Goal: Maintenance of adequate nutrition will improve Outcome: Not Progressing Goal: Progress toward achieving an optimal weight will improve Outcome: Not Progressing   Problem: Skin Integrity: Goal: Risk for impaired skin integrity will decrease Outcome: Progressing   Problem: Tissue Perfusion: Goal: Adequacy of tissue perfusion will improve Outcome: Progressing   Problem: Education: Goal: Knowledge of General Education information will improve Description: Including pain rating scale, medication(s)/side effects and non-pharmacologic comfort measures Outcome: Progressing   Problem: Health Behavior/Discharge Planning: Goal: Ability to manage health-related needs will improve Outcome: Not Progressing   Problem: Clinical Measurements: Goal: Ability to maintain clinical measurements within normal limits will improve Outcome: Progressing Goal: Will remain free from infection Outcome: Progressing Goal: Diagnostic test results will improve Outcome: Progressing Goal: Respiratory complications will improve Outcome: Progressing Goal: Cardiovascular  complication will be avoided Outcome: Progressing   Problem: Activity: Goal: Risk for activity intolerance will decrease Outcome: Not Progressing   Problem: Nutrition: Goal: Adequate nutrition will be maintained Outcome: Not Progressing   Problem: Coping: Goal: Level of anxiety will decrease Outcome: Progressing   Problem: Elimination: Goal: Will not experience complications related to bowel motility Outcome: Not Progressing Goal: Will not experience complications related to urinary retention Outcome: Progressing   Problem: Pain Managment: Goal: General experience of comfort will improve Outcome: Progressing   Problem: Safety: Goal: Ability to remain free from injury will improve Outcome: Progressing   Problem: Skin Integrity: Goal: Risk for impaired skin integrity will decrease Outcome: Progressing   Problem: Education: Goal: Understanding of CV disease, CV risk reduction, and recovery process will improve Outcome: Progressing Goal: Individualized Educational Video(s) Outcome: Progressing   Problem: Activity: Goal: Ability to return to baseline activity level will improve Outcome: Not Progressing   Problem: Cardiovascular: Goal: Ability to achieve and maintain adequate cardiovascular perfusion will improve Outcome: Progressing Goal: Vascular access site(s) Level 0-1 will be maintained Outcome: Progressing   Problem: Health Behavior/Discharge Planning: Goal: Ability to safely manage health-related needs after discharge will improve Outcome: Not Progressing

## 2021-08-14 NOTE — Progress Notes (Addendum)
PROGRESS NOTE        PATIENT DETAILS Name: Erik Chan Age: 66 y.o. Sex: male Date of Birth: Jun 21, 1955 Admit Date: 07/30/2021 Admitting Physician Vianne Bulls, MD [id number], Lattie Haw, MD  Brief Summary: Patient is a 66 y.o.  male history of chronic HFpEF/biventricular heart failure-who presented with painless jaundice-found to have drug-induced liver injury on a liver biopsy.  See below for further details.  Procedures: 6/22>> left IJ 6/29>> liver biopsy 7/1>> IJ removed-PICC placed  Consults: GI, cardiology, nephrology, palliative care,IR  Subjective: Lying comfortably in bed-denies any chest pain or shortness of breath.  Objective: Vitals: Blood pressure (!) 90/56, pulse 64, temperature 97.9 F (36.6 C), temperature source Oral, resp. rate 15, height 6\' 2"  (1.88 m), weight 102.5 kg, SpO2 95 %.   Exam: Gen Exam:Alert awake-not in any distress.  Frail-grossly jaundiced. HEENT:atraumatic, normocephalic Chest: B/L clear to auscultation anteriorly CVS:S1S2 regular Abdomen:soft non tender, non distended Extremities:no edema Neurology: Non focal-nonfocal Skin: no rash  Pertinent Labs/Radiology:    Latest Ref Rng & Units 08/13/2021    3:35 AM 08/12/2021    3:55 AM 08/11/2021    5:59 AM  CBC  WBC 4.0 - 10.5 K/uL 7.4  8.1  7.9   Hemoglobin 13.0 - 17.0 g/dL 12.8  12.7  11.9   Hematocrit 39.0 - 52.0 % 36.3  34.7  34.7   Platelets 150 - 400 K/uL 102  105  101     Lab Results  Component Value Date   NA 132 (L) 08/14/2021   K 3.2 (L) 08/14/2021   CL 82 (L) 08/14/2021   CO2 35 (H) 08/14/2021      Assessment/Plan: 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  Coags okay-mentation preserved-no signs of acute liver failure GI recommending ursodiol 3 times daily-avoid nephrotoxic agents-on follow-up  LFTs/coags periodically  Acute on chronic combined HFrEF/HFpEF/biventricular heart failure: Advanced heart failure team following-no longer on milrinone/IV diuretics-BP soft but stable-volume status reasonable-remains on digoxin/spironolactone/midodrine.  Persistent atrial fibrillation: Rate controlled-not a candidate for anticoagulation with severe liver injury.  BP soft-Limited options with rate control agents.  Not a candidate for amiodarone due to ongoing hepatitis.  Hyponatremia: Due to CHF-mild-on diuretics-follow.  Hypokalemia: Repleted.  AKI: Hemodynamically mediated-cardiorenal/hepatorenal syndrome-improved-remains on midodrine-no longer on octreotide.  COPD: Continue bronchodilators-not in exacerbation.  Bilateral pleural effusion: Likely related to CHF-given stability-no indication for paracentesis.  DM-2 current (A1c 6.7 on 01/23): Continue to monitor CBGs-SSI and hold due to hypoglycemic episode.  Recent Labs    08/13/21 1615 08/13/21 2117 08/14/21 0637  GLUCAP 132* 125* 60    Nutrition Status: Nutrition Problem: Severe Malnutrition Etiology: chronic illness (CHF, COPD) Signs/Symptoms: severe muscle depletion, severe fat depletion Interventions: Liberalize Diet, Ensure Enlive (each supplement provides 350kcal and 20 grams of protein), MVI  Pressure Ulcer: Pressure Injury 07/31/21 Sacrum Stage 2 -  Partial thickness loss of dermis presenting as a shallow open injury with a red, pink wound bed without slough. (Active)  07/31/21 1830  Location: Sacrum  Location Orientation:   Staging: Stage 2 -  Partial thickness loss of dermis presenting as a shallow open injury with a red, pink wound bed without slough.  Wound Description (Comments):   Present on Admission: Yes  Dressing Type Foam - Lift dressing to assess site every  shift 08/13/21 2015   Obesity: Estimated body mass index is 29.02 kg/m as calculated from the following:   Height as of this encounter: 6\' 2"   (1.88 m).   Weight as of this encounter: 102.5 kg.   Code status:   Code Status: DNR   DVT Prophylaxis: SCD's Start: 08/02/21 0832 SCDs Start: 07/30/21 2025    Family Communication: None at bedside   Disposition Plan: Status is: Inpatient Remains inpatient appropriate because: Severe cholestatic hepatitis-jaundice-not yet stable for discharge.   Planned Discharge Destination: SNF   Diet: Diet Order             Diet regular Room service appropriate? Yes; Fluid consistency: Thin; Fluid restriction: 1200 mL Fluid  Diet effective now                     Antimicrobial agents: Anti-infectives (From admission, onward)    None        MEDICATIONS: Scheduled Meds:  Chlorhexidine Gluconate Cloth  6 each Topical Daily   digoxin  0.125 mg Oral Daily   feeding supplement  237 mL Oral TID BM   Gerhardt's butt cream   Topical BID   lactulose  20 g Oral BID   midodrine  15 mg Oral TID WC   multivitamin with minerals  1 tablet Oral Daily   potassium chloride  40 mEq Oral Daily   potassium chloride  40 mEq Oral Once   sodium chloride flush  3 mL Intravenous Q12H   spironolactone  12.5 mg Oral Daily   ursodiol  300 mg Oral TID   Continuous Infusions: PRN Meds:.acetaminophen, albuterol, guaiFENesin, hydrALAZINE, iohexol, ipratropium-albuterol, morphine injection, ondansetron (ZOFRAN) IV, mouth rinse, senna-docusate, traZODone   I have personally reviewed following labs and imaging studies  LABORATORY DATA: CBC: Recent Labs  Lab 08/09/21 0415 08/10/21 0444 08/11/21 0559 08/12/21 0355 08/13/21 0335  WBC 9.5 8.3 7.9 8.1 7.4  NEUTROABS 7.8* 6.7 6.4 6.4 5.8  HGB 11.6* 11.6* 11.9* 12.7* 12.8*  HCT 33.1* 33.0* 34.7* 34.7* 36.3*  MCV 89.2 87.8 89.2 87.6 89.2  PLT 100* 98* 101* 105* 102*    Basic Metabolic Panel: Recent Labs  Lab 08/09/21 0415 08/10/21 0444 08/11/21 0559 08/12/21 0355 08/13/21 0335 08/14/21 0248  NA 127* 126* 126* 125* 126* 132*  K 3.4*  3.0* 3.1* 3.6 3.4* 3.2*  CL 82* 81* 81* 82* 82* 82*  CO2 35* 34* 34* 32 31 35*  GLUCOSE 103* 74 104* 99 98 103*  BUN 21 20 15 13 13 13   CREATININE 1.04 0.97 0.86 0.78 0.79 0.92  CALCIUM 8.0* 7.9* 8.1* 8.0* 8.2* 8.2*  MG 2.2 2.1 2.3 2.2 2.2  --   PHOS 3.9 4.3 4.2 3.7 3.6  --     GFR: Estimated Creatinine Clearance: 100.9 mL/min (by C-G formula based on SCr of 0.92 mg/dL).  Liver Function Tests: Recent Labs  Lab 08/10/21 0444 08/11/21 0559 08/12/21 0355 08/13/21 0335 08/14/21 0248  AST 160* 171* 173* 173* 150*  ALT 158* 161* 168* 170* 163*  ALKPHOS 181* 192* 226* 224* 214*  BILITOT 24.0* 23.4* 23.3* 22.4* 20.9*  PROT 5.2* 5.4* 5.7* 5.8* 5.9*  ALBUMIN 2.1* 2.1* 2.1* 2.2* 2.2*   No results for input(s): "LIPASE", "AMYLASE" in the last 168 hours. Recent Labs  Lab 08/08/21 1005  AMMONIA 25    Coagulation Profile: Recent Labs  Lab 08/10/21 0444  INR 1.4*    Cardiac Enzymes: No results for input(s): "CKTOTAL", "CKMB", "CKMBINDEX", "TROPONINI" in  the last 168 hours.  BNP (last 3 results) No results for input(s): "PROBNP" in the last 8760 hours.  Lipid Profile: No results for input(s): "CHOL", "HDL", "LDLCALC", "TRIG", "CHOLHDL", "LDLDIRECT" in the last 72 hours.  Thyroid Function Tests: No results for input(s): "TSH", "T4TOTAL", "FREET4", "T3FREE", "THYROIDAB" in the last 72 hours.  Anemia Panel: No results for input(s): "VITAMINB12", "FOLATE", "FERRITIN", "TIBC", "IRON", "RETICCTPCT" in the last 72 hours.  Urine analysis:    Component Value Date/Time   COLORURINE AMBER (A) 07/30/2021 2119   APPEARANCEUR HAZY (A) 07/30/2021 2119   LABSPEC 1.020 07/30/2021 2119   PHURINE 5.5 07/30/2021 2119   GLUCOSEU >=500 (A) 07/30/2021 2119   HGBUR NEGATIVE 07/30/2021 2119   BILIRUBINUR LARGE (A) 07/30/2021 2119   KETONESUR NEGATIVE 07/30/2021 2119   PROTEINUR 30 (A) 07/30/2021 2119   NITRITE NEGATIVE 07/30/2021 2119   LEUKOCYTESUR NEGATIVE 07/30/2021 2119    Sepsis  Labs: Lactic Acid, Venous No results found for: "LATICACIDVEN"  MICROBIOLOGY: No results found for this or any previous visit (from the past 240 hour(s)).  RADIOLOGY STUDIES/RESULTS: No results found.   LOS: 15 days   Jeoffrey Massed, MD  [facility]    To contact the attending provider between 7A-7P or the covering provider during after hours 7P-7A, please log into the web site [email or web address] and access using universal [place] password for that web site. If you do not have the password, please call the hospital operator.  08/14/2021, 9:54 AM

## 2021-08-14 NOTE — Progress Notes (Signed)
T. bili/AST/ALT/ALP  20.9/150/163/214 on 08/14/2021 22.4/173/170/224 on 08/13/2021  Continue ursodiol 300 mg 3 times a day.  It will likely take several weeks for LFTs to normalize.  Hopefully ursodiol will help with cholestatic hepatitis likely related to drug induced liver injury.

## 2021-08-14 NOTE — Progress Notes (Signed)
Discussed anticoagulation and antiarrhythmic therapy with GI.   INR 1.4 and platelets now trending above 100. Will add eliquis 5 mg BID. Continue to monitor CBC.  Etiology of drug-induced liver injury not certain, cannot exclude amiodarone or mexiletine. It is recommended to avoid both of these drugs in the future.

## 2021-08-14 NOTE — Progress Notes (Signed)
This chaplain responded to PMT consult for creating/updating the Pt. Advance Directive:HCPOA.   The chaplain provided Pt. AD education. The chaplain answered the Pt. and the Pt. mother-Maryanne clarifying questions. The chaplain provided education on the notary services for Advance Directive's only. The hospital doesn't provide notary services for durable POA.  The Pt. named Son Barkan as his healthcare agent. If this person is unable or unwilling to serve as the Pt. healthcare agent, the Pt. next choice is Elby Beck.  The chaplain gave the Pt. the original AD along with two copies. The chaplain scanned the Pt. HCPOA in the Pt. EMR.  This chaplain is available for F/U spiritual care as needed.  Chaplain Stephanie Acre [phone]

## 2021-08-14 NOTE — Progress Notes (Signed)
Palliative Care Progress Note, Assessment & Plan   Patient Name: Erik Chan       Date: 08/14/2021 DOB: 09-20-1955  Age: 66 y.o. MRN#: [id number] Attending Physician: Maretta Bees, MD Primary Care Physician: [name], MD Admit Date: 07/30/2021  Reason for Consultation/Follow-up: Establishing goals of care  Subjective: Patient is lying in bed in no apparent distress.  He acknowledges my presence and is able to make his wishes known.  No family present at bedside.   HPI: Pt is a 66 y.o. male  with PMH of biventricular failure, tobacco abuse, COPD, diabetes type 2, and nonischemic cardiomyopathy (EF 20%) admitted on 07/30/2021 with fatigue, loss of appetite, and painless jaundice.  Patient is being treated for hyponatremia, AKI, hyperbilirubin, elevated LFTs (MELD score 32).  MRCP revealed cholelithiasis without biliary dilation revealed.   Liver biopsy revealed moderate/severe cholestasis and minimal hepatitis. Pt has been diagnosed with drug induced liver injury (no heptaopathy but mild outflow obstruction cannot be ruled out).   PMT was consulted to discuss goals of care.  Summary of counseling/coordination of care: After reviewing the chart and assessing the patient at bedside, I spoke with patient in regards to liver biopsy, disposition, and prognosis.  Patient says he has had multiple conversations with Dr. Lowell Guitar and other attendings about his current health status.  We briefly reviewed liver biopsy results.  I attempted to speak with the patient in regards to mobility, participation with PT/OT, and plans to DC to a skilled nursing facility for rehab.  However, patient wished to speak about his past accomplishments instead.  Therapeutic silence and active listening provided for patient to  have the space to discussed his work with [employer].  His career is very important to him.  He uses this is a diversion from discussing his current health status.  I attempted to elicit goals important to the patient.  Patient states he just wants to rest since he can never get more than 30 minutes of sleep in a row. We discussed the importance of improving his functional status by participating in mobility exercises. Pt says he is ready and willing to participate, but wants to work with the therapist just once per day.   I assured patient that we will continue to follow him throughout his hospitalization.  He shared that we do not need to check in with him every day but we can if we wish.  I shared that we will continue to monitor his chart and engage with him in regards to the goals of his care on a regular basis.  Questions and concerns were addressed.  I counseled with patient's nurse who had no concerns.  Discussed that patient was encouraged to be more mobile  Code Status: DNR  Prognosis: Unable to determine  Discharge Planning: SNF  Care plan was discussed with patient, RN Samara Deist  Physical Exam Vitals reviewed.  HENT:     Head: Normocephalic.     Comments: Temporal wasting    Mouth/Throat:     Mouth: Mucous membranes are moist.  Eyes:     Pupils: Pupils are equal, round, and reactive to light.  Cardiovascular:     Rate and Rhythm: Normal  rate.     Pulses: Normal pulses.  Pulmonary:     Effort: Pulmonary effort is normal.  Abdominal:     Palpations: Abdomen is soft.  Musculoskeletal:     Comments: Generalized weakness  Skin:    General: Skin is warm.     Coloration: Skin is jaundiced.  Neurological:     Mental Status: He is alert and oriented to person, place, and time.  Psychiatric:        Mood and Affect: Mood normal.        Behavior: Behavior normal.        Thought Content: Thought content normal.        Judgment: Judgment normal.              Palliative Assessment/Data: 50%    Total Time 25 minutes  Greater than 50%  of this time was spent counseling and coordinating care related to the above assessment and plan.  Thank you for allowing the Palliative Medicine Team to assist in the care of this patient.  Samara Deist L. Manon Hilding, FNP-BC Palliative Medicine Team Team Phone # [phone]

## 2021-08-14 NOTE — Progress Notes (Signed)
Occupational Therapy Treatment Patient Details Name: Erik Chan MRN: [id number] DOB: [birth year] Today's Date: 08/14/2021   History of present illness Pt is a 66 y.o. male admitted 07/30/21 with jaundice, generalized weakness (reports not being able to get up since 07/25/21). 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 S/p RHC 6/22 with biventricular failure, concern for low output. PMH includes HF (EF 20%), NICM, COPD, DM2, fibromyalgia.   OT comments  Patient with incremental gains toward patient focused goals.  Patient plan of care discussed and goals downgraded as appropriate.  OT can continue to check on this patient later this week to determine if continued OT in the acute setting is appropriate.  Patient is now stating that an MD advised him he can defer acute rehab if he is not up to it, and, per the patient, he can just wait until post acute therapies work with him.  SNF level therapies will be needed post acute to attempt to maximize functional status if the patient agrees.        Recommendations for follow up therapy are one component of a multi-disciplinary discharge planning process, led by the attending physician.  Recommendations may be updated based on patient status, additional functional criteria and insurance authorization.    Follow Up Recommendations  Skilled nursing-short term rehab (<3 hours/day)    Assistance Recommended at Discharge Frequent or constant Supervision/Assistance  Patient can return home with the following  A lot of help with walking and/or transfers;A lot of help with bathing/dressing/bathroom;Assistance with cooking/housework;Direct supervision/assist for medications management;Direct supervision/assist for financial management;Assist for transportation;Help with stairs or ramp for entrance   Equipment Recommendations       Recommendations for Other Services      Precautions /  Restrictions Precautions Precautions: Fall Precaution Comments: watch sats and BP Restrictions Weight Bearing Restrictions: No       Mobility Bed Mobility Overal bed mobility: Needs Assistance Bed Mobility: Rolling Rolling: Supervision         General bed mobility comments: patient is now on air matress    Transfers                   General transfer comment: defers any attenpts     Balance                                           ADL either performed or assessed with clinical judgement   ADL   Eating/Feeding: Set up;Bed level   Grooming: Wash/dry hands;Wash/dry face;Set up;Bed level                                      Extremity/Trunk Assessment Upper Extremity Assessment Upper Extremity Assessment: Generalized weakness   Lower Extremity Assessment Lower Extremity Assessment: Defer to PT evaluation   Cervical / Trunk Assessment Cervical / Trunk Assessment: Kyphotic                      Cognition                                       General Comments: No changes from previous sessions.  Exercises Other Exercises Other Exercises: 2 sets and 5 reps of supine chest press Other Exercises: 2 sets and 5 reps of supine lateral raise Other Exercises: 2 sets and 5 reps of supine heel slides Other Exercises: 1 set and 10 reps of ankle pumps    Shoulder Instructions       General Comments  BP 90/54    Pertinent Vitals/ Pain       Pain Assessment Pain Assessment: Faces Faces Pain Scale: Hurts a little bit Pain Location: low back and bottom Pain Descriptors / Indicators: Aching, Tender Pain Intervention(s): Monitored during session                                                          Frequency  Min 2X/week        Progress Toward Goals  OT Goals(current goals can now be found in the care plan section)  Progress towards OT goals: Not progressing  toward goals - comment (patient continues slef limit, and declines most encouraged activities.)  Acute Rehab OT Goals Patient Stated Goal: I can always wait to rehab when I leave here OT Goal Formulation: With patient Time For Goal Achievement: 08/27/21 Potential to Achieve Goals: Fair ADL Goals Pt Will Perform Grooming: with set-up;sitting Pt Will Transfer to Toilet: with supervision;stand pivot transfer;bedside commode Pt Will Perform Toileting - Clothing Manipulation and hygiene: with supervision;sitting/lateral leans  Plan Discharge plan remains appropriate    Co-evaluation                 AM-PAC OT "6 Clicks" Daily Activity     Outcome Measure   Help from another person eating meals?: A Little Help from another person taking care of personal grooming?: A Little Help from another person toileting, which includes using toliet, bedpan, or urinal?: A Lot Help from another person bathing (including washing, rinsing, drying)?: A Lot Help from another person to put on and taking off regular upper body clothing?: A Lot Help from another person to put on and taking off regular lower body clothing?: A Lot 6 Click Score: 14    End of Session    OT Visit Diagnosis: Unsteadiness on feet (R26.81);Other abnormalities of gait and mobility (R26.89);Muscle weakness (generalized) (M62.81)   Activity Tolerance Other (comment) (Patient continues to self limit)   Patient Left in bed;with call bell/phone within reach   Nurse Communication Other (comment) (limited session)        Time: 1638-4665 OT Time Calculation (min): 15 min  Charges: OT General Charges $OT Visit: 1 Visit OT Treatments $Therapeutic Activity: 8-22 mins  08/14/2021  RP, OTR/L  Acute Rehabilitation Services  Office:  [phone]   Suzanna Obey 08/14/2021, 9:17 AM

## 2021-08-15 DIAGNOSIS — I5041 Acute combined systolic (congestive) and diastolic (congestive) heart failure: Secondary | ICD-10-CM | POA: Diagnosis not present

## 2021-08-15 DIAGNOSIS — B179 Acute viral hepatitis, unspecified: Secondary | ICD-10-CM | POA: Diagnosis not present

## 2021-08-15 DIAGNOSIS — R17 Unspecified jaundice: Secondary | ICD-10-CM

## 2021-08-15 LAB — CBC
HCT: 37.2 % — ABNORMAL LOW (ref 39.0–52.0)
Hemoglobin: 12.8 g/dL — ABNORMAL LOW (ref 13.0–17.0)
MCH: 30.8 pg (ref 26.0–34.0)
MCHC: 34.4 g/dL (ref 30.0–36.0)
MCV: 89.6 fL (ref 80.0–100.0)
Platelets: 124 10*3/uL — ABNORMAL LOW (ref 150–400)
RBC: 4.15 MIL/uL — ABNORMAL LOW (ref 4.22–5.81)
RDW: 21.3 % — ABNORMAL HIGH (ref 11.5–15.5)
WBC: 6.8 10*3/uL (ref 4.0–10.5)
nRBC: 0 % (ref 0.0–0.2)

## 2021-08-15 LAB — COMPREHENSIVE METABOLIC PANEL
ALT: 154 U/L — ABNORMAL HIGH (ref 0–44)
AST: 141 U/L — ABNORMAL HIGH (ref 15–41)
Albumin: 2.1 g/dL — ABNORMAL LOW (ref 3.5–5.0)
Alkaline Phosphatase: 177 U/L — ABNORMAL HIGH (ref 38–126)
Anion gap: 12 (ref 5–15)
BUN: 13 mg/dL (ref 8–23)
CO2: 36 mmol/L — ABNORMAL HIGH (ref 22–32)
Calcium: 8.3 mg/dL — ABNORMAL LOW (ref 8.9–10.3)
Chloride: 83 mmol/L — ABNORMAL LOW (ref 98–111)
Creatinine, Ser: 0.87 mg/dL (ref 0.61–1.24)
GFR, Estimated: >60 mL/min (ref >60)
Glucose, Bld: 75 mg/dL (ref 70–99)
Potassium: 3.3 mmol/L — ABNORMAL LOW (ref 3.5–5.1)
Sodium: 131 mmol/L — ABNORMAL LOW (ref 135–145)
Total Bilirubin: 18.2 mg/dL (ref 0.3–1.2)
Total Protein: 5.5 g/dL — ABNORMAL LOW (ref 6.5–8.1)

## 2021-08-15 LAB — COOXEMETRY PANEL
Carboxyhemoglobin: 3.5 % — ABNORMAL HIGH (ref 0.5–1.5)
Methemoglobin: <0.7 % (ref 0.0–1.5)
O2 Saturation: 61.5 %
Total hemoglobin: 13.3 g/dL (ref 12.0–16.0)

## 2021-08-15 LAB — GLUCOSE, CAPILLARY
Glucose-Capillary: 74 mg/dL (ref 70–99)
Glucose-Capillary: 98 mg/dL (ref 70–99)
Glucose-Capillary: 87 mg/dL (ref 70–99)
Glucose-Capillary: 88 mg/dL (ref 70–99)

## 2021-08-15 LAB — MRSA NEXT GEN BY PCR, NASAL: MRSA by PCR Next Gen: NOT DETECTED

## 2021-08-15 MED ORDER — POTASSIUM CHLORIDE CRYS ER 20 MEQ PO TBCR: 40.0000 meq | EXTENDED_RELEASE_TABLET | Freq: Every day | ORAL | Status: DC

## 2021-08-15 MED ORDER — POTASSIUM CHLORIDE CRYS ER 20 MEQ PO TBCR
40.0000 meq | EXTENDED_RELEASE_TABLET | ORAL | Status: AC
  Administered 2021-08-15 (×2): 40 meq via ORAL
  Filled 2021-08-15 (×2): qty 2

## 2021-08-15 MED ORDER — TORSEMIDE 20 MG PO TABS
20.0000 mg | ORAL_TABLET | ORAL | Status: DC
  Administered 2021-08-15: 20 mg via ORAL
  Filled 2021-08-15: qty 1

## 2021-08-15 NOTE — Progress Notes (Signed)
Pt calling out to the nurses desk repeatedly; adamant about getting back in the bed from chair and that he's "been there for over an hour". RN educated patient that per discussions this morning with Ghimire MD,  care team needs him to sit in the chair for longer, versus laying in bed. Pt was informed that mobility tech will be by this afternoon to help get him back into the bed with this RN. Pt begrudgingly compliant.

## 2021-08-15 NOTE — TOC Progression Note (Signed)
Transition of Care [facility]) - Progression Note    Patient Details  Name: Erik Chan MRN: [id number] Date of Birth: 06/22/1955  [facility] CM/SW Contact  Ivette Loyal, [place] Phone Number: 08/15/2021, 3:43 PM  Clinical Narrative:    CSW spoke with pt about SNF, pt is still agreeable to [facility]. CSW will start auth.        Expected Discharge Plan and Services                                                 Social Determinants of Health (SDOH) Interventions    Readmission Risk Interventions     No data to display

## 2021-08-15 NOTE — Progress Notes (Signed)
                                                     Palliative Care Progress Note   Patient Name: Erik Chan       Date: 08/15/2021 DOB: 10/29/1955  Age: 66 y.o. MRN#: [id number] Attending Physician: Maretta Bees, MD Primary Care Physician: [name], MD Admit Date: 07/30/2021  After reviewing the patient's chart, I attempted to assess the patient at bedside. Patient was working with PT and attempting to walk in the hall. He was asking to return to bed and be allowed to sleep. Not appropriate to continue GOC discussions today.   Pt continues to need encouragement for mobility. PMT will continue to monitor the patient throughout his hospitalization.   Thank you for allowing the Palliative Medicine Team to assist in the care of [facility].  NO CHARGE  Shey Yott L. Manon Hilding, FNP-BC Palliative Medicine Team Team Phone # [phone]

## 2021-08-15 NOTE — TOC Progression Note (Signed)
Transition of Care [facility]) - Progression Note    Patient Details  Name: Erik Chan MRN: [id number] Date of Birth: [birth year]  Transition of Care [facility]) CM/SW Contact  Mearl Latin, LCSW Phone Number: 08/15/2021, 5:28 PM  Clinical Narrative:    CSW submitted clinicals to [facility] for review for [facility], Ref# [id number].         Expected Discharge Plan and Services                                                 Social Determinants of Health (SDOH) Interventions    Readmission Risk Interventions     No data to display

## 2021-08-15 NOTE — Discharge Instructions (Signed)

## 2021-08-15 NOTE — Progress Notes (Signed)
Nutrition Follow-up  DOCUMENTATION CODES:   Severe malnutrition in context of chronic illness  INTERVENTION:   If aggressive nutrition intervention is desired, recommend given presence of malnutrition and minimal po intake. Discussed nutrition concerns with Care Team including Attending MD and Palliative Care  Recommend increasing bowel regimen given no documented BM since 6/30; pt is on lactulose  Continue liberalized diet  Continue Ensure Enlive po TID, each supplement provides 350 kcal and 20 grams of protein and encourage po intake.   Continue MVI with Minerals  NUTRITION DIAGNOSIS:   Severe Malnutrition related to chronic illness (CHF, COPD) as evidenced by severe muscle depletion, severe fat depletion.   GOAL:   Patient will meet greater than or equal to 90% of their needs   MONITOR:   PO intake, Supplement acceptance, Labs, Weight trends  REASON FOR ASSESSMENT:   Consult Assessment of nutrition requirement/status, Diet education  ASSESSMENT:   66 yo male admitted with severe acute hepatitis with jaundice, fatigue, loss of appetite in addition to hyponatremia, AKI. PMH includes CHF, COPD, DM. Pt reports hx of tobacco use, no EtOH use   Pt remains very weak; rehab at SNF recommended at discharge  Recorded po intake 0-10% of meals. Pt reports he is not eating meal trays because his family is bringing in a special diet that is low in sodium from home that is meeting his needs. This AM, pt only drank black coffee and nothing else. Noted unopened yogurt, magic cup and Ensure on bedside table. RN reports she offered to bring in ice for patient this morning so that he could chill his Ensure but pt declined, did not want. Pt reports he is drinking Ensure but RN does not feel this is the case. RN also indicates that pt is mostly consuming black coffee and bites of applesauce with med pass.   Pt with stage II pressure injury to sacrum  +constipation with no BM since 6/30.  Pt is on lactulose  Noted weight today 97.5 kg; weight from 7/4 102.5 kg but weight from 7/3 was 67.9 kg.  Weight Information (since admission)  Date/Time Weight Weight in lbs BSA (Calculated - sq m) BMI (Calculated) Who  08/15/21 0417 97.5 kg 215 lbs -- 27.59 JI  08/13/21 0318 102.5 kg 226 lbs -- 29 NW  08/12/21 0451 67.9 kg 149.69 lbs -- 19.21 NW  08/11/21 0619 71.5 kg 157.63 lbs -- 20.23 AD  08/10/21 0628 67.3 kg 148.37 lbs -- 19.04 AG  08/09/21 0619 68.6 kg 151.24 lbs -- 19.41 LS  08/08/21 0734 71.8 kg 158.29 lbs -- 20.31 GM  08/08/21 0517 69.2 kg 152.56 lbs -- 19.58 LS  08/07/21 0346 74.4 kg 164.02 lbs -- 21.05 NW  08/06/21 0515 74 kg 163.14 lbs -- 20.94 JW  08/05/21 0500 73.7 kg 162.48 lbs -- 20.85 JW  08/04/21 0530 76.6 kg 168.87 lbs -- 21.67 SS  08/03/21 0447 74.6 kg 164.46 lbs -- 21.11 AG  08/02/21 0417 80.4 kg 177.25 lbs -- 22.75 AG  08/01/21 0500 75.3 kg 166.01 lbs -- 21.3 SH  07/30/21 1650 74.4 kg       Labs: sodium 131 (L), potassium 3.3 (L) Meds: lactulose, MVI with Minerals, Kcl   Diet Order:   Diet Order             Diet regular Room service appropriate? Yes; Fluid consistency: Thin; Fluid restriction: 1200 mL Fluid  Diet effective now  EDUCATION NEEDS:   Education needs have been addressed  Skin:  Skin Assessment: Skin Integrity Issues: Skin Integrity Issues:: Stage II Stage II: sacrum  Last BM:  08/03/21  Height:   Ht Readings from Last 1 Encounters:  07/30/21 6\' 2"  (1.88 m)    Weight:   Wt Readings from Last 1 Encounters:  08/15/21 97.5 kg    Ideal Body Weight:     BMI:  Body mass index is 27.6 kg/m.  Estimated Nutritional Needs:   Kcal:  2300-2500 kcals  Protein:  120-140 g  Fluid:  Fluid restriction per MD    10/16/21 MS, RDN, LDN, CNSC Registered Dietitian III Clinical Nutrition RD Pager and On-Call Pager Number Located in [place]

## 2021-08-15 NOTE — Progress Notes (Addendum)
Patient ID: Erik Chan, male   DOB: [birth year], 66 y.o.   MRN: [id number]     Advanced Heart Failure Rounding Note  PCP-Cardiologist: Nicki Guadalajara, MD  HF: Dr. [name]  Subjective:    06/26: Milrinone cut back to 0.125 mcg. 06/29: Lasix gtt stopped >>> PO Torsemide. Liver biopsy. 07/03: Milrinone stopped  CO-OX 62% off milrinone.   CVP 9-10. Off diuretics.   LFTs and tbili continue to trend down.   SBP up to 90s after increasing midodrine to 15 mg TID  Remains in AF with controlled rate.   No dyspnea, orthopnea or PND. Reports new bed is uncomfortable.   RHC  6/22  Hepatic wedge = 26 RA = 22 RV = 57/24  PA = 55/29 (38) PCW = 26 (v = 34) Fick cardiac output/index = 3.4/1.8 Therm CO/CI = 2.1/1.0 PVR = 3.2 WU FA sat = 97% PA sat = 59%, 59%  PAPi = 1.2  Assessment: 1. Severe biventricular failure with low output 2. 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    Objective:   Weight Range: 97.5 kg Body mass index is 27.6 kg/m.   Vital Signs:   Temp:  [97.6 F (36.4 C)-98.5 F (36.9 C)] 98 F (36.7 C) (07/06 0417) Pulse Rate:  [62-76] 76 (07/06 0417) Resp:  [13-19] 19 (07/06 0417) BP: (84-102)/(46-63) 96/54 (07/06 0417) SpO2:  [90 %-95 %] 90 % (07/06 0417) Weight:  [97.5 kg] 97.5 kg (07/06 0417) Last BM Date : 08/09/21  Weight change: Filed Weights   08/12/21 0451 08/13/21 0318 08/15/21 0417  Weight: 67.9 kg 102.5 kg 97.5 kg    Intake/Output:   Intake/Output Summary (Last 24 hours) at 08/15/2021 0733 Last data filed at 08/15/2021 0420 Gross per 24 hour  Intake 557 ml  Output 375 ml  Net 182 ml      Physical Exam   General: Thin, chronically ill appearing. HEENT: normal Neck: supple. Jvp 8 -10 cm. Carotids 2+ bilat; no bruits.  Cor: PMI nondisplaced. Irregular rate & rhythm. No rubs, gallops or murmurs. Lungs: clear Abdomen: soft, nontender, nondistended.  Extremities: no cyanosis, clubbing,  rash, edema Neuro: alert & orientedx3, cranial nerves grossly intact. moves all 4 extremities w/o difficulty. Affect flat    Telemetry   AF 60s-70s (personally reviewed)   Labs    CBC Recent Labs    08/13/21 0335  WBC 7.4  NEUTROABS 5.8  HGB 12.8*  HCT 36.3*  MCV 89.2  PLT 102*   Basic Metabolic Panel Recent Labs    72/53/66 0335 08/14/21 0248 08/15/21 0434  NA 126* 132* 131*  K 3.4* 3.2* 3.3*  CL 82* 82* 83*  CO2 31 35* 36*  GLUCOSE 98 103* 75  BUN 13 13 13   CREATININE 0.79 0.92 0.87  CALCIUM 8.2* 8.2* 8.3*  MG 2.2  --   --   PHOS 3.6  --   --    Liver Function Tests Recent Labs    08/14/21 0248 08/15/21 0434  AST 150* 141*  ALT 163* 154*  ALKPHOS 214* 177*  BILITOT 20.9* 18.2*  PROT 5.9* 5.5*  ALBUMIN 2.2* 2.1*   No results for input(s): "LIPASE", "AMYLASE" in the last 72 hours.  Cardiac Enzymes No results for input(s): "CKTOTAL", "CKMB", "CKMBINDEX", "TROPONINI" in the last 72 hours.  BNP: BNP (last 3 results) Recent Labs    02/01/21 1522 02/13/21 1709  BNP 2,411.9* 1,831.1*    ProBNP (last 3 results) No results for  input(s): "PROBNP" in the last 8760 hours.   D-Dimer No results for input(s): "DDIMER" in the last 72 hours. Hemoglobin A1C No results for input(s): "HGBA1C" in the last 72 hours. Fasting Lipid Panel No results for input(s): "CHOL", "HDL", "LDLCALC", "TRIG", "CHOLHDL", "LDLDIRECT" in the last 72 hours. Thyroid Function Tests No results for input(s): "TSH", "T4TOTAL", "T3FREE", "THYROIDAB" in the last 72 hours.  Invalid input(s): "FREET3"  Other results:   Imaging    No results found.   Medications:     Scheduled Medications:  apixaban  5 mg Oral BID   Chlorhexidine Gluconate Cloth  6 each Topical Daily   digoxin  0.125 mg Oral Daily   feeding supplement  237 mL Oral TID BM   Gerhardt's butt cream   Topical BID   lactulose  20 g Oral BID   midodrine  15 mg Oral TID WC   multivitamin with minerals  1  tablet Oral Daily   [START ON 08/16/2021] potassium chloride  40 mEq Oral Daily   potassium chloride  40 mEq Oral Q4H   sodium chloride flush  3 mL Intravenous Q12H   spironolactone  12.5 mg Oral Daily   ursodiol  300 mg Oral TID    Infusions:    PRN Medications: acetaminophen, albuterol, guaiFENesin, hydrALAZINE, iohexol, ipratropium-albuterol, morphine injection, ondansetron (ZOFRAN) IV, mouth rinse, senna-docusate, traZODone    Patient Profile    66 y.o. male with history of chronic biventricular HF/NICM, PVCs/NSVT, COPD, hx pleural effusion s/p right thoracentesis 01/23, DM II. Admitted with progressive weakness in setting of elevated LFTs, hyponatremia, AKI and concern for a/c biventricular HF with low-output   Assessment/Plan   1. Chronic Biventricular HFrEF/ NICM - Echo (1/23): severely reduced EF < 20% and severely reduced RV. Moderate to severe MR.  - L/RHC (1/23): moderate non-obstructive CAD. EF < 10% - cMRI (1/23): LVEF 10%, severe biventricular heart failure, basal septal wall mid-wall LGE, apical inferior subendocardial LGE, and RV insertion non specific scar pattern.  - CT chest (1/23): without evidence for pulmonary sarcoidosis.   - Myeloma panel and SPEP/UPEP negative. ACE level OK (37). - Etiology for CM => isolated cardiac sarcoidosis vs prior myocarditis vs genetic cardiomyopathy, +/- PVCs - Cardiac PET previously set up at [facility], has not completed. Genetic testing sent 03/04/21. - Echo 05/30/21 EF 20-25% RV severely reduced.  - Referred to EP for ICD in April - has not scheduled appointment.  - RHC 08/01/21 with low output and elevated filling pressures. - Co-ox 62% off milrinone - CVP 9-10. Will resume 20 mg Torsemide every other day.  - SBP 90s. Continue midodrine 15 mg TID.  - Continue digoxin 0.125 - Continue spiro 12.5 mg daily - HF optimized. No diagnostic features of congestive hepatopathy on liver biopsy. 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   2. Atrial fibrillation - New, noted on tele this admit.  - Rate controlled - Limited rhythm control options. No amiodarone due to liver injury - Added Eliquis 5 BID on 07/05. Follow CBC. - No change  3. Acute liver injury - US abdomen R pleural effusion, + ascites, increased echogenicity of liver, large gallstone with gallbladder sludge. - CT abdomen pelvis - bilateral pleural effusions, small ascites, anasarca, + gallstone - MRCP- no focal liver abnormality, cholelithiasis without biliary dilatation, moderate to large b/l pleural effusions, mesenteric edema with ascites, diffuse body wall edema - [facility] following. S/p liver biopsy 06/29 => cholestatic hepatitis with moderate-severe cholestasis, minimal hepatitis.  Felt to be likely drug induced liver injury. Discussed with GI. Offending agent not certain, cannot rule out amiodarone or mexiletine. It is recommended to avoid these medications in the future. - Total Bili 24.6>24.9>25.1>26.2>26.1>29>30>27>24>23>22>21>18   4. Hyponatremia - Hypervolemic hyponatremia - Na 120>121>122 >123 >121 >122>126>127>126>125>126>132>131.  - Keep po fluid restricted.   5. AKI: - Scr baseline ~ 1, up to 1.6 this admit.  - Now back to baseline.  - Follow renal function with diuretics - Midodrine to support BP   6. PVCs/NSVT - >20% PVC burden during admit 01/23 - Rare PVCs on tele - Will avoid adding back amiodarone and mexiletine d/t liver injury - No OSA on sleep study 2/23.   7. COPD - Former heavy smoker. Quit smoking 01/07/21.   8. H/o Pleural Effusion - s/p rt thoracentesis 1/23 w/ 1200 cc fluid removal. - moderate to large b/l pleural effusions on MR this admit  9. Hypokalemia - K 3.3 - Supp   10. GOC - Palliative Care on board. Appreciate assistance. - Now DNR   Very weak. SNF for rehab recommended at discharge. Encouraged him to continue to participate in mobility. Requiring significant  encouragement from staff.   Length of Stay: 16  FINCH, LINDSAY N, PA-C  08/15/2021, 7:33 AM  Advanced Heart Failure Team Pager [phone] (M-F; 7a - 5p)  Please contact [facility] for night-coverage after hours (5p -7a ) and weekends on [email or web address]  Patient seen with PA, agree with the above note.   Stable today, co-ox 61.5%, CVP around 9. BP stable on midodrine.  He has been restarted on apixaban with gradual improvement in liver function.    Weak, now working with PT.   General: NAD, thin.  Neck: JVP 8-9 cm, no thyromegaly or thyroid nodule.  Lungs: Clear to auscultation bilaterally with normal respiratory effort. CV: Nondisplaced PMI.  Heart irregular S1/S2, no S3/S4, no murmur.  No peripheral edema.   Abdomen: Soft, nontender, no hepatosplenomegaly, no distention.  Skin: Intact without lesions or rashes.  Neurologic: Alert and oriented x 3.  Psych: Normal affect. Extremities: No clubbing or cyanosis.  HEENT: Normal.   With INR 1.4 yesterday and improved platelets, we have restarted apixaban for chronic AF.   LFTs gradually improving, suspect drug-induced lung injury with cholestasis, possibly due to amiodarone.  Would not use amiodarone in the future.   BP stable on current midodrine, continue.   Mild volume overload with stable creatinine, will start torsemide 20 mg every other day.   He is stable from cardiac perspective but very weak.  He will need SNF.  Cardiology to sign off, please call with future questions.   Marca Ancona 08/15/2021 9:21 AM

## 2021-08-15 NOTE — Plan of Care (Signed)
  Problem: Education: Goal: Ability to describe self-care measures that may prevent or decrease complications (Diabetes Survival Skills Education) will improve Outcome: Not Progressing   Problem: Coping: Goal: Ability to adjust to condition or change in health will improve Outcome: Not Progressing   Problem: Fluid Volume: Goal: Ability to maintain a balanced intake and output will improve Outcome: Not Progressing   Problem: Health Behavior/Discharge Planning: Goal: Ability to identify and utilize available resources and services will improve Outcome: Not Progressing Goal: Ability to manage health-related needs will improve Outcome: Not Progressing   Problem: Nutritional: Goal: Maintenance of adequate nutrition will improve Outcome: Not Progressing Goal: Progress toward achieving an optimal weight will improve Outcome: Not Progressing   Problem: Skin Integrity: Goal: Risk for impaired skin integrity will decrease Outcome: Not Progressing   Problem: Tissue Perfusion: Goal: Adequacy of tissue perfusion will improve Outcome: Not Progressing   Problem: Education: Goal: Knowledge of General Education information will improve Description: Including pain rating scale, medication(s)/side effects and non-pharmacologic comfort measures Outcome: Not Progressing   Problem: Health Behavior/Discharge Planning: Goal: Ability to manage health-related needs will improve Outcome: Not Progressing   Problem: Clinical Measurements: Goal: Ability to maintain clinical measurements within normal limits will improve Outcome: Not Progressing Goal: Will remain free from infection Outcome: Not Progressing   Problem: Activity: Goal: Risk for activity intolerance will decrease Outcome: Not Progressing   Problem: Nutrition: Goal: Adequate nutrition will be maintained Outcome: Not Progressing   Problem: Coping: Goal: Level of anxiety will decrease Outcome: Not Progressing   Problem: Pain  Managment: Goal: General experience of comfort will improve Outcome: Not Progressing   Problem: Skin Integrity: Goal: Risk for impaired skin integrity will decrease Outcome: Not Progressing   Problem: Education: Goal: Understanding of CV disease, CV risk reduction, and recovery process will improve Outcome: Not Progressing   Problem: Activity: Goal: Ability to return to baseline activity level will improve Outcome: Not Progressing   Problem: Cardiovascular: Goal: Ability to achieve and maintain adequate cardiovascular perfusion will improve Outcome: Not Progressing   Problem: Health Behavior/Discharge Planning: Goal: Ability to safely manage health-related needs after discharge will improve Outcome: Not Progressing   Problem: Metabolic: Goal: Ability to maintain appropriate glucose levels will improve Outcome: Progressing   Problem: Clinical Measurements: Goal: Diagnostic test results will improve Outcome: Progressing   Problem: Safety: Goal: Ability to remain free from injury will improve Outcome: Progressing   Problem: Cardiovascular: Goal: Vascular access site(s) Level 0-1 will be maintained Outcome: Progressing

## 2021-08-15 NOTE — Progress Notes (Signed)
Mobility Specialist Progress Note    08/15/21 1326  Mobility  Activity Transferred from chair to bed  Level of Assistance Minimal assist, patient does 75% or more  Assistive Device Front wheel walker  Distance Ambulated (ft) 2 ft  Activity Response Tolerated well  $Mobility charge 1 Mobility   Pt received c/o fatigue. Pt stated he felt like he would have a heart attack from not getting any rest because he was left in the chair. Returned to bed with call bell in reach and NT present.   Golinda Nation Mobility Specialist

## 2021-08-15 NOTE — Progress Notes (Signed)
Physical Therapy Treatment Patient Details Name: Erik Chan MRN: [id number] DOB: Jul 03, 1955 Today's Date: 08/15/2021   History of Present Illness Pt is a 66 y.o. male admitted 07/30/21 with jaundice, generalized weakness (reports not being able to get up since 07/25/21). 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 S/p RHC 6/22 with biventricular failure, concern for low output. PMH includes HF (EF 20%), NICM, COPD, DM2, fibromyalgia.   PT Comments    Pt slowly progressing with mobility; continues to requiring max encouragement for OOB mobility and increasing activity. Today, pt tolerated walking 5' + 34' with RW, requiring min guard to modA. Pt remains limited by generalized weakness, decreased activity tolerance, poor balance strategies/postural reactions and impaired cognition. Continue to recommend SNF-level therapies to maximize functional mobility and independence prior to return home.    Recommendations for follow up therapy are one component of a multi-disciplinary discharge planning process, led by the attending physician.  Recommendations may be updated based on patient status, additional functional criteria and insurance authorization.  Follow Up Recommendations  Skilled nursing-short term rehab (<3 hours/day) Can patient physically be transported by private vehicle: Yes   Assistance Recommended at Discharge Intermittent Supervision/Assistance  Patient can return home with the following A lot of help with walking and/or transfers;A lot of help with bathing/dressing/bathroom;Assistance with cooking/housework;Help with stairs or ramp for entrance;Assist for transportation   Equipment Recommendations  Hospital bed;Wheelchair cushion (measurements PT);Wheelchair (measurements PT)    Recommendations for Other Services       Precautions / Restrictions Precautions Precautions: Fall Restrictions Weight  Bearing Restrictions: No     Mobility  Bed Mobility Overal bed mobility: Needs Assistance Bed Mobility: Supine to Sit     Supine to sit: Supervision, HOB elevated     General bed mobility comments: use of bed rail, supervision for safety/lines    Transfers Overall transfer level: Needs assistance Equipment used: Rolling walker (2 wheels) Transfers: Sit to/from Stand Sit to Stand: Mod assist           General transfer comment: cues for hand placement, modA for trunk elevation standing from EOB and recliner to RW; increased time and effort to transition BUE support to RW and achieve fully upright posture    Ambulation/Gait Ambulation/Gait assistance: Min guard, Min assist Gait Distance (Feet): 42 Feet (+ 34') Assistive device: Rolling walker (2 wheels) Gait Pattern/deviations: Step-through pattern, Decreased stride length, Trunk flexed, Shuffle Gait velocity: Decreased     General Gait Details: slow, mostly steady but fatigued gait with RW and min guard for balance; pt frequently requesting seated rest break requiring max encouragement to increase ambulation distance until pt refusing to walk further requiring sitting rest in recliner   Stairs             Wheelchair Mobility    Modified Rankin (Stroke Patients Only)       Balance Overall balance assessment: Needs assistance Sitting-balance support: No upper extremity supported, Feet supported Sitting balance-Leahy Scale: Fair     Standing balance support: Bilateral upper extremity supported, Reliant on assistive device for balance Standing balance-Leahy Scale: Poor                              Cognition Arousal/Alertness: Awake/alert Behavior During Therapy: Flat affect Overall Cognitive Status: No family/caregiver present to determine baseline cognitive functioning Area of Impairment: Attention, Memory, Following commands, Safety/judgement, Awareness, Problem solving  Current Attention Level: Sustained Memory: Decreased short-term memory Following Commands: Follows one step commands with increased time Safety/Judgement: Decreased awareness of safety, Decreased awareness of deficits Awareness: Emergent Problem Solving: Decreased initiation, Requires verbal cues General Comments: self-limiting, attempts to deflect mobility with conversation though pt reports "I'm not trying to be argumentative"; but ultimately participating more this session        Exercises      General Comments General comments (skin integrity, edema, etc.): continues to require max encouragement and difficult to reason with regarding importance of mobility progression; multiple excuses related to current condition      Pertinent Vitals/Pain Pain Assessment Pain Assessment: Faces Faces Pain Scale: Hurts a little bit Pain Location: generalized Pain Descriptors / Indicators: Tiring Pain Intervention(s): Monitored during session, Limited activity within patient's tolerance    Home Living                          Prior Function            PT Goals (current goals can now be found in the care plan section) Progress towards PT goals: Progressing toward goals    Frequency    Min 3X/week      PT Plan Current plan remains appropriate    Co-evaluation              AM-PAC PT "6 Clicks" Mobility   Outcome Measure  Help needed turning from your back to your side while in a flat bed without using bedrails?: A Little Help needed moving from lying on your back to sitting on the side of a flat bed without using bedrails?: A Little Help needed moving to and from a bed to a chair (including a wheelchair)?: A Lot Help needed standing up from a chair using your arms (e.g., wheelchair or bedside chair)?: A Lot Help needed to walk in hospital room?: A Little Help needed climbing 3-5 steps with a railing? : Total 6 Click Score: 14    End of Session  Equipment Utilized During Treatment: Gait belt Activity Tolerance: Patient tolerated treatment well;Patient limited by fatigue Patient left: in chair;with call bell/phone within reach Nurse Communication: Mobility status PT Visit Diagnosis: Unsteadiness on feet (R26.81);Muscle weakness (generalized) (M62.81);Difficulty in walking, not elsewhere classified (R26.2)     Time: 6789-3810 PT Time Calculation (min) (ACUTE ONLY): 33 min  Charges:  $Gait Training: 8-22 mins $Therapeutic Activity: 8-22 mins                     Ina Homes, PT, DPT Acute Rehabilitation Services  Personal: Secure Chat Rehab Office: [phone]  Malachy Chamber 08/15/2021, 11:59 AM

## 2021-08-15 NOTE — Progress Notes (Addendum)
PROGRESS NOTE        PATIENT DETAILS Name: Erik Chan Age: 66 y.o. Sex: male Date of Birth: [birth year] Admit Date: 07/30/2021 Admitting Physician Briscoe Deutscher, MD SEG:BTDVVO, Misty Stanley, MD  Brief Summary: Patient is a 66 y.o.  male history of chronic HFpEF/biventricular heart failure-who presented with painless jaundice-found to have drug-induced liver injury on a liver biopsy.  See below for further details.  Significant studies: 6/20>> Tylenol level:<10 6/20>> acute hepatitis serology: Negative 6/20>> RUQ ultrasound: Large gallstone/gallbladder sludge/increased echogenicity of liver parenchyma 6/20>> CT abdomen/pelvis: Bilateral pleural effusion/small ascites/anasarca/cholelithiasis 6/21>> MRCP: No findings to suggest choledocholithiasis. 6/21>> ceruloplasmin: 32.3 (mildly elevated) 6/21>> alpha-1 antitrypsin level: Normal limits 6/21>> anti-smooth muscle antibody: Negative 6/21>> ANA: Negative 6/21>> EBV DNA PCR: Negative 6/23>> HSV DNA PCR: Negative 6/26>> hemochromatosis DNA-PCR: Not detected 6/26>> antimitochondrial antibody: Negative  Procedures: 6/22>> left IJ 6/29>> liver biopsy 7/1>> IJ removed-PICC placed  Consults: GI, cardiology, nephrology, palliative care,IR  Subjective: Feels weak-no major events overnight.  Objective: Vitals: Blood pressure 100/62, pulse 67, temperature 98 F (36.7 C), temperature source Oral, resp. rate 20, height 6\' 2"  (1.88 m), weight 97.5 kg, SpO2 93 %.   Exam: Gen Exam:Alert awake-not in any distress.  Grossly icteric. HEENT:atraumatic, normocephalic Chest: B/L clear to auscultation anteriorly CVS:S1S2 regular Abdomen:soft non tender, non distended Extremities:no edema Neurology: Non focal Skin: no rash   Pertinent Labs/Radiology:    Latest Ref Rng & Units 08/15/2021    8:15 AM 08/13/2021    3:35 AM 08/12/2021    3:55 AM  CBC  WBC 4.0 - 10.5 K/uL 6.8  7.4  8.1   Hemoglobin 13.0 - 17.0 g/dL 10/13/2021   16.0  73.7   Hematocrit 39.0 - 52.0 % 37.2  36.3  34.7   Platelets 150 - 400 K/uL 124  102  105     Lab Results  Component Value Date   NA 131 (L) 08/15/2021   K 3.3 (L) 08/15/2021   CL 83 (L) 08/15/2021   CO2 36 (H) 08/15/2021       Assessment/Plan: 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  GI recommending ursodiol 3 times daily-we will continue to follow LFTs/coags periodically.  This will likely take several weeks for further improvement.  Acute on chronic combined HFrEF/HFpEF/biventricular heart failure: Advanced heart failure team following-no longer on milrinone/IV diuretics-BP soft but stable-volume status reasonable-remains on digoxin/spironolactone/midodrine.  Persistent atrial fibrillation: Rate controlled-not a candidate for anticoagulation with severe liver injury.  BP soft-Limited options with rate control agents.  Not a candidate for amiodarone due to ongoing hepatitis.  Hyponatremia: Due to CHF-mild-on diuretics-follow.  Hypokalemia: Repleted.  AKI: Hemodynamically mediated-cardiorenal/hepatorenal syndrome-improved-remains on midodrine-no longer on octreotide.  COPD: Continue bronchodilators-not in exacerbation.  Bilateral pleural effusion: Likely related to CHF-given stability-no indication for paracentesis.  DM-2 current (A1c 6.7 on 01/23): Continue to monitor CBGs-SSI and hold due to hypoglycemic episode.  Recent Labs    08/14/21 1530 08/14/21 2150 08/15/21 0622  GLUCAP 84 79 74     Nutrition Status: Nutrition Problem: Severe Malnutrition Etiology: chronic illness (CHF, COPD) Signs/Symptoms: severe muscle depletion, severe fat depletion Interventions: Liberalize Diet, Ensure Enlive (each supplement provides 350kcal and 20 grams of protein), MVI  Pressure Ulcer: Pressure Injury 07/31/21 Sacrum Stage 2  -  Partial thickness loss of dermis  presenting as a shallow open injury with a red, pink wound bed without slough. (Active)  07/31/21 1830  Location: Sacrum  Location Orientation:   Staging: Stage 2 -  Partial thickness loss of dermis presenting as a shallow open injury with a red, pink wound bed without slough.  Wound Description (Comments):   Present on Admission: Yes  Dressing Type Foam - Lift dressing to assess site every shift 08/14/21 1955   Obesity: Estimated body mass index is 27.6 kg/m as calculated from the following:   Height as of this encounter: 6\' 2"  (1.88 m).   Weight as of this encounter: 97.5 kg.   Code status:   Code Status: DNR   DVT Prophylaxis: SCD's Start: 08/02/21 0832 SCDs Start: 07/30/21 2025 apixaban (ELIQUIS) tablet 5 mg    Family Communication: Talked-with spouse-Patricia-Home [phone], cell [phone] updated over the phone on 7/6.   Disposition Plan: Status is: Inpatient Remains inpatient appropriate because: Severe cholestatic hepatitis-jaundice-not yet stable for discharge.   Planned Discharge Destination: SNF   Diet: Diet Order             Diet regular Room service appropriate? Yes; Fluid consistency: Thin; Fluid restriction: 1200 mL Fluid  Diet effective now                     Antimicrobial agents: Anti-infectives (From admission, onward)    None        MEDICATIONS: Scheduled Meds:  apixaban  5 mg Oral BID   Chlorhexidine Gluconate Cloth  6 each Topical Daily   digoxin  0.125 mg Oral Daily   feeding supplement  237 mL Oral TID BM   Gerhardt's butt cream   Topical BID   lactulose  20 g Oral BID   midodrine  15 mg Oral TID WC   multivitamin with minerals  1 tablet Oral Daily   [START ON 08/16/2021] potassium chloride  40 mEq Oral Daily   potassium chloride  40 mEq Oral Q4H   sodium chloride flush  3 mL Intravenous Q12H   spironolactone  12.5 mg Oral Daily   torsemide  20 mg Oral QODAY   ursodiol  300 mg Oral  TID   Continuous Infusions: PRN Meds:.acetaminophen, albuterol, guaiFENesin, hydrALAZINE, iohexol, ipratropium-albuterol, morphine injection, ondansetron (ZOFRAN) IV, mouth rinse, senna-docusate, traZODone   I have personally reviewed following labs and imaging studies  LABORATORY DATA: CBC: Recent Labs  Lab 08/09/21 0415 08/10/21 0444 08/11/21 0559 08/12/21 0355 08/13/21 0335 08/15/21 0815  WBC 9.5 8.3 7.9 8.1 7.4 6.8  NEUTROABS 7.8* 6.7 6.4 6.4 5.8  --   HGB 11.6* 11.6* 11.9* 12.7* 12.8* 12.8*  HCT 33.1* 33.0* 34.7* 34.7* 36.3* 37.2*  MCV 89.2 87.8 89.2 87.6 89.2 89.6  PLT 100* 98* 101* 105* 102* 124*     Basic Metabolic Panel: Recent Labs  Lab 08/09/21 0415 08/10/21 0444 08/11/21 0559 08/12/21 0355 08/13/21 0335 08/14/21 0248 08/15/21 0434  NA 127* 126* 126* 125* 126* 132* 131*  K 3.4* 3.0* 3.1* 3.6 3.4* 3.2* 3.3*  CL 82* 81* 81* 82* 82* 82* 83*  CO2 35* 34* 34* 32 31 35* 36*  GLUCOSE 103* 74 104* 99 98 103* 75  BUN 21 20 15 13 13 13 13   CREATININE 1.04 0.97 0.86 0.78 0.79 0.92 0.87  CALCIUM 8.0* 7.9* 8.1* 8.0* 8.2* 8.2* 8.3*  MG 2.2 2.1 2.3 2.2 2.2  --   --   PHOS 3.9 4.3 4.2 3.7 3.6  --   --  GFR: Estimated Creatinine Clearance: 97.1 mL/min (by C-G formula based on SCr of 0.87 mg/dL).  Liver Function Tests: Recent Labs  Lab 08/11/21 0559 08/12/21 0355 08/13/21 0335 08/14/21 0248 08/15/21 0434  AST 171* 173* 173* 150* 141*  ALT 161* 168* 170* 163* 154*  ALKPHOS 192* 226* 224* 214* 177*  BILITOT 23.4* 23.3* 22.4* 20.9* 18.2*  PROT 5.4* 5.7* 5.8* 5.9* 5.5*  ALBUMIN 2.1* 2.1* 2.2* 2.2* 2.1*    No results for input(s): "LIPASE", "AMYLASE" in the last 168 hours. No results for input(s): "AMMONIA" in the last 168 hours.   Coagulation Profile: Recent Labs  Lab 08/10/21 0444 08/14/21 1002  INR 1.4* 1.4*     Cardiac Enzymes: No results for input(s): "CKTOTAL", "CKMB", "CKMBINDEX", "TROPONINI" in the last 168 hours.  BNP (last 3  results) No results for input(s): "PROBNP" in the last 8760 hours.  Lipid Profile: No results for input(s): "CHOL", "HDL", "LDLCALC", "TRIG", "CHOLHDL", "LDLDIRECT" in the last 72 hours.  Thyroid Function Tests: No results for input(s): "TSH", "T4TOTAL", "FREET4", "T3FREE", "THYROIDAB" in the last 72 hours.  Anemia Panel: No results for input(s): "VITAMINB12", "FOLATE", "FERRITIN", "TIBC", "IRON", "RETICCTPCT" in the last 72 hours.  Urine analysis:    Component Value Date/Time   COLORURINE AMBER (A) 07/30/2021 2119   APPEARANCEUR HAZY (A) 07/30/2021 2119   LABSPEC 1.020 07/30/2021 2119   PHURINE 5.5 07/30/2021 2119   GLUCOSEU >=500 (A) 07/30/2021 2119   HGBUR NEGATIVE 07/30/2021 2119   BILIRUBINUR LARGE (A) 07/30/2021 2119   KETONESUR NEGATIVE 07/30/2021 2119   PROTEINUR 30 (A) 07/30/2021 2119   NITRITE NEGATIVE 07/30/2021 2119   LEUKOCYTESUR NEGATIVE 07/30/2021 2119    Sepsis Labs: Lactic Acid, Venous No results found for: "LATICACIDVEN"  MICROBIOLOGY: Recent Results (from the past 240 hour(s))  MRSA Next Gen by PCR, Nasal     Status: None   Collection Time: 08/15/21  4:34 AM   Specimen: Nasal Mucosa; Nasal Swab  Result Value Ref Range Status   MRSA by PCR Next Gen NOT DETECTED NOT DETECTED Final    Comment: (NOTE) The GeneXpert MRSA Assay (FDA approved for NASAL specimens only), is one component of a comprehensive MRSA colonization surveillance program. It is not intended to diagnose MRSA infection nor to guide or monitor treatment for MRSA infections. Test performance is not FDA approved in patients less than 75 years old. Performed at [facility], [place] [address], [place], [place] [address]     RADIOLOGY STUDIES/RESULTS: No results found.   LOS: 16 days   Oren Binet, MD  [facility]    To contact the attending provider between 7A-7P or the covering provider during after hours 7P-7A, please log into the web site [email or web address] and access  using universal [place] password for that web site. If you do not have the password, please call the hospital operator.  08/15/2021, 11:53 AM

## 2021-08-16 DIAGNOSIS — N179 Acute kidney failure, unspecified: Secondary | ICD-10-CM | POA: Diagnosis not present

## 2021-08-16 DIAGNOSIS — B179 Acute viral hepatitis, unspecified: Secondary | ICD-10-CM | POA: Diagnosis not present

## 2021-08-16 DIAGNOSIS — I5041 Acute combined systolic (congestive) and diastolic (congestive) heart failure: Secondary | ICD-10-CM | POA: Diagnosis not present

## 2021-08-16 DIAGNOSIS — E871 Hypo-osmolality and hyponatremia: Secondary | ICD-10-CM | POA: Diagnosis not present

## 2021-08-16 LAB — COMPREHENSIVE METABOLIC PANEL
ALT: 149 U/L — ABNORMAL HIGH (ref 0–44)
AST: 138 U/L — ABNORMAL HIGH (ref 15–41)
Albumin: 2.2 g/dL — ABNORMAL LOW (ref 3.5–5.0)
Alkaline Phosphatase: 172 U/L — ABNORMAL HIGH (ref 38–126)
Anion gap: 13 (ref 5–15)
BUN: 12 mg/dL (ref 8–23)
CO2: 37 mmol/L — ABNORMAL HIGH (ref 22–32)
Calcium: 8.2 mg/dL — ABNORMAL LOW (ref 8.9–10.3)
Chloride: 82 mmol/L — ABNORMAL LOW (ref 98–111)
Creatinine, Ser: 0.95 mg/dL (ref 0.61–1.24)
GFR, Estimated: >60 mL/min (ref >60)
Glucose, Bld: 78 mg/dL (ref 70–99)
Potassium: 3 mmol/L — ABNORMAL LOW (ref 3.5–5.1)
Sodium: 132 mmol/L — ABNORMAL LOW (ref 135–145)
Total Bilirubin: 17.6 mg/dL — ABNORMAL HIGH (ref 0.3–1.2)
Total Protein: 5.9 g/dL — ABNORMAL LOW (ref 6.5–8.1)

## 2021-08-16 LAB — CBC
HCT: 38.5 % — ABNORMAL LOW (ref 39.0–52.0)
Hemoglobin: 13.4 g/dL (ref 13.0–17.0)
MCH: 31.2 pg (ref 26.0–34.0)
MCHC: 34.8 g/dL (ref 30.0–36.0)
MCV: 89.7 fL (ref 80.0–100.0)
Platelets: 141 10*3/uL — ABNORMAL LOW (ref 150–400)
RBC: 4.29 MIL/uL (ref 4.22–5.81)
RDW: 21.8 % — ABNORMAL HIGH (ref 11.5–15.5)
WBC: 6.9 10*3/uL (ref 4.0–10.5)
nRBC: 0 % (ref 0.0–0.2)

## 2021-08-16 LAB — COOXEMETRY PANEL
Carboxyhemoglobin: 2.9 % — ABNORMAL HIGH (ref 0.5–1.5)
Methemoglobin: <0.7 % (ref 0.0–1.5)
O2 Saturation: 82.7 %
Total hemoglobin: 13.8 g/dL (ref 12.0–16.0)

## 2021-08-16 LAB — GLUCOSE, CAPILLARY
Glucose-Capillary: 87 mg/dL (ref 70–99)
Glucose-Capillary: 76 mg/dL (ref 70–99)
Glucose-Capillary: 74 mg/dL (ref 70–99)

## 2021-08-16 MED ORDER — POTASSIUM CHLORIDE CRYS ER 20 MEQ PO TBCR
40.0000 meq | EXTENDED_RELEASE_TABLET | ORAL | Status: AC
  Administered 2021-08-16: 40 meq via ORAL
  Filled 2021-08-16 (×2): qty 2

## 2021-08-16 MED ORDER — IPRATROPIUM-ALBUTEROL 0.5-2.5 (3) MG/3ML IN SOLN: 3.0000 mL | RESPIRATORY_TRACT | Status: AC | PRN

## 2021-08-16 MED ORDER — SPIRONOLACTONE 25 MG PO TABS: 12.5000 mg | ORAL_TABLET | Freq: Every day | ORAL | 5 refills | Status: AC

## 2021-08-16 MED ORDER — APIXABAN 5 MG PO TABS: 5.0000 mg | ORAL_TABLET | Freq: Two times a day (BID) | ORAL | Status: AC

## 2021-08-16 MED ORDER — POTASSIUM CHLORIDE CRYS ER 20 MEQ PO TBCR: 40.0000 meq | EXTENDED_RELEASE_TABLET | Freq: Every day | ORAL | Status: DC

## 2021-08-16 MED ORDER — GERHARDT'S BUTT CREAM: 1.0000 | TOPICAL_CREAM | Freq: Two times a day (BID) | CUTANEOUS | Status: AC

## 2021-08-16 MED ORDER — DIGOXIN 125 MCG PO TABS: 0.1250 mg | ORAL_TABLET | Freq: Every day | ORAL | Status: AC

## 2021-08-16 MED ORDER — MIDODRINE HCL 10 MG PO TABS: 15.0000 mg | ORAL_TABLET | Freq: Three times a day (TID) | ORAL | Status: DC

## 2021-08-16 MED ORDER — URSODIOL 300 MG PO CAPS: 300.0000 mg | ORAL_CAPSULE | Freq: Three times a day (TID) | ORAL | Status: AC

## 2021-08-16 MED ORDER — TORSEMIDE 20 MG PO TABS: 20.0000 mg | ORAL_TABLET | ORAL | Status: AC

## 2021-08-16 MED ORDER — ENSURE ENLIVE PO LIQD: 237.0000 mL | Freq: Three times a day (TID) | ORAL | 12 refills | Status: AC

## 2021-08-16 MED ORDER — ADULT MULTIVITAMIN W/MINERALS CH: 1.0000 | ORAL_TABLET | Freq: Every day | ORAL | Status: AC

## 2021-08-16 MED ORDER — LACTULOSE 10 GM/15ML PO SOLN: 20.0000 g | Freq: Every day | ORAL | 0 refills | Status: AC

## 2021-08-16 NOTE — Progress Notes (Signed)
Occupational Therapy Treatment Patient Details Name: Erik Chan MRN: [id number] DOB: 1955-09-04 Today's Date: 08/16/2021   History of present illness Pt is a 66 y.o. male admitted 07/30/21 with jaundice, generalized weakness (reports not being able to get up since 07/25/21). 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 S/p RHC 6/22 with biventricular failure, concern for low output. PMH includes HF (EF 20%), NICM, COPD, DM2, fibromyalgia.   OT comments  Patient received in supine. Patient required encouragement to participate with OT due to patient stated he just go comfortable. Patient explained the benefits of mobility and agreed to participate. Patient able to get to EOB and performed face and hand hygiene seated on EOB. Patient required increased time before attempting to stand with mod assist and ambulated to door and back. Patient asked to return to supine due to scheduled to discharge by ambulance to SNF at 2. Patient is expected to discharge to SNF today.     Recommendations for follow up therapy are one component of a multi-disciplinary discharge planning process, led by the attending physician.  Recommendations may be updated based on patient status, additional functional criteria and insurance authorization.    Follow Up Recommendations  Skilled nursing-short term rehab (<3 hours/day)    Assistance Recommended at Discharge Frequent or constant Supervision/Assistance  Patient can return home with the following  A lot of help with walking and/or transfers;A lot of help with bathing/dressing/bathroom;Assistance with cooking/housework;Direct supervision/assist for medications management;Direct supervision/assist for financial management;Assist for transportation;Help with stairs or ramp for entrance   Equipment Recommendations  Other (comment)    Recommendations for Other Services      Precautions /  Restrictions Precautions Precautions: Fall Precaution Comments: watch sats and BP Restrictions Weight Bearing Restrictions: No       Mobility Bed Mobility Overal bed mobility: Needs Assistance Bed Mobility: Supine to Sit, Sit to Supine     Supine to sit: Supervision, HOB elevated Sit to supine: Min assist   General bed mobility comments: assistance with BLE to get back to supine    Transfers Overall transfer level: Needs assistance Equipment used: Rolling walker (2 wheels) Transfers: Sit to/from Stand Sit to Stand: Mod assist     Step pivot transfers: Min guard     General transfer comment: mod assist to power up from EOB     Balance Overall balance assessment: Needs assistance Sitting-balance support: No upper extremity supported, Feet supported Sitting balance-Leahy Scale: Fair Sitting balance - Comments: static sitting unsupported   Standing balance support: Bilateral upper extremity supported, Reliant on assistive device for balance Standing balance-Leahy Scale: Poor Standing balance comment: Reliant on RW                           ADL either performed or assessed with clinical judgement   ADL Overall ADL's : Needs assistance/impaired     Grooming: Wash/dry hands;Wash/dry face;Supervision/safety;Sitting Grooming Details (indicate cue type and reason): on EOB             Lower Body Dressing: Maximal assistance;Sitting/lateral leans Lower Body Dressing Details (indicate cue type and reason): to donn socks Toilet Transfer: Moderate assistance Toilet Transfer Details (indicate cue type and reason): simulated           General ADL Comments: performed grooming seated on EOB and minimum mobility    Extremity/Trunk Assessment              Vision  Perception     Praxis      Cognition Arousal/Alertness: Awake/alert Behavior During Therapy: Flat affect Overall Cognitive Status: No family/caregiver present to determine  baseline cognitive functioning Area of Impairment: Attention, Memory, Following commands, Safety/judgement, Awareness, Problem solving                   Current Attention Level: Sustained Memory: Decreased short-term memory Following Commands: Follows one step commands with increased time Safety/Judgement: Decreased awareness of safety, Decreased awareness of deficits Awareness: Emergent Problem Solving: Decreased initiation, Requires verbal cues General Comments: required max verbal cues for encouragement, stated he moved in bed last night        Exercises      Shoulder Instructions       General Comments      Pertinent Vitals/ Pain       Pain Assessment Pain Assessment: Faces Faces Pain Scale: Hurts a little bit Pain Location: generalized Pain Descriptors / Indicators: Tiring Pain Intervention(s): Limited activity within patient's tolerance, Monitored during session, Repositioned  Home Living                                          Prior Functioning/Environment              Frequency  Min 2X/week        Progress Toward Goals  OT Goals(current goals can now be found in the care plan section)  Progress towards OT goals: Progressing toward goals  Acute Rehab OT Goals Patient Stated Goal: get stronger OT Goal Formulation: With patient Time For Goal Achievement: 08/27/21 Potential to Achieve Goals: Fair ADL Goals Pt Will Perform Grooming: with set-up;sitting Pt Will Perform Lower Body Bathing: with min assist;sitting/lateral leans;sit to/from stand Pt Will Perform Lower Body Dressing: with min assist;sitting/lateral leans;sit to/from stand Pt Will Transfer to Toilet: with supervision;stand pivot transfer;bedside commode Pt Will Perform Toileting - Clothing Manipulation and hygiene: with supervision;sitting/lateral leans  Plan Discharge plan remains appropriate    Co-evaluation                 AM-PAC OT "6 Clicks" Daily  Activity     Outcome Measure   Help from another person eating meals?: A Little Help from another person taking care of personal grooming?: A Little Help from another person toileting, which includes using toliet, bedpan, or urinal?: A Lot Help from another person bathing (including washing, rinsing, drying)?: A Lot Help from another person to put on and taking off regular upper body clothing?: A Lot Help from another person to put on and taking off regular lower body clothing?: A Lot 6 Click Score: 14    End of Session Equipment Utilized During Treatment: Gait belt;Rolling walker (2 wheels)  OT Visit Diagnosis: Unsteadiness on feet (R26.81);Other abnormalities of gait and mobility (R26.89);Muscle weakness (generalized) (M62.81)   Activity Tolerance Patient limited by fatigue;Other (comment) (patient self limiting)   Patient Left in bed;with call bell/phone within reach   Nurse Communication Mobility status        Time: 8295-6213 OT Time Calculation (min): 32 min  Charges: OT General Charges $OT Visit: 1 Visit OT Treatments $Self Care/Home Management : 8-22 mins $Therapeutic Activity: 8-22 mins  Alfonse Flavors, OTA Acute Rehabilitation Services  Office [phone]   Dewain Penning 08/16/2021, 2:21 PM

## 2021-08-16 NOTE — Discharge Summary (Signed)
PATIENT DETAILS Name: Erik Chan Age: 66 y.o. Sex: male Date of Birth: 1955-10-05 MRN: [id number]. Admitting Physician: Vianne Bulls, MD [id number], Lattie Haw, MD  Admit Date: 07/30/2021 Discharge date: 08/16/2021  Recommendations for Outpatient Follow-up:  Follow up with PCP in 1-2 weeks Please obtain CMP/CBC in one week LFTs with PT/INR once a week until LFTs normalize. Continue ursodiol until LFTs completely normalized. Ensure follow-up with CHF clinic and [facility] gastroenterology. AVOID AMIODARONE IN THE FUTURE Oral diabetic medications on hold due to hypoglycemia-CBGs stable without the use of any medications including insulin-please follow CBGs closely and add medications accordingly.  Admitted From:  Home  Disposition: Skilled nursing facility   Discharge Condition: fair  CODE STATUS:   Code Status: DNR   Diet recommendation:  Diet Order             Diet - low sodium heart healthy           Diet Carb Modified           Diet regular Room service appropriate? Yes; Fluid consistency: Thin; Fluid restriction: 1200 mL Fluid  Diet effective now                    Brief Summary: Patient is a 66 y.o.  male history of chronic HFpEF/biventricular heart failure-who presented with painless jaundice-found to have drug-induced liver injury on a liver biopsy.  See below for further details.   Significant studies: 6/20>> Tylenol level:<10 6/20>> acute hepatitis serology: Negative 6/20>> RUQ ultrasound: Large gallstone/gallbladder sludge/increased echogenicity of liver parenchyma 6/20>> CT abdomen/pelvis: Bilateral pleural effusion/small ascites/anasarca/cholelithiasis 6/21>> MRCP: No findings to suggest choledocholithiasis. 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  Spoke with GI MD on-call-Dr. Karki-recommendations are for LFT/PT/INR once a week until LFTs normalize-to continue ursodiol 3 times daily until LFTs normalize.  It is anticipated that his LFTs/bilirubin levels will be deranged for several weeks and will gradually improved.  Please ensure outpatient follow-up with gastroenterology.   Acute on chronic combined HFrEF/HFpEF/biventricular heart failure: Advanced heart failure team followed closely-no longer on milrinone/IV diuretics-BP soft but stable-following-no longer on milrinone/IV diuretics-BP soft but stable-volume status reasonable-remains on digoxin/spironolactone/midodrine.   Persistent atrial fibrillation: Rate controlled-not a candidate for anticoagulation with severe liver injury.  BP soft-Limited options with rate control agents.  Not a candidate for amiodarone due to ongoing hepatitis.  Avoid amiodarone in the future.   Hyponatremia: Due to CHF-mild-on diuretics-follow.   Hypokalemia: Repleted.   AKI: Hemodynamically mediated-cardiorenal/hepatorenal syndrome-improved-remains on midodrine-no longer on octreotide.  COPD: Continue bronchodilators-not in exacerbation.   Bilateral pleural effusion: Likely related to CHF-given stability-no indication for paracentesis.   DM-2 current (A1c 6.7 on 01/23): Continue to monitor CBGs-SSI and hold due to hypoglycemic episode.  Please continue to monitor CBGs closely-and resume diabetic medications if they start creeping up.  Recent Labs     08/15/21 1724 08/15/21 2056 08/16/21 0619  GLUCAP [phone]     Nutrition Status: Nutrition Problem: Severe Malnutrition Etiology: chronic illness (CHF, COPD) Signs/Symptoms:  severe muscle depletion, severe fat depletion Interventions: Liberalize Diet, Ensure Enlive (each supplement provides 350kcal and 20 grams of protein), MVI   Pressure Ulcer: Pressure Injury 07/31/21 Sacrum Stage 2 -  Partial thickness loss of dermis presenting as a shallow open injury with a red, pink wound bed without slough. (Active)  07/31/21 1830  Location: Sacrum  Location Orientation:   Staging: Stage 2 -  Partial thickness loss of dermis presenting as a shallow open injury with a red, pink wound bed without slough.  Wound Description (Comments):   Present on Admission: Yes  Dressing Type Foam - Lift dressing to assess site every shift 08/15/21 1942   BMI: Estimated body mass index is 27.48 kg/m as calculated from the following:   Height as of this encounter: 6\' 2"  (1.88 m).   Weight as of this encounter: 97.1 kg.    Discharge Diagnoses:  Principal Problem:   Acute hepatitis Active Problems:   Acute liver failure   Acute combined systolic and diastolic congestive heart failure (HCC)   Hyponatremia   AKI (acute kidney injury) (HCC)   Paroxysmal atrial fibrillation (HCC)   NSVT (nonsustained ventricular tachycardia) (HCC)   COPD (chronic obstructive pulmonary disease) (HCC)   Pleural effusion   Non-insulin dependent type 2 diabetes mellitus (HCC)   Protein-calorie malnutrition, severe   Status post PICC central line placement   Pressure ulcer   Chronic systolic CHF (congestive heart failure) ([place])   Coagulopathy (HCC)   Chronic right heart failure [facility])   Discharge Instructions:  Activity:  As tolerated with Full fall precautions use walker/cane & assistance as needed   Discharge Instructions     (HEART FAILURE PATIENTS) Call MD:  Anytime you have any of the following symptoms:  1) 3 pound weight gain in 24 hours or 5 pounds in 1 week 2) shortness of breath, with or without a dry hacking cough 3) swelling in the hands, feet or stomach 4) if you have to sleep on extra pillows at night in order to breathe.   Complete by: As directed    Diet - low sodium heart healthy   Complete by: As directed    Diet Carb Modified   Complete by: As directed    Discharge instructions   Complete by: As directed    Follow with Primary MD  Kathyrn Lass, MD in 1-2 weeks  Please ensure follow-up with [facility] and advanced heart failure clinic.  Please check CBGs before meals and at bedtime-all diabetic medications currently on hold-if CBGs start creeping up-please resume.  Please get a complete blood count and chemistry panel checked by your Primary MD at your next visit, and again as instructed by your Primary MD.  Get Medicines reviewed and adjusted: Please take all your medications with you for your next visit with your Primary MD  Laboratory/radiological data: Please request your Primary MD to go over all hospital tests and procedure/radiological results at the follow up, please ask your Primary MD to get all Hospital records sent to his/her office.  In some cases, they will be blood work, cultures and biopsy results pending at the time of your discharge. Please request that your primary care M.D. follows up on these results.  Also Note the following: If you experience worsening of your admission symptoms, develop shortness of breath, life threatening emergency, suicidal or homicidal thoughts you must seek medical attention immediately by calling 911 or calling your MD immediately  if symptoms less severe.  You must read complete  instructions/literature along with all the possible adverse reactions/side effects for all the Medicines you take and that have been prescribed to you. Take any new Medicines after you have completely understood and accpet all the possible adverse  reactions/side effects.   Do not drive when taking Pain medications or sleeping medications (Benzodaizepines)  Do not take more than prescribed Pain, Sleep and Anxiety Medications. It is not advisable to combine anxiety,sleep and pain medications without talking with your primary care practitioner  Special Instructions: If you have smoked or chewed Tobacco  in the last 2 yrs please stop smoking, stop any regular Alcohol  and or any Recreational drug use.  Wear Seat belts while driving.  Please note: You were cared for by a hospitalist during your hospital stay. Once you are discharged, your primary care physician will handle any further medical issues. Please note that NO REFILLS for any discharge medications will be authorized once you are discharged, as it is imperative that you return to your primary care physician (or establish a relationship with a primary care physician if you do not have one) for your post hospital discharge needs so that they can reassess your need for medications and monitor your lab values.   Increase activity slowly   Complete by: As directed    No dressing needed   Complete by: As directed       Allergies as of 08/16/2021       Reactions   Amiodarone Other (See Comments)   Liver injury   Mexiletine Hcl Other (See Comments)   Liver injury   Shellfish-derived Products Hives, Rash        Medication List     STOP taking these medications    acetaminophen 500 MG tablet Commonly known as: TYLENOL   amiodarone 200 MG tablet Commonly known as: PACERONE   empagliflozin 10 MG Tabs tablet Commonly known as: JARDIANCE   mexiletine 200 MG capsule Commonly known as: MEXITIL       TAKE these medications    albuterol 108 (90 Base) MCG/ACT inhaler Commonly known as: VENTOLIN HFA Inhale 1-2 puffs into the lungs every 4 (four) hours as needed for wheezing or shortness of breath.   apixaban 5 MG Tabs tablet Commonly known as: ELIQUIS Take 1 tablet (5  mg total) by mouth 2 (two) times daily.   digoxin 0.125 MG tablet Commonly known as: LANOXIN Take 1 tablet (0.125 mg total) by mouth daily.   feeding supplement Liqd Take 237 mLs by mouth 3 (three) times daily between meals.   Gerhardt's butt cream Crea Apply 1 Application topically 2 (two) times daily. Apply Gerhardts cream to buttocks/sacrum BID and PRN when turning or cleaning   ipratropium-albuterol 0.5-2.5 (3) MG/3ML Soln Commonly known as: DUONEB Take 3 mLs by nebulization every 4 (four) hours as needed.   lactulose 10 GM/15ML solution Commonly known as: CHRONULAC Take 30 mLs (20 g total) by mouth daily.   midodrine 10 MG tablet Commonly known as: PROAMATINE Take 1.5 tablets (15 mg total) by mouth 3 (three) times daily with meals. What changed:  medication strength how much to take   multivitamin with minerals Tabs tablet Take 1 tablet by mouth daily.   potassium chloride SA 20 MEQ tablet Commonly known as: KLOR-CON M Take 2 tablets (40 mEq total) by mouth daily. Start taking on: August 17, 2021   spironolactone 25 MG tablet Commonly known as: ALDACTONE Take 0.5 tablets (12.5 mg total) by mouth daily. What changed: how much to  take   torsemide 20 MG tablet Commonly known as: DEMADEX Take 1 tablet (20 mg total) by mouth every other day. Start taking on: August 17, 2021   ursodiol 300 MG capsule Commonly known as: ACTIGALL Take 1 capsule (300 mg total) by mouth 3 (three) times daily.               Discharge Care Instructions  (From admission, onward)           Start     Ordered   08/16/21 0000  No dressing needed        08/16/21 [id number]            Follow-up Information     Kathyrn Lass, MD. Schedule an appointment as soon as possible for a visit in 1 week(s).   Specialty: Family Medicine Contact information: [place] [address] [phone]         Jolaine Artist, MD Follow up in 1 week(s).   Specialty:  Cardiology Contact information: [place] [address] [phone]                Allergies  Allergen Reactions   Amiodarone Other (See Comments)    Liver injury   Mexiletine Hcl Other (See Comments)    Liver injury   Shellfish-Derived Products Hives and Rash     Other Procedures/Studies: DG Chest Port 1 View  Result Date: 08/10/2021 CLINICAL DATA:  PICC in central line placement. EXAM: PORTABLE CHEST 1 VIEW COMPARISON:  02/22/2021 and 02/21/2021 radiographs.  CT 02/22/2021. FINDINGS: 1225 hours. Right arm PICC projects to the superior cavoatrial junction. Left IJ central venous catheter projects to the lower SVC level. The heart size and mediastinal contours are stable. There are probable small bilateral pleural effusions with associated bibasilar atelectasis. No pneumothorax or confluent airspace opacity. The bones appear unremarkable. Telemetry leads overlie the chest. IMPRESSION: Satisfactory position of the PICC and central lines as described. No pneumothorax. Probable small bilateral pleural effusions. Electronically Signed   By: Richardean Sale M.D.   On: 08/10/2021 12:47   [place] EKG SITE RITE  Result Date: 08/10/2021 If Site Rite image not attached, placement could not be confirmed due to current cardiac rhythm.  IR Transcatheter BX  Result Date: 08/08/2021 INDICATION: 66 year old male with liver failure referred for transjugular liver biopsy EXAM: ULTRASOUND-GUIDED RIGHT JUGULAR VEIN ACCESS IMAGE GUIDED TRANSJUGULAR BIOPSY MEDICATIONS: None. ANESTHESIA/SEDATION: Moderate (conscious) sedation was not employed during this procedure. Total intra-service moderate Sedation Time: 0 minutes. The patient's level of consciousness and vital signs were monitored continuously by radiology nursing throughout the procedure under my direct supervision. COMPLICATIONS: None PROCEDURE: Informed written consent was obtained from the patient after a thorough  discussion of the procedural risks, benefits and alternatives. All questions were addressed. Maximal Sterile Barrier Technique was utilized including caps, mask, sterile gowns, sterile gloves, sterile drape, hand hygiene and skin antiseptic. A timeout was performed prior to the initiation of the procedure. The right neck and chest was prepped with chlorhexidine, and draped in the usual sterile fashion using maximum barrier technique (cap and mask, sterile gown, sterile gloves, large sterile sheet, hand hygiene and cutaneous antiseptic). Local anesthesia was attained by infiltration with 1% lidocaine without epinephrine. Ultrasound demonstrated patency of the right internal jugular vein, and this was documented with an image. Under real-time ultrasound guidance, this vein was accessed with a 21 gauge micropuncture needle and image documentation was performed. A small dermatotomy was made  at the access site with an 11 scalpel. A 0.018" wire was advanced into the SVC and the access needle exchanged for a 68F micropuncture vascular sheath. The 0.018" wire was then removed and a 0.035" wire advanced into the IVC. A 9 French sheath was placed over the wire, and a combination of the Bentson wire an angled catheter were used to select the hepatic veins. Position of the catheter was confirmed with small contrast injection. Once the catheter was confirmed within distal hepatic veins, pressure measurements were acquired of the free hepatic vein pressure and a wedge pressure. Once the cannula was positioned, 4 separate 20 gauge biopsy were achieved, placed into saline. Final injection was performed. Patient tolerated the procedure well and remained hemodynamically stable throughout. No complications were encountered and no significant blood loss was encountered. FINDINGS: Wedge pressure: 10 mmHg Free hepatic pressures: 6 mm Hg No evidence of portal hypertension IMPRESSION: Status post ultrasound guided access right internal  jugular vein and transjugular liver biopsy. Signed, Dulcy Fanny. Nadene Rubins, RPVI [facility] Radiology Electronically Signed   By: Corrie Mckusick D.O.   On: 08/08/2021 14:00   IR US Guide Vasc Access Right  Result Date: 08/08/2021 INDICATION: 66 year old male with liver failure referred for transjugular liver biopsy EXAM: ULTRASOUND-GUIDED RIGHT JUGULAR VEIN ACCESS IMAGE GUIDED TRANSJUGULAR BIOPSY MEDICATIONS: None. ANESTHESIA/SEDATION: Moderate (conscious) sedation was not employed during this procedure. Total intra-service moderate Sedation Time: 0 minutes. The patient's level of consciousness and vital signs were monitored continuously by radiology nursing throughout the procedure under my direct supervision. COMPLICATIONS: None PROCEDURE: Informed written consent was obtained from the patient after a thorough discussion of the procedural risks, benefits and alternatives. All questions were addressed. Maximal Sterile Barrier Technique was utilized including caps, mask, sterile gowns, sterile gloves, sterile drape, hand hygiene and skin antiseptic. A timeout was performed prior to the initiation of the procedure. The right neck and chest was prepped with chlorhexidine, and draped in the usual sterile fashion using maximum barrier technique (cap and mask, sterile gown, sterile gloves, large sterile sheet, hand hygiene and cutaneous antiseptic). Local anesthesia was attained by infiltration with 1% lidocaine without epinephrine. Ultrasound demonstrated patency of the right internal jugular vein, and this was documented with an image. Under real-time ultrasound guidance, this vein was accessed with a 21 gauge micropuncture needle and image documentation was performed. A small dermatotomy was made at the access site with an 11 scalpel. A 0.018" wire was advanced into the SVC and the access needle exchanged for a 68F micropuncture vascular sheath. The 0.018" wire  was then removed and a 0.035" wire advanced into the IVC. A 9 French sheath was placed over the wire, and a combination of the Bentson wire an angled catheter were used to select the hepatic veins. Position of the catheter was confirmed with small contrast injection. Once the catheter was confirmed within distal hepatic veins, pressure measurements were acquired of the free hepatic vein pressure and a wedge pressure. Once the cannula was positioned, 4 separate 20 gauge biopsy were achieved, placed into saline. Final injection was performed. Patient tolerated the procedure well and remained hemodynamically stable throughout. No complications were encountered and no significant blood loss was encountered. FINDINGS: Wedge pressure: 10 mmHg Free hepatic pressures: 6 mm Hg No evidence of portal hypertension IMPRESSION: Status post ultrasound guided access right internal jugular vein and transjugular liver biopsy. Signed, Dulcy Fanny. Nadene Rubins, RPVI [facility] Radiology  Electronically Signed   By: Corrie Mckusick D.O.   On: 08/08/2021 14:00   IR Venogram Hepatic W Hemodynamic Evaluation  Result Date: 08/08/2021 INDICATION: 66 year old male with liver failure referred for transjugular liver biopsy EXAM: ULTRASOUND-GUIDED RIGHT JUGULAR VEIN ACCESS IMAGE GUIDED TRANSJUGULAR BIOPSY MEDICATIONS: None. ANESTHESIA/SEDATION: Moderate (conscious) sedation was not employed during this procedure. Total intra-service moderate Sedation Time: 0 minutes. The patient's level of consciousness and vital signs were monitored continuously by radiology nursing throughout the procedure under my direct supervision. COMPLICATIONS: None PROCEDURE: Informed written consent was obtained from the patient after a thorough discussion of the procedural risks, benefits and alternatives. All questions were addressed. Maximal Sterile Barrier Technique was utilized including caps, mask, sterile gowns,  sterile gloves, sterile drape, hand hygiene and skin antiseptic. A timeout was performed prior to the initiation of the procedure. The right neck and chest was prepped with chlorhexidine, and draped in the usual sterile fashion using maximum barrier technique (cap and mask, sterile gown, sterile gloves, large sterile sheet, hand hygiene and cutaneous antiseptic). Local anesthesia was attained by infiltration with 1% lidocaine without epinephrine. Ultrasound demonstrated patency of the right internal jugular vein, and this was documented with an image. Under real-time ultrasound guidance, this vein was accessed with a 21 gauge micropuncture needle and image documentation was performed. A small dermatotomy was made at the access site with an 11 scalpel. A 0.018" wire was advanced into the SVC and the access needle exchanged for a 66F micropuncture vascular sheath. The 0.018" wire was then removed and a 0.035" wire advanced into the IVC. A 9 French sheath was placed over the wire, and a combination of the Bentson wire an angled catheter were used to select the hepatic veins. Position of the catheter was confirmed with small contrast injection. Once the catheter was confirmed within distal hepatic veins, pressure measurements were acquired of the free hepatic vein pressure and a wedge pressure. Once the cannula was positioned, 4 separate 20 gauge biopsy were achieved, placed into saline. Final injection was performed. Patient tolerated the procedure well and remained hemodynamically stable throughout. No complications were encountered and no significant blood loss was encountered. FINDINGS: Wedge pressure: 10 mmHg Free hepatic pressures: 6 mm Hg No evidence of portal hypertension IMPRESSION: Status post ultrasound guided access right internal jugular vein and transjugular liver biopsy. Signed, Dulcy Fanny. Nadene Rubins, RPVI [facility] Radiology Electronically Signed    By: Corrie Mckusick D.O.   On: 08/08/2021 14:00   CARDIAC CATHETERIZATION  Result Date: 08/01/2021 Findings: Hepatic wedge = 26 RA = 22 RV = 57/24 PA = 55/29 (38) PCW = 26 (v = 34) Fick cardiac output/index = 3.4/1.8 Therm CO/CI = 2.1/1.0 PVR = 3.2 WU FA sat = 97% PA sat = 59%, 59% PAPi = 1.2 Assessment: 1. Severe biventricular failure with low output 2. 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 Remains extremely tenuous. [name], MD 6:12 PM  MR ABDOMEN MRCP W WO CONTAST  Result Date: 07/31/2021 CLINICAL DATA:  Cholelithiasis on CT imaging. EXAM: MRI ABDOMEN WITHOUT AND WITH CONTRAST (INCLUDING MRCP) TECHNIQUE: Multiplanar multisequence MR imaging of the abdomen was performed both before and after the administration of intravenous contrast. Heavily T2-weighted images of the biliary and pancreatic ducts were obtained, and three-dimensional MRCP images were rendered by post processing. CONTRAST:  7.53mL GADAVIST GADOBUTROL 1 MMOL/ML IV SOLN COMPARISON:  CT and ultrasound exams  from 07/30/2021 FINDINGS: Lower chest: Heart is enlarged. Moderate to large bilateral pleural effusions evident. Hepatobiliary: No suspicious focal abnormality within the liver parenchyma. Small focus of differential perfusion identified in the subcapsular liver along the gallbladder fundus, likely benign transient hepatic intensity difference. 1.6 x 1.2 cm gallstone evident. No substantial gallbladder wall thickening or pericholecystic fluid although there is free fluid around the liver. No intrahepatic or extrahepatic biliary dilation. Pancreas: No focal mass lesion. No dilatation of the main duct. No intraparenchymal cyst. No peripancreatic edema. Spleen:  No splenomegaly. No focal mass lesion. Adrenals/Urinary Tract: No adrenal nodule or mass. Right kidney unremarkable. 5 cm simple cyst noted upper pole left kidney ([place]) No  followup recommended. Stomach/Bowel: Stomach is unremarkable. No gastric wall thickening. No evidence of outlet obstruction. Duodenum is normally positioned as is the ligament of Treitz. No small bowel or colonic dilatation within the visualized abdomen. Vascular/Lymphatic: No abdominal aortic aneurysm. No abdominal lymphadenopathy. Other: Diffuse mesenteric edema is associated with small to moderate volume ascites. Musculoskeletal: Diffuse body wall edema evident. No focal suspicious marrow enhancement within the visualized bony anatomy. IMPRESSION: 1. Cholelithiasis without intrahepatic or extrahepatic biliary dilation. No findings to suggest choledocholithiasis. 2. Moderate to large bilateral pleural effusions. 3. Diffuse mesenteric edema with small to moderate volume ascites. 4. Diffuse body wall edema. Electronically Signed   By: Kennith Center M.D.   On: 07/31/2021 10:03   MR 3D Recon At Scanner  Result Date: 07/31/2021 CLINICAL DATA:  Cholelithiasis on CT imaging. EXAM: MRI ABDOMEN WITHOUT AND WITH CONTRAST (INCLUDING MRCP) TECHNIQUE: Multiplanar multisequence MR imaging of the abdomen was performed both before and after the administration of intravenous contrast. Heavily T2-weighted images of the biliary and pancreatic ducts were obtained, and three-dimensional MRCP images were rendered by post processing. CONTRAST:  7.80mL GADAVIST GADOBUTROL 1 MMOL/ML IV SOLN COMPARISON:  CT and ultrasound exams from 07/30/2021 FINDINGS: Lower chest: Heart is enlarged. Moderate to large bilateral pleural effusions evident. Hepatobiliary: No suspicious focal abnormality within the liver parenchyma. Small focus of differential perfusion identified in the subcapsular liver along the gallbladder fundus, likely benign transient hepatic intensity difference. 1.6 x 1.2 cm gallstone evident. No substantial gallbladder wall thickening or pericholecystic fluid although there is free fluid around the liver. No intrahepatic or  extrahepatic biliary dilation. Pancreas: No focal mass lesion. No dilatation of the main duct. No intraparenchymal cyst. No peripancreatic edema. Spleen:  No splenomegaly. No focal mass lesion. Adrenals/Urinary Tract: No adrenal nodule or mass. Right kidney unremarkable. 5 cm simple cyst noted upper pole left kidney (Bosniak) No followup recommended. Stomach/Bowel: Stomach is unremarkable. No gastric wall thickening. No evidence of outlet obstruction. Duodenum is normally positioned as is the ligament of Treitz. No small bowel or colonic dilatation within the visualized abdomen. Vascular/Lymphatic: No abdominal aortic aneurysm. No abdominal lymphadenopathy. Other: Diffuse mesenteric edema is associated with small to moderate volume ascites. Musculoskeletal: Diffuse body wall edema evident. No focal suspicious marrow enhancement within the visualized bony anatomy. IMPRESSION: 1. Cholelithiasis without intrahepatic or extrahepatic biliary dilation. No findings to suggest choledocholithiasis. 2. Moderate to large bilateral pleural effusions. 3. Diffuse mesenteric edema with small to moderate volume ascites. 4. Diffuse body wall edema. Electronically Signed   By: Kennith Center M.D.   On: 07/31/2021 10:03   CT ABDOMEN PELVIS WO CONTRAST  Result Date: 07/30/2021 CLINICAL DATA:  Ascites and jaundice. EXAM: CT ABDOMEN AND PELVIS WITHOUT CONTRAST TECHNIQUE: Multidetector CT imaging of the abdomen and pelvis was performed following the standard protocol  without IV contrast. RADIATION DOSE REDUCTION: This exam was performed according to the departmental dose-optimization program which includes automated exposure control, adjustment of the mA and/or kV according to patient size and/or use of iterative reconstruction technique. COMPARISON:  Abdominal ultrasound dated 07/30/2021. FINDINGS: Evaluation of this exam is limited in the absence of intravenous contrast. Lower chest: Partially visualized moderate right and small left  pleural effusions with partial compressive atelectasis of the lung bases versus pneumonia. There is mild cardiomegaly with mild biatrial dilatation. No intra-abdominal free air. Diffuse mesenteric edema and small ascites. Hepatobiliary: The liver is unremarkable. No biliary ductal dilatation. There is a gallstone. High attenuating content within the gallbladder most consistent with vicarious excretion of recently administered contrast. Pancreas: The pancreas is unremarkable as visualized. Spleen: Normal in size without focal abnormality. Adrenals/Urinary Tract: The adrenal glands are unremarkable. There is no hydronephrosis or nephrolithiasis on either side. There is a 4.5 cm left renal upper pole cyst. The visualized ureters and urinary bladder appear unremarkable. Stomach/Bowel: There is sigmoid diverticulosis. There is no bowel obstruction. The appendix is normal. Vascular/Lymphatic: Mild aortoiliac atherosclerotic disease. The IVC is unremarkable. No portal venous gas. There is no adenopathy. Reproductive: The prostate and seminal vesicles are grossly unremarkable. No pelvic mass. Other: Diffuse mesenteric and subcutaneous edema and anasarca. Musculoskeletal: No acute or significant osseous findings. IMPRESSION: 1. Bilateral pleural effusions, small ascites, and anasarca. 2. Cholelithiasis. 3. Sigmoid diverticulosis. No bowel obstruction. Normal appendix. 4. Aortic Atherosclerosis (ICD10-I70.0). Electronically Signed   By: Elgie Collard M.D.   On: 07/30/2021 22:16   US Abdomen Limited RUQ (LIVER/GB)  Result Date: 07/30/2021 CLINICAL DATA:  Jaundice EXAM: ULTRASOUND ABDOMEN LIMITED RIGHT UPPER QUADRANT COMPARISON:  Abdominal ultrasound 01/30/2021 FINDINGS: Gallbladder: 2.2 cm echogenic shadowing calculus. Low-level echogenic sludge. No wall thickening or sonographic Murphy's sign. Common bile duct: Diameter: 4 mm Liver: Increased echogenicity of the parenchyma with no focal mass identified. Portal vein is  patent on color Doppler imaging with normal direction of blood flow towards the liver. Other: Right pleural effusion.  Ascites. IMPRESSION: 1. Right pleural effusion. 2. Ascites. 3. Increased echogenicity of the liver parenchyma which may represent hepatic steatosis and/or other hepatocellular disease. 4. Large gallstone.  Gallbladder sludge. Electronically Signed   By: Jannifer Hick M.D.   On: 07/30/2021 19:11     TODAY-DAY OF DISCHARGE:  Subjective:   Cristela Blue today has no headache,no chest abdominal pain,no new weakness tingling or numbness, feels much better wants to go home today.   Objective:   Blood pressure (!) 91/59, pulse 69, temperature 98.3 F (36.8 C), temperature source Oral, resp. rate 14, height 6\' 2"  (1.88 m), weight 97.1 kg, SpO2 93 %.  Intake/Output Summary (Last 24 hours) at 08/16/2021 0853 Last data filed at 08/15/2021 1946 Gross per 24 hour  Intake --  Output 2600 ml  Net -2600 ml   Filed Weights   08/13/21 0318 08/15/21 0417 08/16/21 0430  Weight: 102.5 kg 97.5 kg 97.1 kg    Exam: Awake Alert, Oriented *3, No new F.N deficits, Normal affect [place].AT,PERRAL Supple Neck,No JVD, No cervical lymphadenopathy appriciated.  Symmetrical Chest wall movement, Good air movement bilaterally, CTAB RRR,No Gallops,Rubs or new Murmurs, No Parasternal Heave +ve B.Sounds, Abd Soft, Non tender, No organomegaly appriciated, No rebound -guarding or rigidity. No Cyanosis, Clubbing or edema, No new Rash or bruise   PERTINENT RADIOLOGIC STUDIES: No results found.   PERTINENT LAB RESULTS: CBC: Recent Labs    08/15/21 0815 08/16/21 0340  WBC  6.8 6.9  HGB 12.8* 13.4  HCT 37.2* 38.5*  PLT 124* 141*   CMET CMP     Component Value Date/Time   NA 132 (L) 08/16/2021 0340   NA 135 03/25/2021 1221   K 3.0 (L) 08/16/2021 0340   CL 82 (L) 08/16/2021 0340   CO2 37 (H) 08/16/2021 0340   GLUCOSE 78 08/16/2021 0340   BUN 12 08/16/2021 0340   BUN 13 03/25/2021 1221    CREATININE 0.95 08/16/2021 0340   CALCIUM 8.2 (L) 08/16/2021 0340   PROT 5.9 (L) 08/16/2021 0340   PROT 6.6 02/01/2021 1522   ALBUMIN 2.2 (L) 08/16/2021 0340   ALBUMIN 4.0 02/01/2021 1522   AST 138 (H) 08/16/2021 0340   ALT 149 (H) 08/16/2021 0340   ALKPHOS 172 (H) 08/16/2021 0340   BILITOT 17.6 (H) 08/16/2021 0340   BILITOT 2.0 (H) 02/01/2021 1522   GFRNONAA >60 08/16/2021 0340    GFR Estimated Creatinine Clearance: 88.9 mL/min (by C-G formula based on SCr of 0.95 mg/dL). No results for input(s): "LIPASE", "AMYLASE" in the last 72 hours. No results for input(s): "CKTOTAL", "CKMB", "CKMBINDEX", "TROPONINI" in the last 72 hours. Invalid input(s): "POCBNP" No results for input(s): "DDIMER" in the last 72 hours. No results for input(s): "HGBA1C" in the last 72 hours. No results for input(s): "CHOL", "HDL", "LDLCALC", "TRIG", "CHOLHDL", "LDLDIRECT" in the last 72 hours. No results for input(s): "TSH", "T4TOTAL", "T3FREE", "THYROIDAB" in the last 72 hours.  Invalid input(s): "FREET3" No results for input(s): "VITAMINB12", "FOLATE", "FERRITIN", "TIBC", "IRON", "RETICCTPCT" in the last 72 hours. Coags: Recent Labs    08/14/21 1002  INR 1.4*   Microbiology: Recent Results (from the past 240 hour(s))  MRSA Next Gen by PCR, Nasal     Status: None   Collection Time: 08/15/21  4:34 AM   Specimen: Nasal Mucosa; Nasal Swab  Result Value Ref Range Status   MRSA by PCR Next Gen NOT DETECTED NOT DETECTED Final    Comment: (NOTE) The GeneXpert MRSA Assay (FDA approved for NASAL specimens only), is one component of a comprehensive MRSA colonization surveillance program. It is not intended to diagnose MRSA infection nor to guide or monitor treatment for MRSA infections. Test performance is not FDA approved in patients less than 13 years old. Performed at [facility], [place] [address] [place], [place] [address]     FURTHER DISCHARGE INSTRUCTIONS:  Get Medicines reviewed and  adjusted: Please take all your medications with you for your next visit with your Primary MD  Laboratory/radiological data: Please request your Primary MD to go over all hospital tests and procedure/radiological results at the follow up, please ask your Primary MD to get all Hospital records sent to his/her office.  In some cases, they will be blood work, cultures and biopsy results pending at the time of your discharge. Please request that your primary care M.D. goes through all the records of your hospital data and follows up on these results.  Also Note the following: If you experience worsening of your admission symptoms, develop shortness of breath, life threatening emergency, suicidal or homicidal thoughts you must seek medical attention immediately by calling 911 or calling your MD immediately  if symptoms less severe.  You must read complete instructions/literature along with all the possible adverse reactions/side effects for all the Medicines you take and that have been prescribed to you. Take any new Medicines after you have completely understood and accpet all the possible adverse reactions/side effects.  Do not drive when taking Pain medications or sleeping medications (Benzodaizepines)  Do not take more than prescribed Pain, Sleep and Anxiety Medications. It is not advisable to combine anxiety,sleep and pain medications without talking with your primary care practitioner  Special Instructions: If you have smoked or chewed Tobacco  in the last 2 yrs please stop smoking, stop any regular Alcohol  and or any Recreational drug use.  Wear Seat belts while driving.  Please note: You were cared for by a hospitalist during your hospital stay. Once you are discharged, your primary care physician will handle any further medical issues. Please note that NO REFILLS for any discharge medications will be authorized once you are discharged, as it is imperative that you return to your primary  care physician (or establish a relationship with a primary care physician if you do not have one) for your post hospital discharge needs so that they can reassess your need for medications and monitor your lab values.  Total Time spent coordinating discharge including counseling, education and face to face time equals greater than 30 minutes.  SignedJeoffrey Massed 08/16/2021 8:53 AM

## 2021-08-16 NOTE — TOC Transition Note (Addendum)
Transition of Care [facility]) - CM/SW Discharge Note   Patient Details  Name: Erik Chan MRN: [id number] Date of Birth: Jul 05, 1955  [facility] CM/SW Contact:  Lynett Grimes Phone Number: 08/16/2021, 10:22 AM   Clinical Narrative:    [facility] will [facility] to[name] Anticipated DC date: 08/16/2021 Family notified: Pt Mother Transport by: Sharin Mons   Per MD patient ready for DC to [place]. RN to call report prior to discharge [phone]). RN, patient, patient's family, and facility notified of DC. Discharge Summary and FL2 sent to facility. DC packet on chart. Ambulance transport requested for patient.   CSW will sign off for now as social work intervention is no longer needed. Please consult [place] again if new needs arise.           Patient Goals and CMS Choice        Discharge Placement                       Discharge Plan and Services                                     Social Determinants of Health (SDOH) Interventions     Readmission Risk Interventions     No data to display

## 2021-08-19 DIAGNOSIS — I5020 Unspecified systolic (congestive) heart failure: Secondary | ICD-10-CM | POA: Diagnosis not present

## 2021-08-21 DIAGNOSIS — I5020 Unspecified systolic (congestive) heart failure: Secondary | ICD-10-CM | POA: Diagnosis not present

## 2021-08-23 ENCOUNTER — Encounter (HOSPITAL_COMMUNITY): Payer: Medicare Other

## 2021-08-28 ENCOUNTER — Encounter (HOSPITAL_COMMUNITY): Payer: Self-pay

## 2021-08-28 ENCOUNTER — Ambulatory Visit (HOSPITAL_COMMUNITY)
Admission: RE | Admit: 2021-08-28 | Discharge: 2021-08-28 | Disposition: A | Discharge status: Home / Self Care | Payer: Medicare Other | Source: Ambulatory Visit | Attending: Family Medicine | Admitting: Family Medicine

## 2021-08-28 VITALS — BP 88/56 | HR 98

## 2021-08-28 DIAGNOSIS — I2510 Atherosclerotic heart disease of native coronary artery without angina pectoris: Secondary | ICD-10-CM | POA: Diagnosis not present

## 2021-08-28 DIAGNOSIS — Z7901 Long term (current) use of anticoagulants: Secondary | ICD-10-CM | POA: Insufficient documentation

## 2021-08-28 DIAGNOSIS — Z66 Do not resuscitate: Secondary | ICD-10-CM | POA: Diagnosis not present

## 2021-08-28 DIAGNOSIS — J90 Pleural effusion, not elsewhere classified: Secondary | ICD-10-CM

## 2021-08-28 DIAGNOSIS — J449 Chronic obstructive pulmonary disease, unspecified: Secondary | ICD-10-CM | POA: Diagnosis not present

## 2021-08-28 DIAGNOSIS — I493 Ventricular premature depolarization: Secondary | ICD-10-CM | POA: Diagnosis not present

## 2021-08-28 DIAGNOSIS — I472 Ventricular tachycardia, unspecified: Secondary | ICD-10-CM | POA: Diagnosis not present

## 2021-08-28 DIAGNOSIS — I4891 Unspecified atrial fibrillation: Secondary | ICD-10-CM

## 2021-08-28 DIAGNOSIS — R188 Other ascites: Secondary | ICD-10-CM | POA: Diagnosis not present

## 2021-08-28 DIAGNOSIS — I428 Other cardiomyopathies: Secondary | ICD-10-CM | POA: Diagnosis not present

## 2021-08-28 DIAGNOSIS — I5022 Chronic systolic (congestive) heart failure: Secondary | ICD-10-CM | POA: Diagnosis not present

## 2021-08-28 DIAGNOSIS — E871 Hypo-osmolality and hyponatremia: Secondary | ICD-10-CM

## 2021-08-28 DIAGNOSIS — Z9889 Other specified postprocedural states: Secondary | ICD-10-CM | POA: Diagnosis not present

## 2021-08-28 DIAGNOSIS — Z79899 Other long term (current) drug therapy: Secondary | ICD-10-CM | POA: Insufficient documentation

## 2021-08-28 DIAGNOSIS — I5082 Biventricular heart failure: Secondary | ICD-10-CM | POA: Insufficient documentation

## 2021-08-28 DIAGNOSIS — R7989 Other specified abnormal findings of blood chemistry: Secondary | ICD-10-CM

## 2021-08-28 DIAGNOSIS — L905 Scar conditions and fibrosis of skin: Secondary | ICD-10-CM | POA: Diagnosis not present

## 2021-08-28 DIAGNOSIS — Z87891 Personal history of nicotine dependence: Secondary | ICD-10-CM | POA: Insufficient documentation

## 2021-08-28 DIAGNOSIS — Z515 Encounter for palliative care: Secondary | ICD-10-CM | POA: Diagnosis not present

## 2021-08-28 DIAGNOSIS — K8021 Calculus of gallbladder without cholecystitis with obstruction: Secondary | ICD-10-CM | POA: Insufficient documentation

## 2021-08-28 DIAGNOSIS — Z7189 Other specified counseling: Secondary | ICD-10-CM

## 2021-08-28 DIAGNOSIS — I4729 Other ventricular tachycardia: Secondary | ICD-10-CM | POA: Diagnosis not present

## 2021-08-28 DIAGNOSIS — Z7984 Long term (current) use of oral hypoglycemic drugs: Secondary | ICD-10-CM | POA: Insufficient documentation

## 2021-08-28 DIAGNOSIS — R0602 Shortness of breath: Secondary | ICD-10-CM | POA: Insufficient documentation

## 2021-08-28 LAB — COMPREHENSIVE METABOLIC PANEL
ALT: 117 U/L — ABNORMAL HIGH (ref 0–44)
AST: 112 U/L — ABNORMAL HIGH (ref 15–41)
Albumin: 3 g/dL — ABNORMAL LOW (ref 3.5–5.0)
Alkaline Phosphatase: 146 U/L — ABNORMAL HIGH (ref 38–126)
Anion gap: 18 — ABNORMAL HIGH (ref 5–15)
BUN: 25 mg/dL — ABNORMAL HIGH (ref 8–23)
CO2: 28 mmol/L (ref 22–32)
Calcium: 9.4 mg/dL (ref 8.9–10.3)
Chloride: 84 mmol/L — ABNORMAL LOW (ref 98–111)
Creatinine, Ser: 1.23 mg/dL (ref 0.61–1.24)
GFR, Estimated: >60 mL/min (ref >60)
Glucose, Bld: 104 mg/dL — ABNORMAL HIGH (ref 70–99)
Potassium: 3.4 mmol/L — ABNORMAL LOW (ref 3.5–5.1)
Sodium: 130 mmol/L — ABNORMAL LOW (ref 135–145)
Total Bilirubin: 11.6 mg/dL — ABNORMAL HIGH (ref 0.3–1.2)
Total Protein: 7.5 g/dL (ref 6.5–8.1)

## 2021-08-28 LAB — CBC
HCT: 47.3 % (ref 39.0–52.0)
Hemoglobin: 16.1 g/dL (ref 13.0–17.0)
MCH: 31.9 pg (ref 26.0–34.0)
MCHC: 34 g/dL (ref 30.0–36.0)
MCV: 93.7 fL (ref 80.0–100.0)
Platelets: 296 10*3/uL (ref 150–400)
RBC: 5.05 MIL/uL (ref 4.22–5.81)
RDW: 18.1 % — ABNORMAL HIGH (ref 11.5–15.5)
WBC: 9.3 10*3/uL (ref 4.0–10.5)
nRBC: 0 % (ref 0.0–0.2)

## 2021-08-28 NOTE — Patient Instructions (Addendum)
Thank you for coming in today  Labs were done today, if any labs are abnormal the clinic will call you No news is good news  PLEASE DRAW DIGOXIN LEVEL IN ONE WEEK AND FAX TO CLINIC [phone]  PLEASE SCHEDULE FOLLOW UP WITH PRIMARY CARE PHYSICAIN PLEASE CALL TO SCHEDULE GI FOLLOW UP WITH DR. [facility]  Your physician recommends that you schedule a follow-up appointment in:  August 24th 2023 at 9am 3-4 months with Dr. [name]  At [facility], you and your health needs are our priority. As part of our continuing mission to provide you with exceptional heart care, we have created designated Provider Care Teams. These Care Teams include your primary Cardiologist (physician) and Advanced Practice Providers (APPs- Physician Assistants and Nurse Practitioners) who all work together to provide you with the care you need, when you need it.   You may see any of the following providers on your designated Care Team at your next follow up: Dr Arvilla Meres Dr Carron Curie, NP Robbie Lis, [place] [facility] [place], [place] Karle Plumber, PharmD   Please be sure to bring in all your medications bottles to every appointment.   If you have any questions or concerns before your next appointment please send [place] a message through [place] or call our office at [phone].    TO LEAVE A MESSAGE FOR THE NURSE SELECT OPTION 2, PLEASE LEAVE A MESSAGE INCLUDING: YOUR NAME DATE OF BIRTH CALL BACK NUMBER REASON FOR CALL**this is important as we prioritize the call backs  YOU WILL RECEIVE A CALL BACK THE SAME DAY AS LONG AS YOU CALL BEFORE 4:00 PM

## 2021-08-28 NOTE — Progress Notes (Signed)
Faxed order to [facility]  fax number provided by Kathie Rhodes the receptionist [phone]. Prince Rome NP wanted LFT's and PT/INR drawn weekly and results faxed back to the heart failure clinic

## 2021-08-28 NOTE — Addendum Note (Signed)
Encounter addended by: Demetrius Charity, RN on: 08/28/2021 4:10 PM  Actions taken: Clinical Note Signed

## 2021-08-28 NOTE — Progress Notes (Signed)
ADVANCED HF CLINIC NOTE   Primary Care: Raymondo Band, MD Cardiology: Dr. Claiborne Billings HF Cardiologist: Dr. Haroldine Laws  HPI: Erik Chan is a 66 y.o. male with tobacco abuse and COPD, and new diagnosis of systolic heart failure/NICM.  Admitted 1/23 with new acute HFrEF. Echo showed  EF < 20%. PICC line placed for CVP and co-ox monitoring. Initial co-ox 52%. Started on milrinone. L/RHC on milrinone showed mild CAD, RA 15, PA mean 35, PCW 29 (v 35-40), Fick CO/CI 6.3/2.9. Had > 20% PVC burden with runs of NSVT. Started on IV amio and mexiletine, later switched to amio 200 bid and LifeVest placed. Diuresed with lasix gtt, weight down total of 50 lb during admission. Milrinone weaned off with Co-ox stable at 64.8%.  CT chest showed no sarcoidosis, but moderate right pleural effusion. Underwent right thoracentesis with removal 1200 cc fluid. Underwent cMRI showing LVEF 10%, basal septal wall LGE (mid-wall), apical subendocardial LGE, RV insertion nonspecific scar pattern, RV normal size with severe dysfunction (EF 19%). SPEP/UPEP and multiple myeloma panel negative.  CM etiology include isolated cardiac sarcoidosis vs myocarditis vs genetic cardiomyopathy +/- PVCs. Consider genetic testing and cardiac PET as outpatient. GDMT limited by low BP, but able to start spiro, dig and Jardiance. Discharge weight 173 lbs.  Hospital follow up feeling well with NYHA II symptoms, midodrine decreased.   Home sleep study 2/23 showed no OSA  Seen for follow-up in 4/23 and doing well. NYHA II. Echo showed EF 20-25%. Still requiring midodrine for BP. Referred to EP for consideration of ICD.   Admitted 6/23 with acute liver injury, AKI and concern for a/c biventricular HF with low output. GI consulted, underwent liver bx showing cholestatic hepatitis with moderate-severe cholestasis, minimal hepatitis.  Felt to be likely drug induced liver injury. Rec to avoid amio and mexiletine going forward. Nephrology consulted  d/t hyponatremia, sodium improved with diuresis. AHF consulted, given IV lasix, started on octreotide + midodrine for soft BP. Underwent RHC showing low output and elevated filling pressures, required milrinone. Drips weaned; midodrine, digoxin, spiro 12.5 and torsemide 20 mg qod resumed. Remained off amiodarone, Jardiance and mexiletine. PMT consulted for [place], remained DNR. He was discharged to SNF, weight 214 lbs.  Today he returns for post hospital HF follow up. He is now at [facility] for rehab. He is able to walk about 50 feet with PT before becoming SOB/fatigued. Denies palpitations, abnormal bleeding, CP, dizziness, edema, or PND/Orthopnea. Appetite fair. No fever or chills. Unable to stand for a weight today. Taking all medications provided by facility. Wants to go back home.   Cardiac Studies:  - RHC (6/23):  Hepatic wedge = 26 RA = 22 RV = 57/24  PA = 55/29 (38) PCW = 26 (v = 34) Fick cardiac output/index = 3.4/1.8 Therm CO/CI = 2.1/1.0 PVR = 3.2 WU FA sat = 97% PA sat = 59%, 59%  PAPi = 1.2   1. Severe biventricular failure with low output 2. 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   - Echo (1/23): EF <20%, RV severely reduced, LA/RA severley dilated  - R/LHC (1/23): on milrinone 0.25, moderate non-obs CAD. Ao = 95/65 (79) LV = 91/29 RA = 15 RV = 54/17 PA = 60/24 (35) PCW = 29 (v = 35-40) Fick cardiac output/index = 6.3/2.9 PVR = 0.95 WU SVR = 904 FA sat = 97% PA sat = 68%, 72%  - cMRI (1/23): LVEF 10%, basal septal wall LGE (mid-wall),  apical subendocardial LGE, RV insertion nonspecific scar pattern, RV normal size with severe dysfunction (EF 19%), small pericardial effusion, and moderate R pleural effusion.    Past Medical History:  Diagnosis Date   CHF (congestive heart failure) ([facility]) 02/13/2021   LVEF less than 20%   COPD (chronic obstructive pulmonary disease) ([facility])    Fibromyalgia    Current Outpatient  Medications  Medication Sig Dispense Refill   albuterol (VENTOLIN HFA) 108 (90 Base) MCG/ACT inhaler Inhale 1-2 puffs into the lungs every 4 (four) hours as needed for wheezing or shortness of breath.     apixaban (ELIQUIS) 5 MG TABS tablet Take 1 tablet (5 mg total) by mouth 2 (two) times daily. 60 tablet    digoxin (LANOXIN) 0.125 MG tablet Take 1 tablet (0.125 mg total) by mouth daily.     feeding supplement (ENSURE ENLIVE / ENSURE PLUS) LIQD Take 237 mLs by mouth 3 (three) times daily between meals. 237 mL 12   ipratropium-albuterol (DUONEB) 0.5-2.5 (3) MG/3ML SOLN Take 3 mLs by nebulization every 4 (four) hours as needed. 360 mL    lactulose (CHRONULAC) 10 GM/15ML solution Take 30 mLs (20 g total) by mouth daily. 236 mL 0   midodrine (PROAMATINE) 5 MG tablet Take 5 mg by mouth 3 (three) times daily with meals.     Multiple Vitamin (MULTIVITAMIN WITH MINERALS) TABS tablet Take 1 tablet by mouth daily.     potassium chloride SA (KLOR-CON M) 20 MEQ tablet Take 2 tablets (40 mEq total) by mouth daily.     spironolactone (ALDACTONE) 25 MG tablet Take 0.5 tablets (12.5 mg total) by mouth daily. 30 tablet 5   ursodiol (ACTIGALL) 300 MG capsule Take 1 capsule (300 mg total) by mouth 3 (three) times daily.     Nystatin (GERHARDT'S BUTT CREAM) CREA Apply 1 Application topically 2 (two) times daily. Apply Gerhardts cream to buttocks/sacrum BID and PRN when turning or cleaning     torsemide (DEMADEX) 20 MG tablet Take 1 tablet (20 mg total) by mouth every other day.     No current facility-administered medications for this encounter.   Allergies  Allergen Reactions   Amiodarone Other (See Comments)    Liver injury   Mexiletine Hcl Other (See Comments)    Liver injury   Shellfish-Derived Products Hives and Rash   Social History   Socioeconomic History   Marital status: Married    Spouse name: Not on file   Number of children: Not on file   Years of education: Not on file   Highest education  level: Not on file  Occupational History   Occupation: Banker   Tobacco Use   Smoking status: Former    Packs/day: 1.00    Years: 30.00    Total pack years: 30.00    Types: Pipe, Cigarettes   Smokeless tobacco: Never  Vaping Use   Vaping Use: Never used  Substance and Sexual Activity   Alcohol use: No    Alcohol/week: 0.0 standard drinks of alcohol   Drug use: No   Sexual activity: Not on file  Other Topics Concern   Not on file  Social History Narrative   Not on file   Social Determinants of Health   Financial Resource Strain: Not on file  Food Insecurity: Not on file  Transportation Needs: Not on file  Physical Activity: Not on file  Stress: Not on file  Social Connections: Not on file  Intimate Partner Violence: Not on file   Family  History  Problem Relation Age of Onset   Asthma Mother    Allergies Mother    Fibromyalgia Mother    Osteoarthritis Mother    Heart Problems Father    Osteoarthritis Maternal Grandmother    BP (!) 88/56   Pulse 98   SpO2 98%   Wt Readings from Last 3 Encounters:  08/16/21 97.1 kg (214 lb)  05/30/21 74.6 kg (164 lb 6.4 oz)  04/15/21 74.3 kg (163 lb 12.8 oz)   PHYSICAL EXAM: General:  NAD. No resp difficulty, arrived in [facility], cachectic and chronically-ill appearing. HEENT: Jaundiced Neck: Supple. No JVD. Carotids 2+ bilat; no bruits. No lymphadenopathy or thryomegaly appreciated. Cor: PMI nondisplaced. Tachy irregular rate & rhythm. No rubs, gallops or murmurs. Lungs: Diminished in bases. Abdomen: Soft, nontender, nondistended. No hepatosplenomegaly. No bruits or masses. Good bowel sounds. Extremities: No cyanosis, clubbing, rash, edema Neuro: Alert & oriented x 3, cranial nerves grossly intact. Moves all 4 extremities w/o difficulty. Affect pleasant.  ECG (personally reviewed): atrial flutter, 100 bpm  ASSESSMENT & PLAN: 1. Chronic Biventricular HFrEF/ NICM - Echo (1/23): severely reduced EF < 20% and severely reduced RV.  Moderate to severe MR.  - L/RHC (1/23): moderate non-obstructive CAD. EF < 10% - cMRI (1/23): LVEF 10%, severe biventricular heart failure, basal septal wall mid-wall LGE, apical inferior subendocardial LGE, and RV insertion non specific scar pattern.  - CT chest (1/23): without evidence for pulmonary sarcoidosis.   - Myeloma panel and SPEP/UPEP negative. ACE level OK (37). - Etiology for CM => isolated cardiac sarcoidosis vs prior myocarditis vs genetic cardiomyopathy, +/- PVCs - Cardiac PET previously set up at [facility], has not completed. Genetic testing sent 03/04/21. - Echo 05/30/21 EF 20-25% RV severely reduced.  - Referred to EP for ICD in April - did not schedule appointment, subsequent admission.  - RHC 08/01/21 with low output and elevated filling pressures. - NYHA IIIb, confounded by general deconditioning. Volume looks ok, unable to stand for a weight. - Continue torsemide 20 mg qod.  - Continue midodrine 15 mg tid for BP support. - Continue digoxin 0.125 mg daily.  - Continue spiro 12.5 mg daily. No BP room to increase today. - HF optimized. No diagnostic features of congestive hepatopathy on liver biopsy. Liver biopsy >> cholestatic hepatitis, suspect drug-induced liver injury.  - CMET today. He will need a digoxin trough drawn at facility and faxed here.   2. Atrial fibrillation - New, noted on tele this admit.  - Rate controlled. - Limited rhythm control options. No amiodarone due to liver injury. - Continue Eliquis 5 mg bid. - No change. CBC today.   3. H/o Acute liver injury - US abdomen R pleural effusion, + ascites, increased echogenicity of liver, large gallstone with gallbladder sludge. - CT abdomen pelvis - bilateral pleural effusions, small ascites, anasarca, + gallstone - MRCP- no focal liver abnormality, cholelithiasis without biliary dilatation, moderate to large b/l pleural effusions, mesenteric edema with ascites, diffuse body wall edema - [facility] following. S/p  liver biopsy 06/29 => cholestatic hepatitis with moderate-severe cholestasis, minimal hepatitis.  Felt to be likely drug induced liver injury. Discussed with GI. Offending agent not certain, cannot rule out amiodarone or mexiletine. It is recommended to avoid these medications in the future. - Will need LFTS, PT/INR once a week until LFTs normalize. - Continue ursodiol until LFTs completely normalized - He needs post hospital GI follow up - Labs today.   4. H/o Hyponatremia - h/o hypervolemia -  Free water restrict - Labs today.   5. H/o AKI: - Follow renal function with diuretics. - Midodrine to support BP - Labs today.  6. PVCs/NSVT - >20% PVC burden during admit 01/23 - Rare PVCs on tele during 6/23 admit. - Will avoid adding back amiodarone and mexiletine d/t liver injury. - No OSA on sleep study 2/23.   7. COPD - Former heavy smoker. Quit smoking 01/07/21.   8. H/o Pleural Effusion - s/p rt thoracentesis 1/23 w/ 1200 cc fluid removal. - moderate to large b/l pleural effusions on MR this admit - Oxygen 98% on room air today.  9. [place] on board.  - Opted for DNR   Follow up in 6 weeks with APP and 3-4 months with Dr. Haroldine Laws. He needs GI and PCP post-hospital follow up.  [place], FNP-BC. 08/28/21

## 2021-08-29 ENCOUNTER — Ambulatory Visit (INDEPENDENT_AMBULATORY_CARE_PROVIDER_SITE_OTHER): Payer: Medicare Other | Admitting: Podiatry

## 2021-08-29 ENCOUNTER — Telehealth (HOSPITAL_COMMUNITY): Payer: Self-pay | Admitting: Surgery

## 2021-08-29 ENCOUNTER — Encounter: Payer: Self-pay | Admitting: Podiatry

## 2021-08-29 DIAGNOSIS — M79674 Pain in right toe(s): Secondary | ICD-10-CM | POA: Diagnosis not present

## 2021-08-29 DIAGNOSIS — B351 Tinea unguium: Secondary | ICD-10-CM

## 2021-08-29 DIAGNOSIS — Z8639 Personal history of other endocrine, nutritional and metabolic disease: Secondary | ICD-10-CM | POA: Insufficient documentation

## 2021-08-29 DIAGNOSIS — M79675 Pain in left toe(s): Secondary | ICD-10-CM

## 2021-08-29 NOTE — Telephone Encounter (Signed)
I attempted to contact facility and was transferred to a number to which I left a message regarding results and recommendations per provider.  I also faxed the new orders per Prince Rome NP. Fax attempts x 2 were unsuccessful.  I will re-attempt at another time.

## 2021-08-29 NOTE — Telephone Encounter (Signed)
-----   Message from Jessica M Milford, FNP sent at 08/28/2021  4:49 PM EDT ----- LFTs are slowly trending down. He will have weekly LFTs and PT/INRs drawn until liver enzymes back in normal range.  K is low. Please take extra 40 KCL today x 1 and increase daily KCL to 60 mEq.  He will need repeat BMET in 1-2 weeks at facility ([facility]) and faxed here. 

## 2021-08-30 ENCOUNTER — Telehealth (HOSPITAL_COMMUNITY): Payer: Self-pay | Admitting: Surgery

## 2021-08-30 DIAGNOSIS — I5022 Chronic systolic (congestive) heart failure: Secondary | ICD-10-CM

## 2021-08-30 MED ORDER — POTASSIUM CHLORIDE CRYS ER 20 MEQ PO TBCR: 60.0000 meq | EXTENDED_RELEASE_TABLET | Freq: Every day | ORAL | Status: AC

## 2021-08-30 NOTE — Telephone Encounter (Signed)
I attempted to fax orders again to [name] and was unsuccessful.  I called again and did reach a nurse-Erik Chan.  I reviewed results and recommendations per Erik Rome NP with that nurse.  He tells me that Erik Chan is supposed to discharge from the facility soon. I also called Erik Chan and was able to schedule a lab appt for repeat labs because he will then be at home.  Patient and family are aware and agreeable.

## 2021-08-30 NOTE — Telephone Encounter (Signed)
-----   Message from Jacklynn Ganong, [place] sent at 08/28/2021  4:49 PM EDT ----- LFTs are slowly trending down. He will have weekly LFTs and PT/INRs drawn until liver enzymes back in normal range.  K is low. Please take extra 40 KCL today x 1 and increase daily KCL to 60 mEq.  He will need repeat BMET in 1-2 weeks at facility [name]) and faxed here.

## 2021-09-02 NOTE — Progress Notes (Signed)
  Subjective:  Patient ID: Erik Chan, male    DOB: 05-27-1955,  MRN: [id number]  Chief Complaint  Patient presents with   Diabetes     np diabetic routine foot care   Nail Problem    Thick painful toenails    66 y.o. male presents with the above complaint. History confirmed with patient.  His toenails thickened and elongated  Objective:  Physical Exam: warm, good capillary refill, no trophic changes or ulcerative lesions, and normal DP and PT pulses. Left Foot: dystrophic yellowed discolored nail plates with subungual debris Right Foot: dystrophic yellowed discolored nail plates with subungual debris  Assessment:   1. Pain due to onychomycosis of toenails of both feet      Plan:  Patient was evaluated and treated and all questions answered.  Discussed the etiology and treatment options for the condition in detail with the patient. Educated patient on the topical and oral treatment options for mycotic nails. Recommended debridement of the nails today. Sharp and mechanical debridement performed of all painful and mycotic nails today. Nails debrided in length and thickness using a nail nipper to level of comfort. Discussed treatment options including appropriate shoe gear. Follow up as needed for painful nails.    Return in about 3 months (around 11/29/2021) for painful thick nails .

## 2021-09-12 ENCOUNTER — Other Ambulatory Visit (HOSPITAL_COMMUNITY): Payer: Medicare Other

## 2021-09-13 ENCOUNTER — Telehealth (HOSPITAL_COMMUNITY): Payer: Self-pay | Admitting: Cardiology

## 2021-09-13 NOTE — Telephone Encounter (Signed)
Zoll reorder cancelled Patient returned equipment ~2 weeks ago Reports with insurance and delays for ordering -3rd party co insurance will not approve. Chart updated patient declined continued use of zoll

## 2021-09-26 ENCOUNTER — Telehealth (HOSPITAL_COMMUNITY): Payer: Self-pay

## 2021-09-26 NOTE — Telephone Encounter (Signed)
Erik P. With [facility] called to obtain an auth for a PET scan scheduled for 08/25   CB#[phone]. [phone]

## 2021-10-03 ENCOUNTER — Encounter (HOSPITAL_COMMUNITY): Payer: Medicare Other

## 2021-10-11 DEATH — deceased

## 2021-10-16 ENCOUNTER — Encounter (HOSPITAL_COMMUNITY): Payer: Medicare Other

## 2021-10-29 ENCOUNTER — Encounter (HOSPITAL_COMMUNITY): Payer: Medicare Other

## 2021-12-04 ENCOUNTER — Ambulatory Visit: Payer: Medicare Other | Admitting: Podiatry

## 2024-02-01 ENCOUNTER — Other Ambulatory Visit (HOSPITAL_COMMUNITY): Payer: Self-pay
